# Patient Record
Sex: Female | Born: 1955 | Race: White | Hispanic: No | Marital: Married | Smoking: Never smoker
Health system: Southern US, Academic
[De-identification: ages and names within clinical notes are randomized; demographics above are authoritative.]

## PROBLEM LIST (undated history)

## (undated) DIAGNOSIS — E039 Hypothyroidism, unspecified: Secondary | ICD-10-CM

## (undated) DIAGNOSIS — E785 Hyperlipidemia, unspecified: Secondary | ICD-10-CM

## (undated) DIAGNOSIS — I499 Cardiac arrhythmia, unspecified: Secondary | ICD-10-CM

## (undated) DIAGNOSIS — C50911 Malignant neoplasm of unspecified site of right female breast: Secondary | ICD-10-CM

## (undated) DIAGNOSIS — C50919 Malignant neoplasm of unspecified site of unspecified female breast: Secondary | ICD-10-CM

## (undated) DIAGNOSIS — M8580 Other specified disorders of bone density and structure, unspecified site: Secondary | ICD-10-CM

## (undated) HISTORY — DX: Hyperlipidemia, unspecified: E78.5

## (undated) HISTORY — PX: HX BREAST BIOPSY: SHX20

## (undated) HISTORY — DX: Cardiac arrhythmia, unspecified: I49.9

## (undated) HISTORY — DX: Other specified disorders of bone density and structure, unspecified site: M85.80

## (undated) HISTORY — PX: HX BREAST LUMPECTOMY: SHX2

## (undated) HISTORY — DX: Hypothyroidism, unspecified: E03.9

## (undated) HISTORY — DX: Malignant neoplasm of unspecified site of right female breast: C50.911

## (undated) HISTORY — PX: HX BREAST REDUCTION: SHX8

---

## 1995-10-31 ENCOUNTER — Other Ambulatory Visit (HOSPITAL_COMMUNITY): Payer: Self-pay | Admitting: OBSTETRICS/GYNECOLOGY

## 2014-09-08 IMAGING — MG MAMMO DIGITAL SCREENING
1 series · 8 of 8 positions shown · non-contrast
Comparison: 

------------- REPORT [id number] -------------
RYABOVA, ELICAVET

MAMMO DIGITAL SCREENING WITH CAD
TIGER, BASHAIER
Exam:  
Screening digital mammogram with CAD
INDICATION: Annual screening.

[R CC · oblique · right · 8 of 11 slices shown]
[im 1/11]
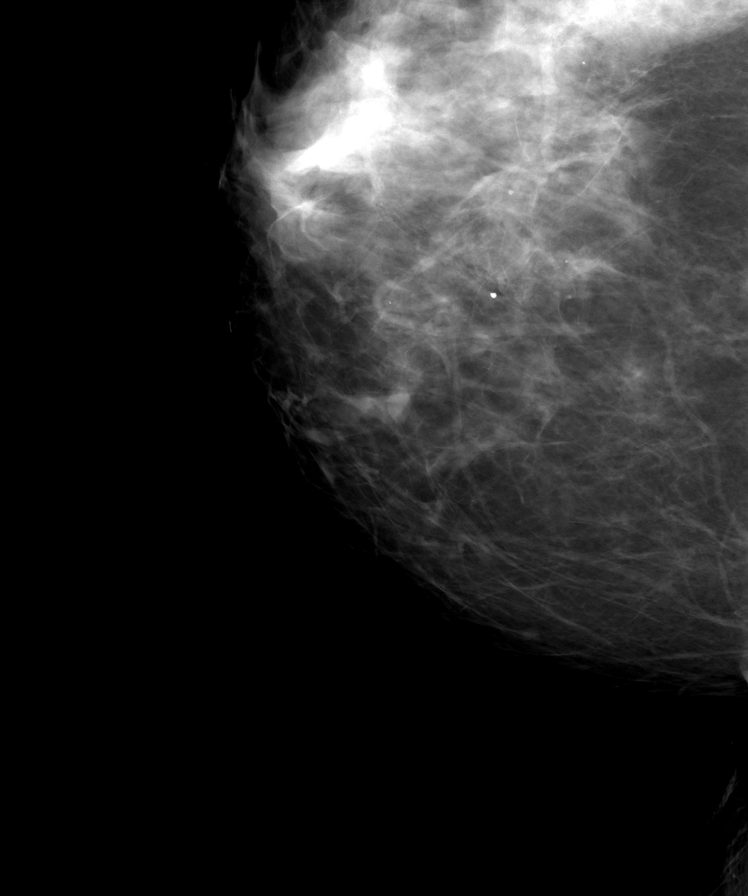
[im 2/11]
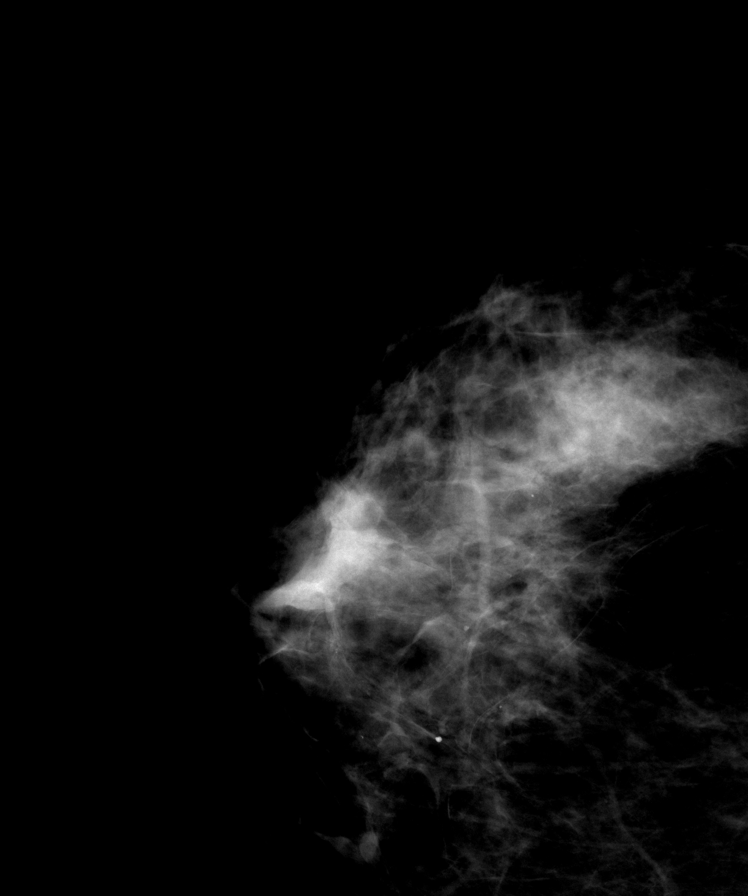
[im 3/11]
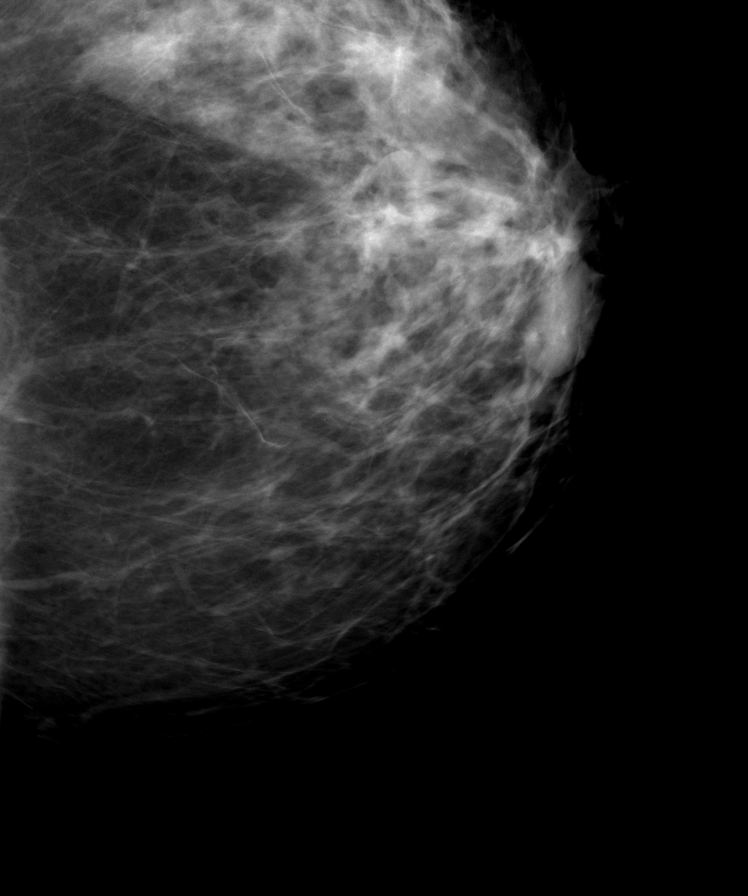
[im 5/11]
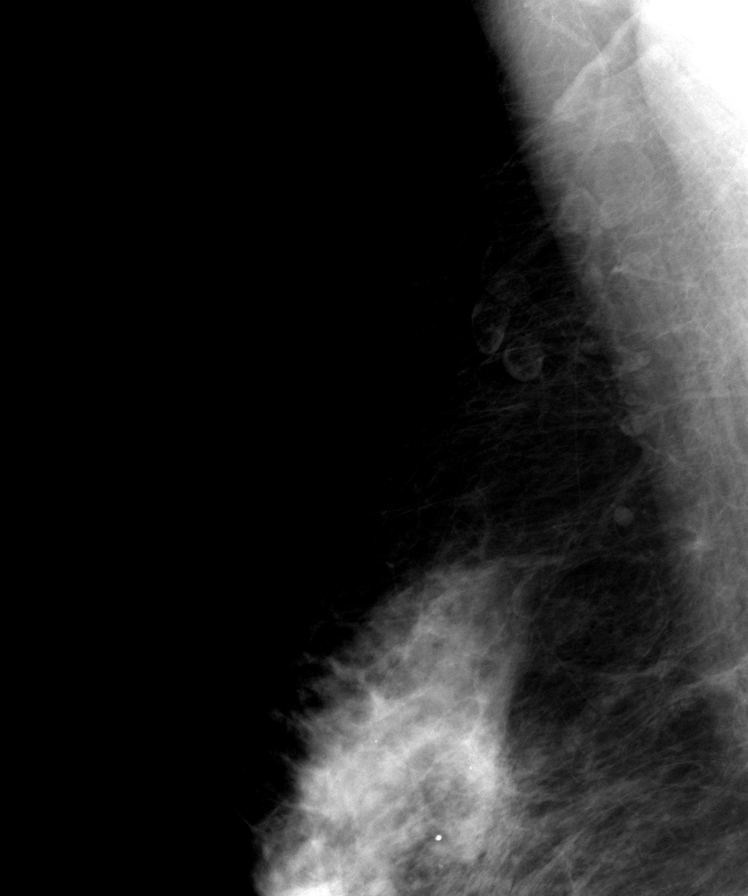
[im 6/11]
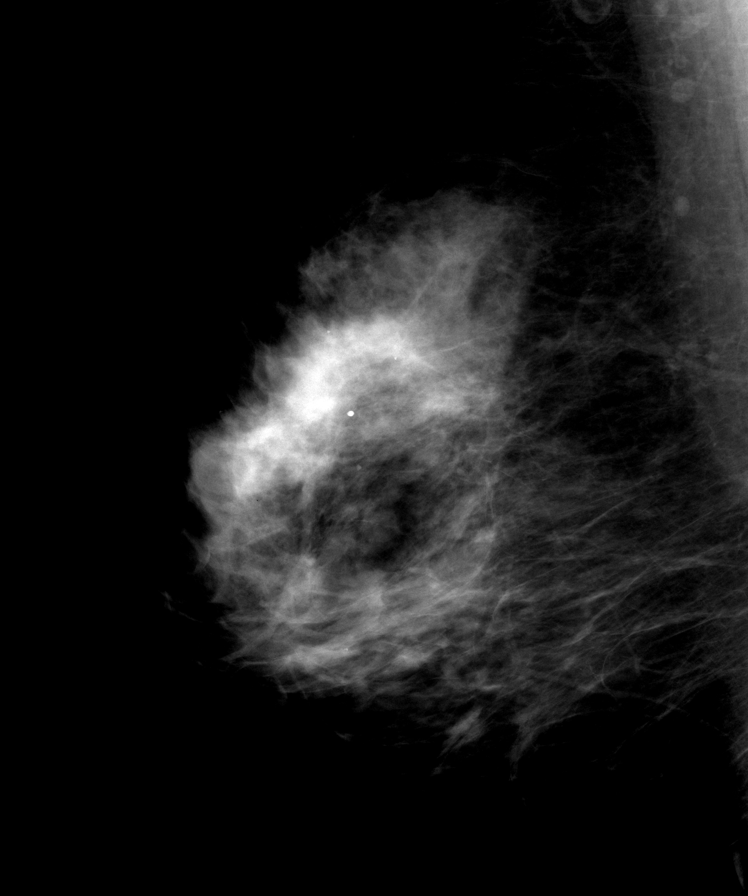
[im 8/11]
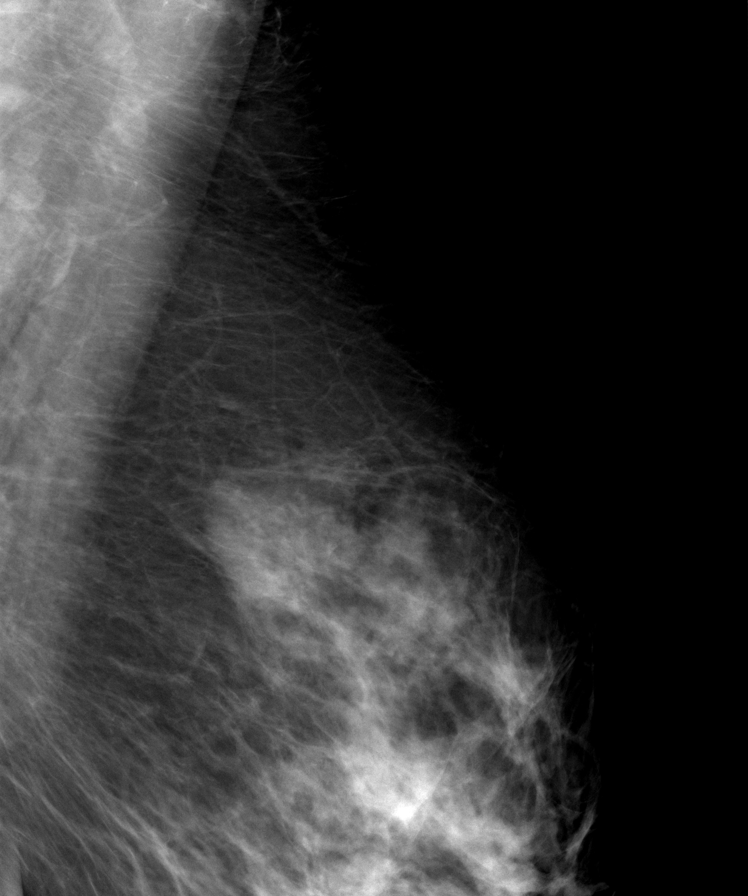
[im 9/11]
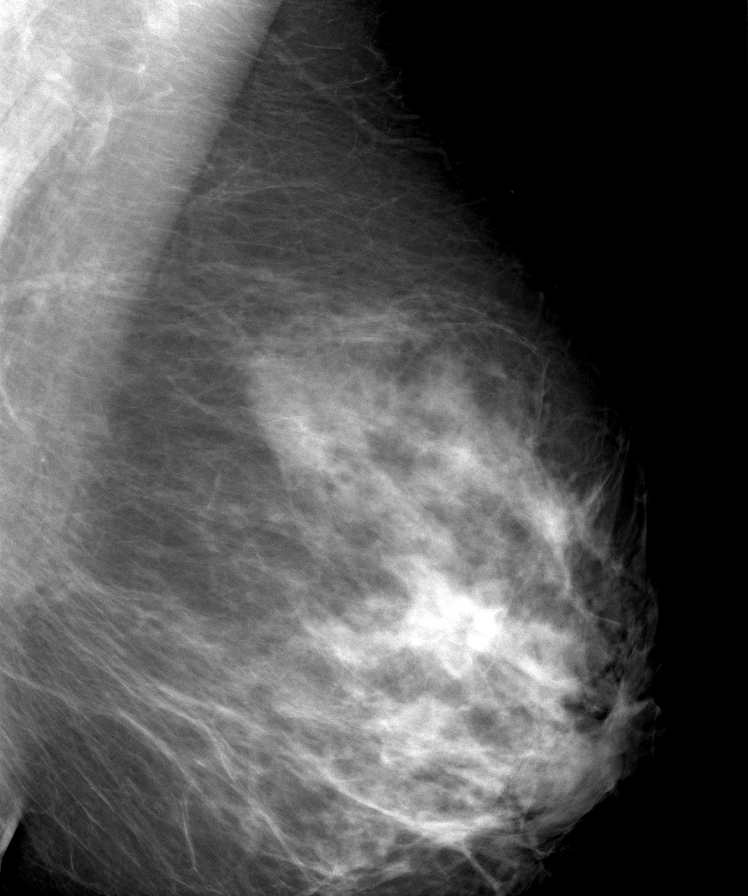
[im 11/11]
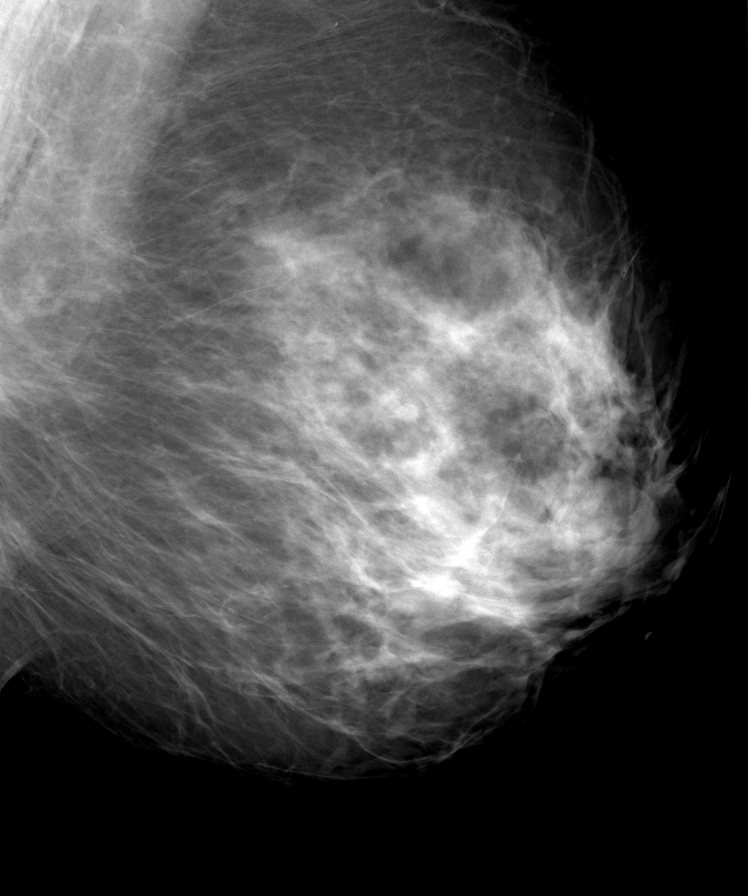

[8 of 8 positions shown; findings below may reference images not displayed]

FINDINGS: Breast parenchyma is heterogeneously dense. There is no mass or suspicious cluster of microcalcifications. There is no architectural distortion, skin thickening or nipple retraction.
IMPRESSION: 1.
BIRADS 2-Benign findings. Patient has been added in a reminder system with a
target date for the next screening mammography.
2.
DENSITY CODE   C (Heterogeneously dense)
Final Assessment Code:
Bi-Rads 2 

BI-RADS 0
Need additional imaging evaluation
BI-RADS 1
Negative mammogram
BI-RADS 2
Benign finding
BI-RADS 3
Probably benign finding: short-interval follow-up suggested
BI-RADS 4
Suspicious abnormality:  biopsy should be considered
BI-RADS 5
Highly suggestive of malignancy; appropriate action should be taken
BI-RADS 6
Known Biopsy-proven Malignancy  Appropriate action should be taken
NOTE:
In compliance with Federal regulations, the results of this mammogram are being sent to the patient.

------------- REPORT [id number] -------------
[facility]
[address]
Mor Ms.KRISHNA, MALAMA:
We wish to report the following on your recent mammography examination. We are sending a report to your referring physician or other health care provider. 
(       Normal/Negative:
No evidence of cancer.
This statement is mandated by the [place], Department of Health.
Your examination was performed by one of our technologists, who are registered radiological technologists and also specially certified in mammography:
___
Donjuan, Wavy (M)
___
Tena, Danielf (M)
___
Gia, Nelith (M)

Your mammogram was interpreted by our radiologist.

( 
Mmamontsho Seakolo, M.D.

(Annual Breast Examination by a physician or other health care provider
(Annual Mammography Screening beginning at age 40
(Monthly Breast Self Examination

## 2015-10-08 IMAGING — MG MAMMO DIGITAL SCREENING
1 series · 4 of 4 positions shown · non-contrast
Comparison: 05/22/2015.

------------- REPORT [id number] -------------
SCRIBNER, SAMEENA

GUIDI, JEDEDIAH
INDICATION: Annual.

[Series 323: R CC · oblique · right · 4 of 4 slices shown]
[im 1/4]
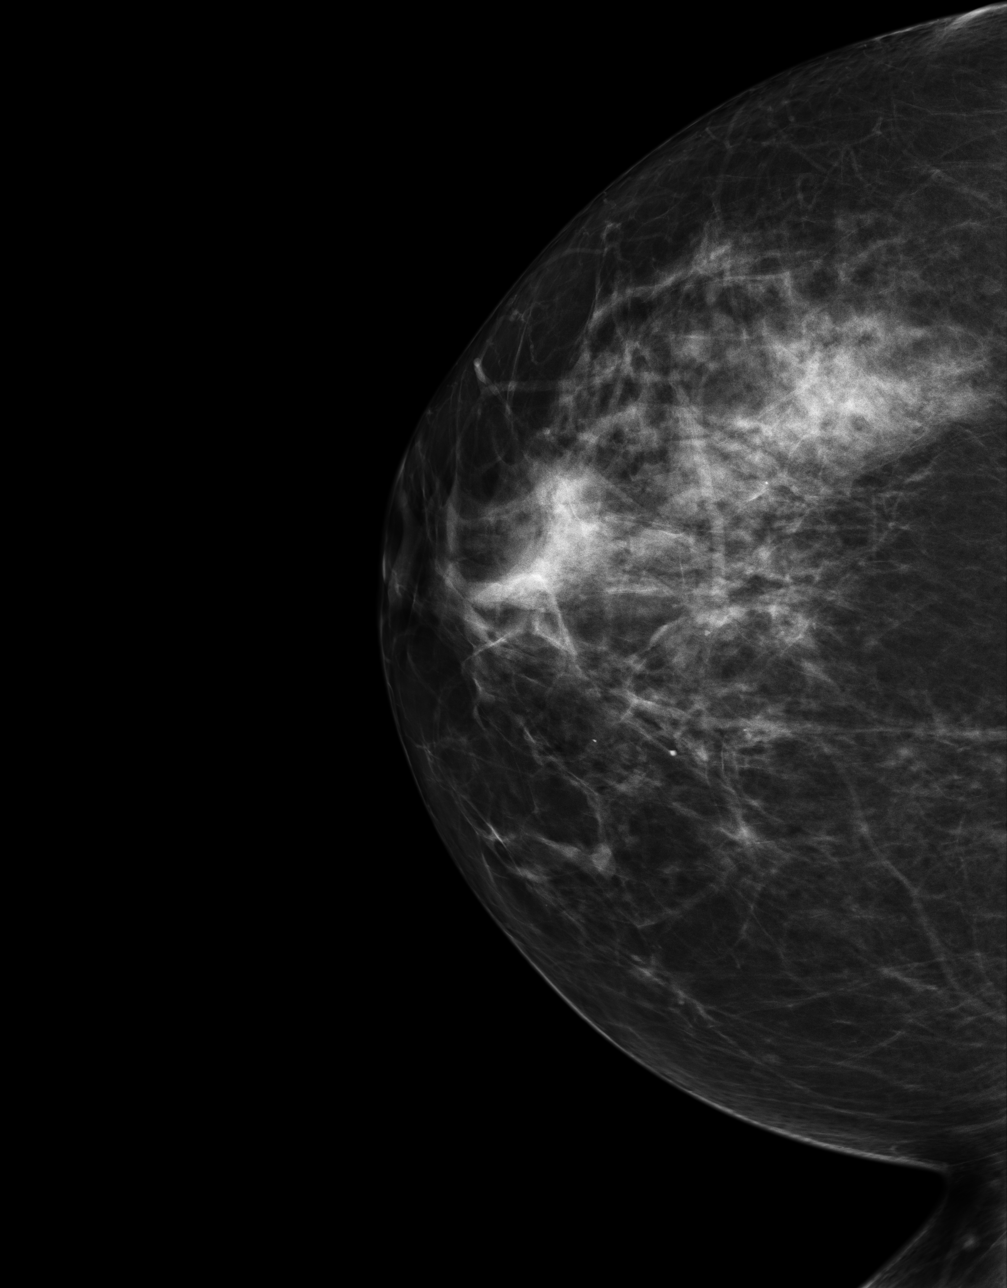
[im 2/4]
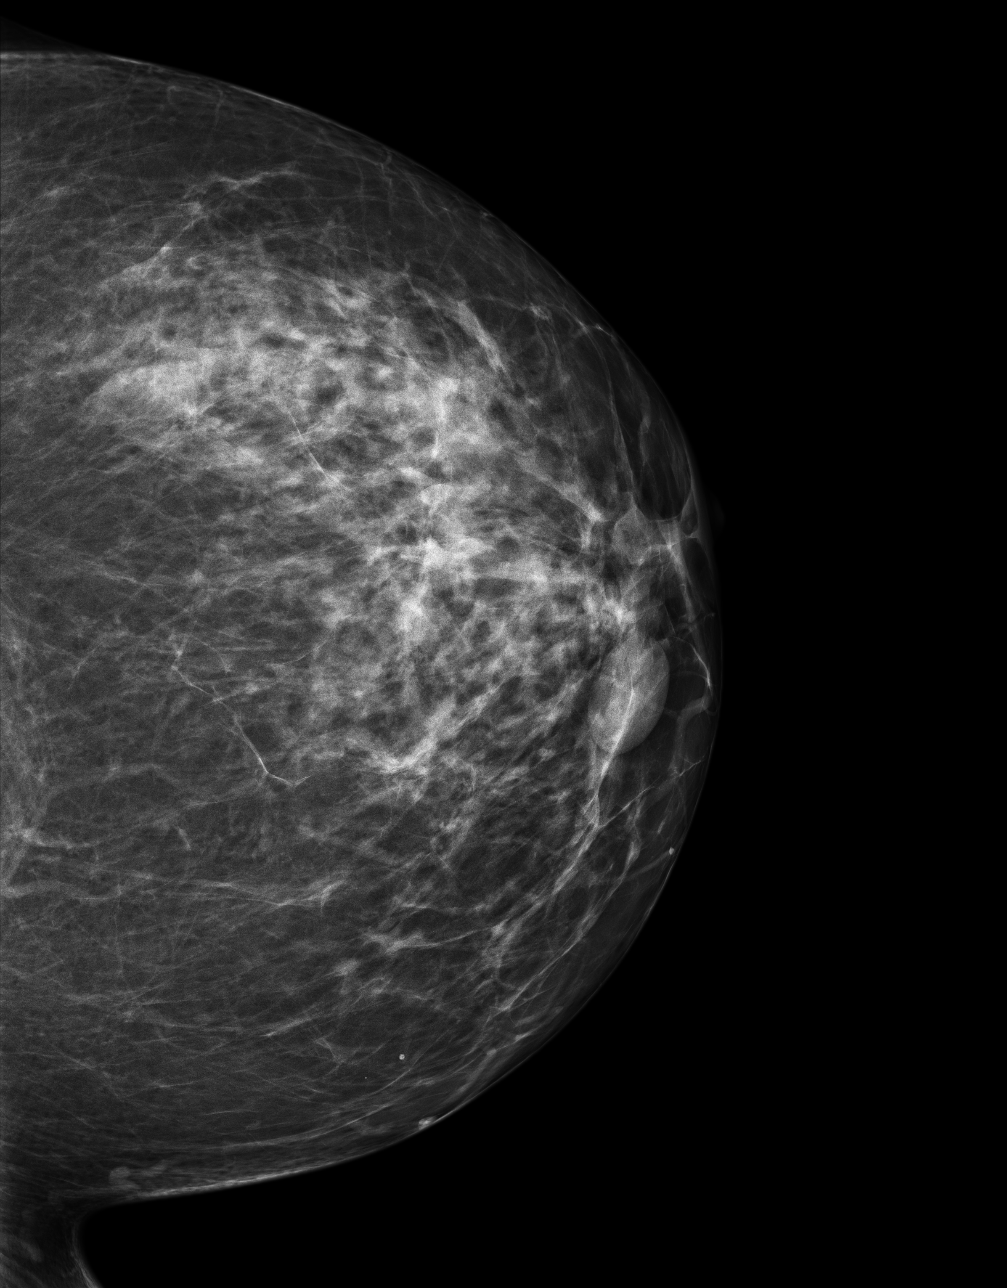
[im 3/4]
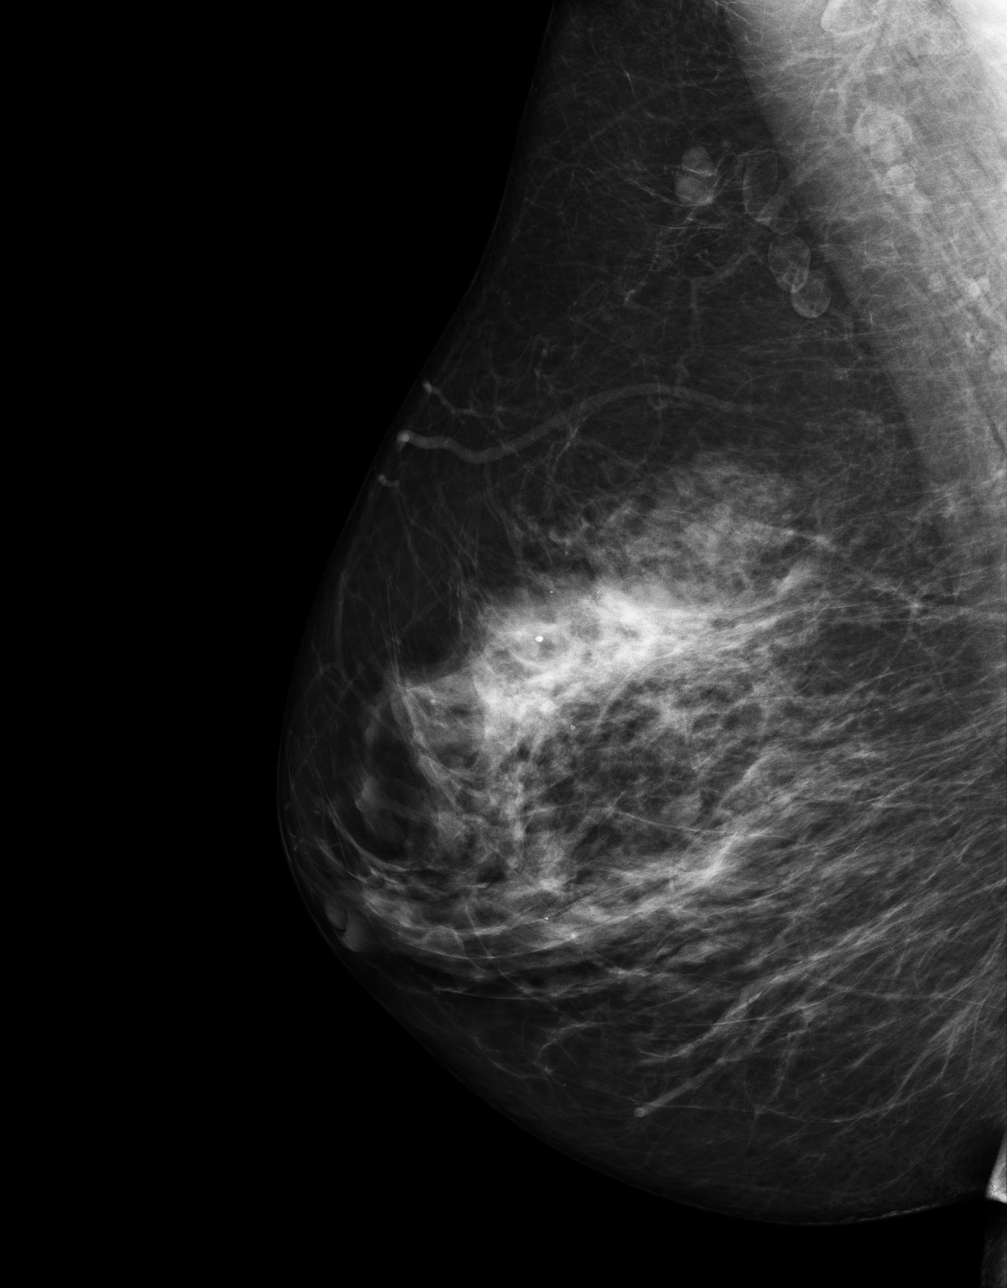
[im 4/4]
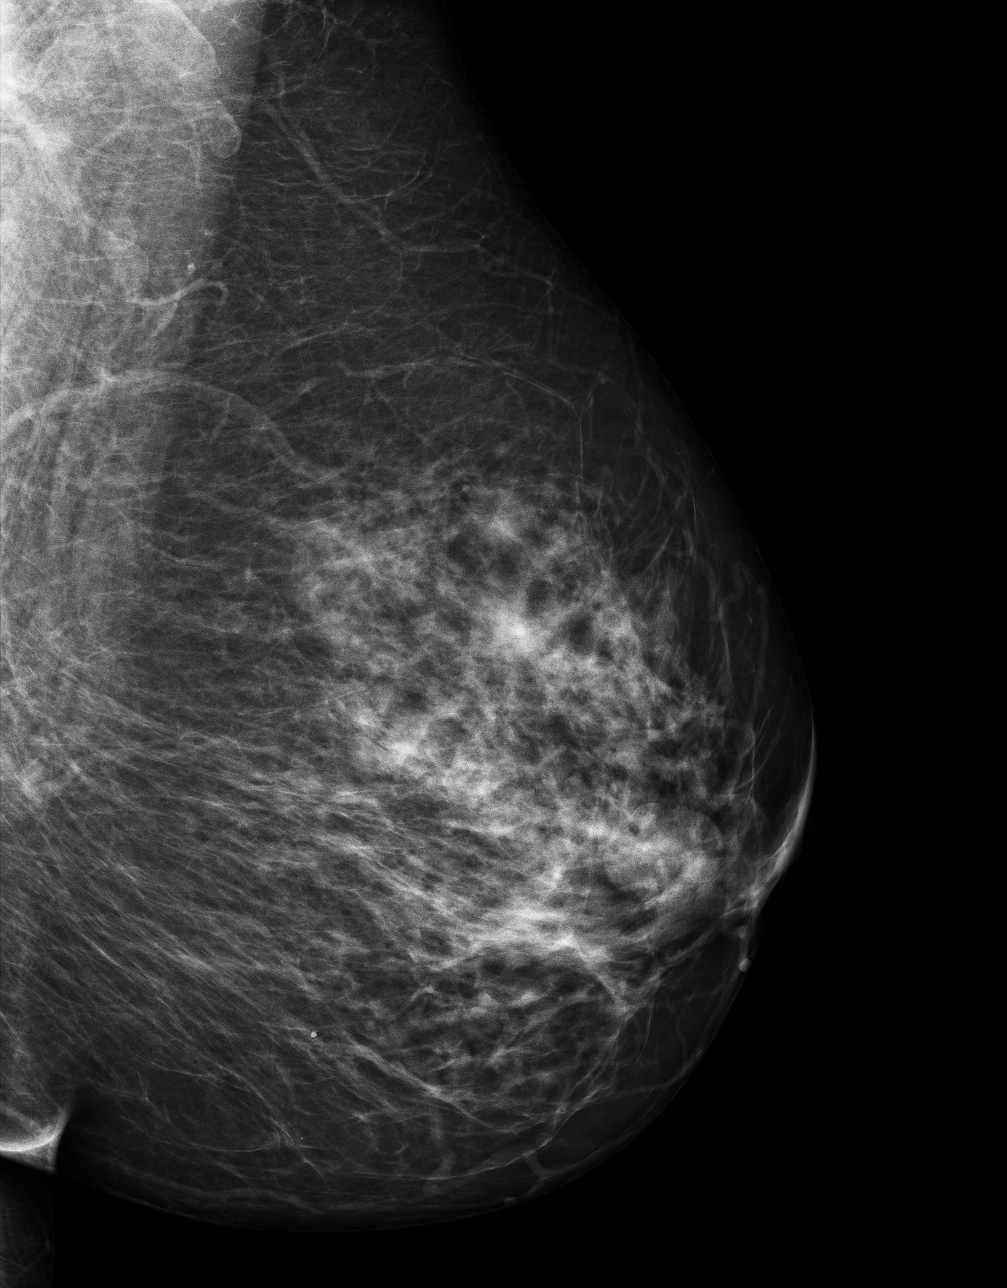

[4 of 4 positions shown; findings below may reference images not displayed]

FINDINGS: Breast parenchyma is heterogeneously dense.  There is no mass or suspicious cluster of microcalcifications.  There is no architectural distortion, skin thickening or nipple retraction.
IMPRESSION: 1.
BIRADS 2-Benign findings. Patient has been added in a reminder system with a
target date for the next screening mammography.
2.
DENSITY CODE   C (Heterogeneously dense).

Final Assessment Code:
Bi-Rads 2

BI-RADS 0
Need additional imaging evaluation
BI-RADS 1
Negative mammogram
BI-RADS 2
Benign finding
BI-RADS 3
Probably benign finding: short-interval follow-up suggested
BI-RADS 4
Suspicious abnormality:  biopsy should be considered
BI-RADS 5
Highly suggestive of malignancy; appropriate action should be taken
BI-RADS 6
Known Biopsy-proven Malignancy  Appropriate action should be taken
NOTE:
In compliance with Federal regulations, the results of this mammogram are being sent to the patient.

------------- REPORT [id number] -------------
[facility]
[address]
Daina Ms.ANOLD, PHILANI THOBO:
We wish to report the following on your recent mammography examination. We are sending a report to your referring physician or other health care provider. 
(       Normal/Negative:
No evidence of cancer.
This statement is mandated by the [place], Department of Health.
Your examination was performed by one of our technologists, who are registered radiological technologists and also specially certified in mammography:
___
Parlak, Edaly (M)
___
Dang, Mcalex (M)

Your mammogram was interpreted by our radiologist.

( 
Sofeine Made, M.D.

(Annual Breast Examination by a physician or other health care provider
(Annual Mammography Screening beginning at age 40
(Monthly Breast Self Examination

## 2016-10-10 IMAGING — MG 3D SCREENING MAMMO BIL W/CAD
5 series · 7 of 24 positions shown · non-contrast
Comparison: 06/30/2017 and 05/31/2016.

------------- REPORT [id number] -------------
JUSINO, HARUKA

Exam:  
Annual screening digital mammogram with 3D Tomosynthesis with CAD
INDICATION: Screening.

[R CC · right · 0.10mm/px · 2 of 3 slices shown]
[im 1/3]
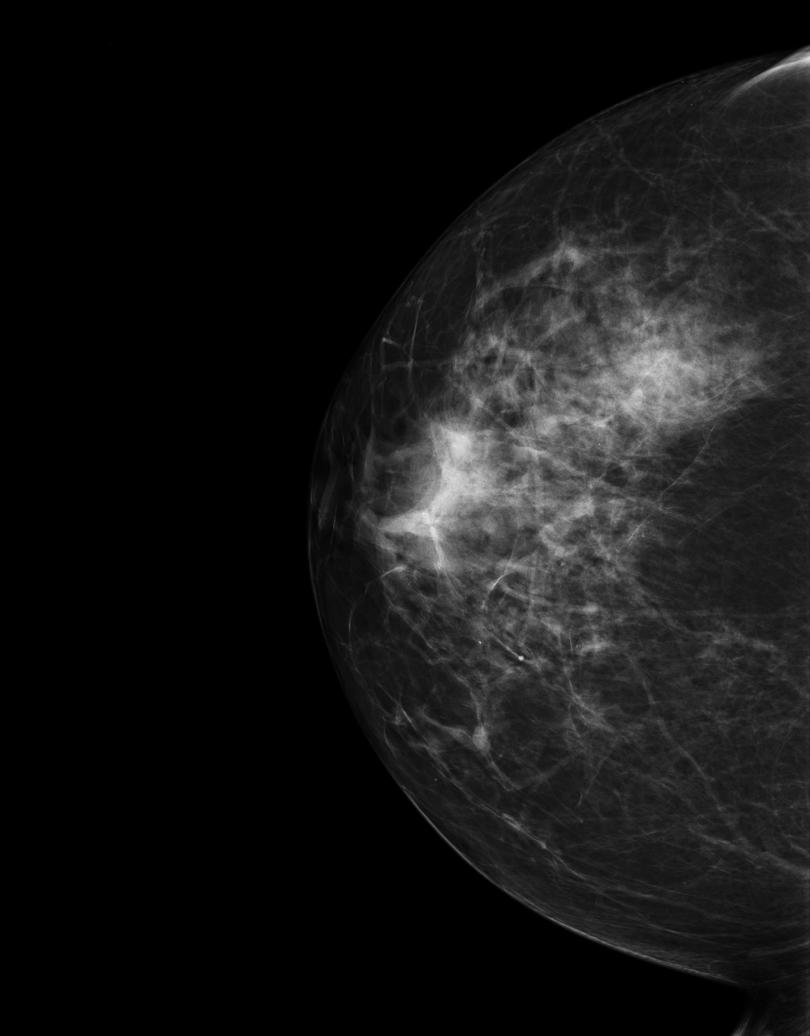
[im 3/3]
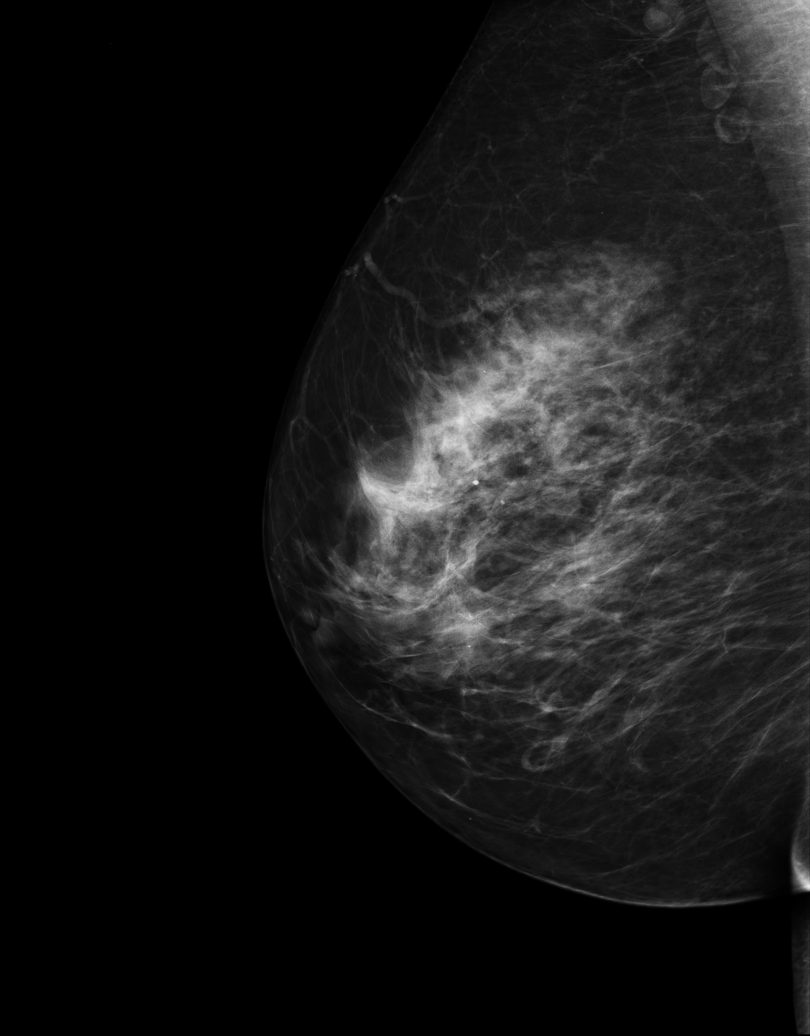

[3D SCREENING MAMMO BIL W/CAD · 2 acquisitions, 2 frames shown (1 of 2)]
[im 1/2]
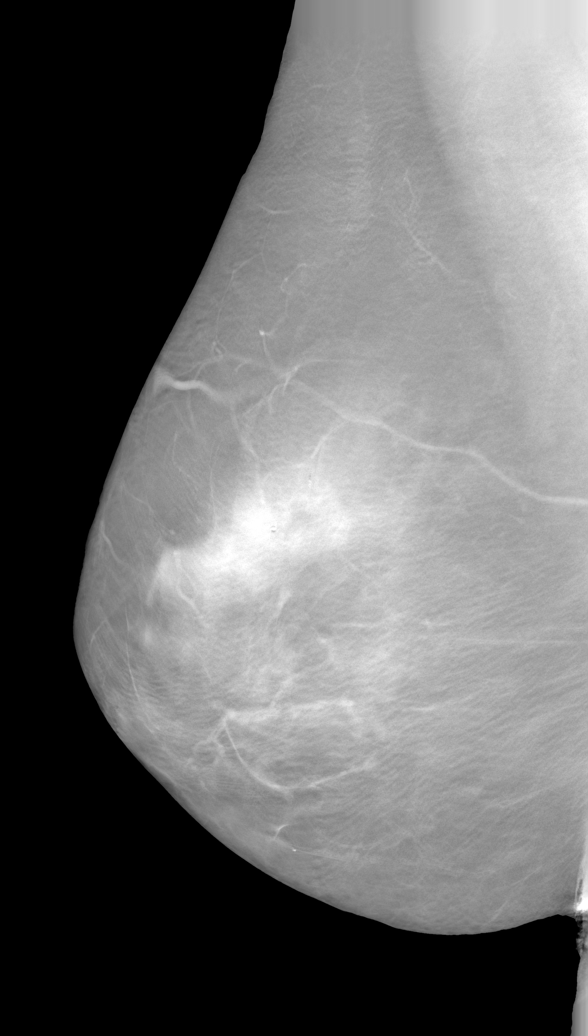
[im 2/2]
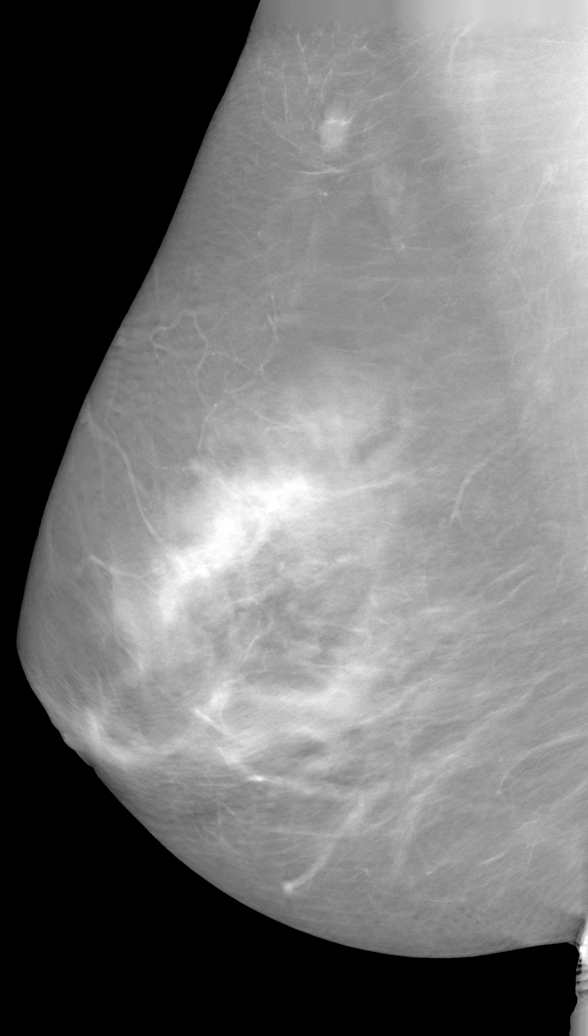

[R]
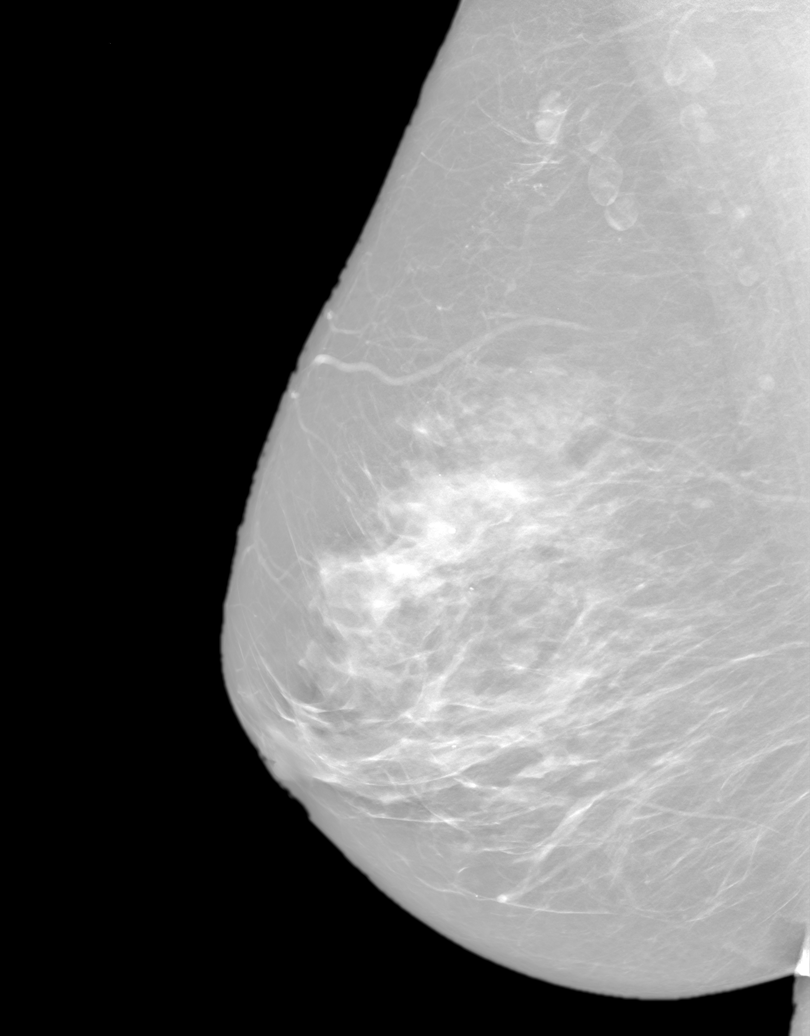

[3D SCREENING MAMMO BIL W/CAD (2 of 2) · tomo slice 16/102.0]
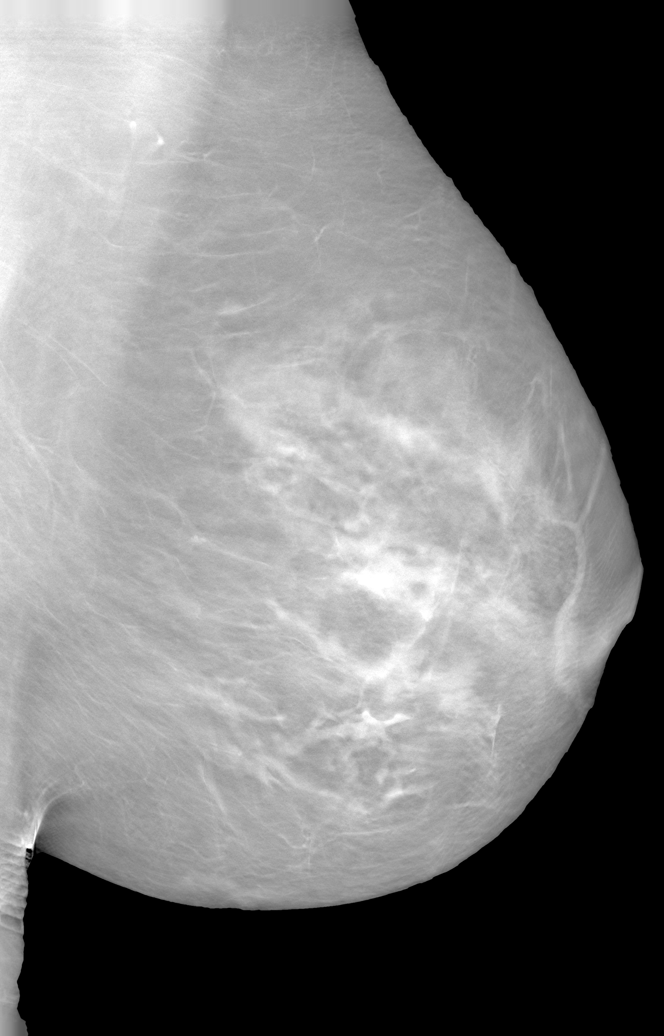

[L]
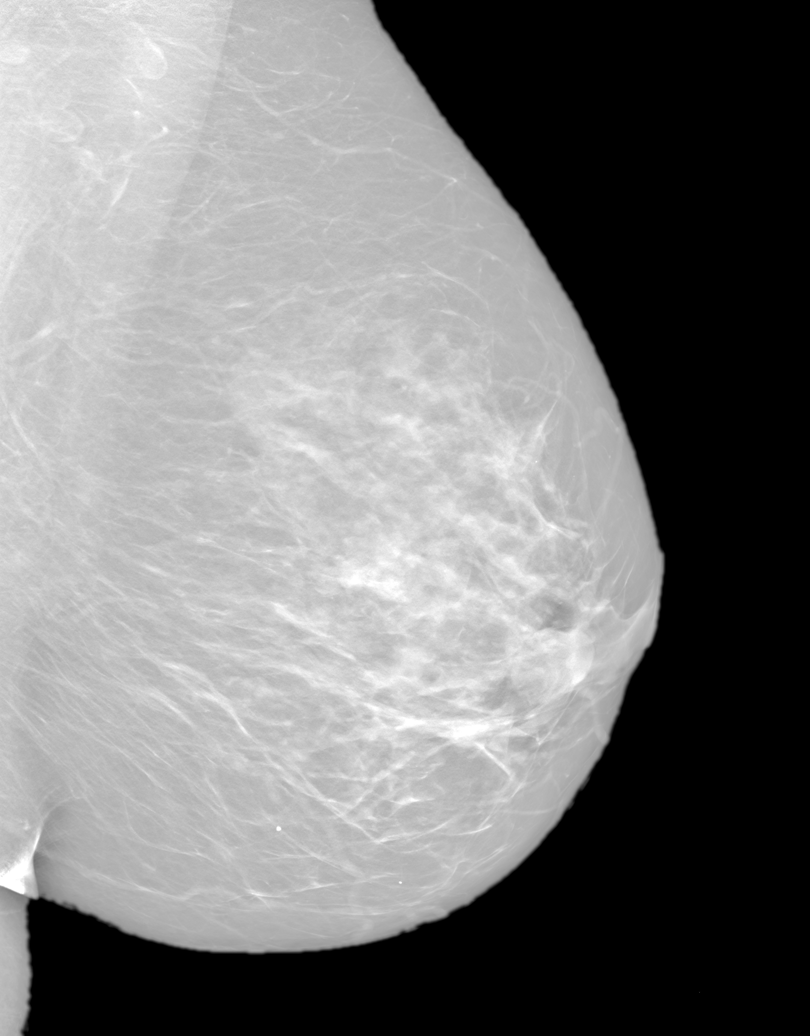

[7 of 24 positions shown; findings below may reference images not displayed]

FINDINGS: Breast parenchyma is heterogeneously dense. There is no mass or suspicious cluster of microcalcifications. There is no architectural distortion, skin thickening or nipple retraction.
IMPRESSION: BIRADS 2-Benign findings. Patient has been added in a reminder system with a

target date for the next screening mammography.

DENSITY CODE –  C (Heterogeneously dense). 

Final Assessment Code:

Bi-Rads 2 

BI-RADS 0
Need additional imaging evaluation

BI-RADS 1
Negative mammogram

BI-RADS 2
Benign finding

BI-RADS 3
Probably benign finding: short-interval follow-up suggested

BI-RADS 4
Suspicious abnormality:  biopsy should be considered

BI-RADS 5
Highly suggestive of malignancy; appropriate action should be taken

BI-RADS 6
Known Biopsy-proven Malignancy – Appropriate action should be taken

NOTE:
In compliance with Federal regulations, the results of this mammogram are being sent to the patient.

------------- REPORT [id number] -------------
[facility]
[address]
Daina Ms.ANOLD, PHILANI THOBO:
We wish to report the following on your recent mammography examination. We are sending a report to your referring physician or other health care provider. 
(       Normal/Negative:
No evidence of cancer.
This statement is mandated by the [place], Department of Health.
Your examination was performed by one of our technologists, who are registered radiological technologists and also specially certified in mammography:
___
Parlak, Edaly (M)
___
Dang, Mcalex (M)

Your mammogram was interpreted by our radiologist.

( 
Sofeine Made, M.D.

(Annual Breast Examination by a physician or other health care provider
(Annual Mammography Screening beginning at age 40
(Monthly Breast Self Examination

## 2017-10-12 IMAGING — MG 3D SCREENING MAMMO BIL W/CAD
6 series · 6 of 24 positions shown · non-contrast
Comparison: 07/03/2017 and 06/30/2016.

------------- REPORT [id number] -------------
PATIENT REGISTRATION FORM

Scheduled Modality:
MG
Date Scheduled:
Referring Physician: 
PATIENT INFORMATION
 Mr.
 Mrs.
 Miss  Ms.
Email address:
[place], [place]
Social Security:  
Age:
Sex:
F
Mailing Address:
Home Phone
Cell Phone 
Study Type:
Diagnosis / Symptoms:
Insurance Authorization:
prev [facility] [place] routine
Notes/Special Instructions: 
INSURANCE INFORMATION
(Please give your insurance card to the receptionist.)
Name of  primary insurance
[employer] [place]
Subscriber’s S.S. #
Group #
Insurance ID # 
Co-payment:
$
Patient’s relationship to subscriber:
 Self
 Spouse
 Child
 Other
Name of secondary insurance (if applicable):
Insurance ID #
IN CASE OF EMERGENCY
Name of friend or relative to call in case of emergency:
Relationship to patient:
Home phone #
Work phone #
The above information is true to the best of my knowledge. I authorize my insurance benefits be paid directly to the physician. I understand that I am financially responsible for any balance. I also authorize [employer] or insurance company to release any information required to process my claims.
Patient/Guardian signature
Date
------------- REPORT [id number] -------------
[facility]
[address]
Tiger Ms.TIDONA, BOCCIA:
We wish to report the following on your recent mammography examination. We are sending a report to your referring physician or other health care provider. 
(       Abnormal: There is a finding on your mammogram that requires further tests for a more thorough evaluation. You should contact your referring physician as soon as possible (if you have not already done so).
This statement is mandated by the [place], Department of Health.
Your examination was performed by one of our technologists, who are registered radiological technologists and also specially certified in mammography:
___
Ekram, Deare (M)
Ballou, Nilam (M)
Your mammogram was interpreted by our radiologist.
( 
Maria Elena Tomas, M.D.
(Annual Breast Examination by a physician or other health care provider
(Annual Mammography Screening beginning at age 40
(Monthly Breast Self Examination
------------- REPORT [id number] -------------
DINGANGA, MANUEL JAMBA
EXAM:  3D BILATERAL ANNUAL SCREENING DIGITAL MAMMOGRAM WITH TOMOSYNTHESIS AND CAD
INDICATION: Screening.

[R CC]
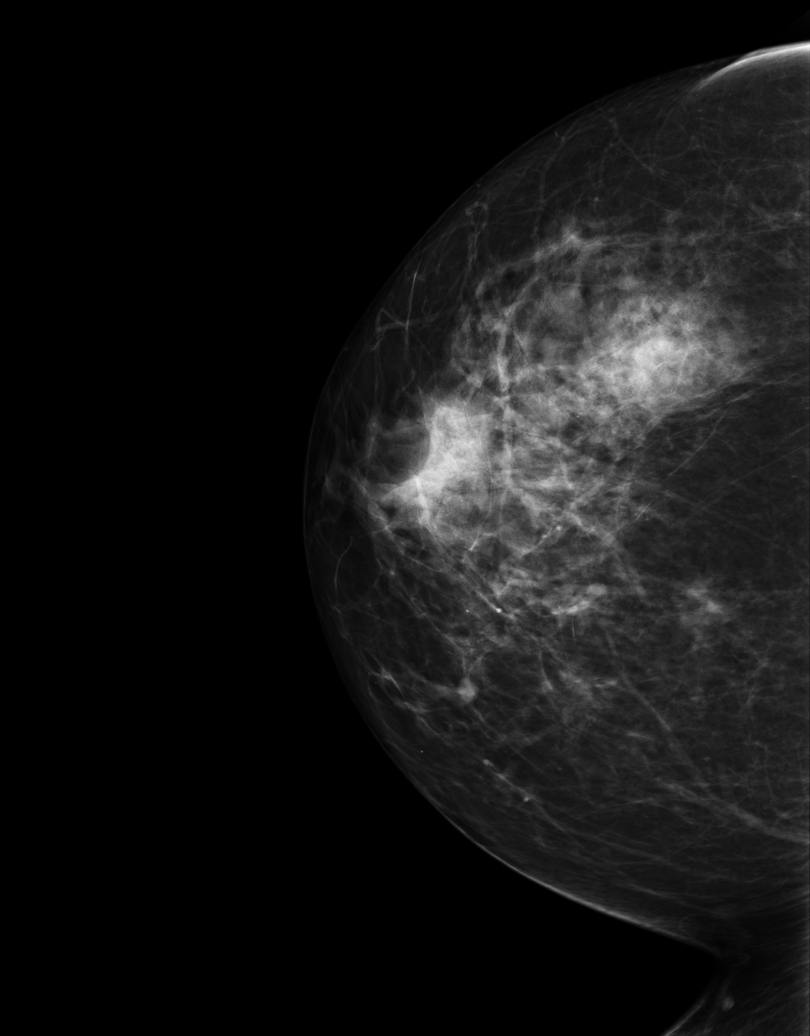

[3D SCREENING MAMMO BIL W/CAD (1 of 2) · tomo slice 15/94.0]
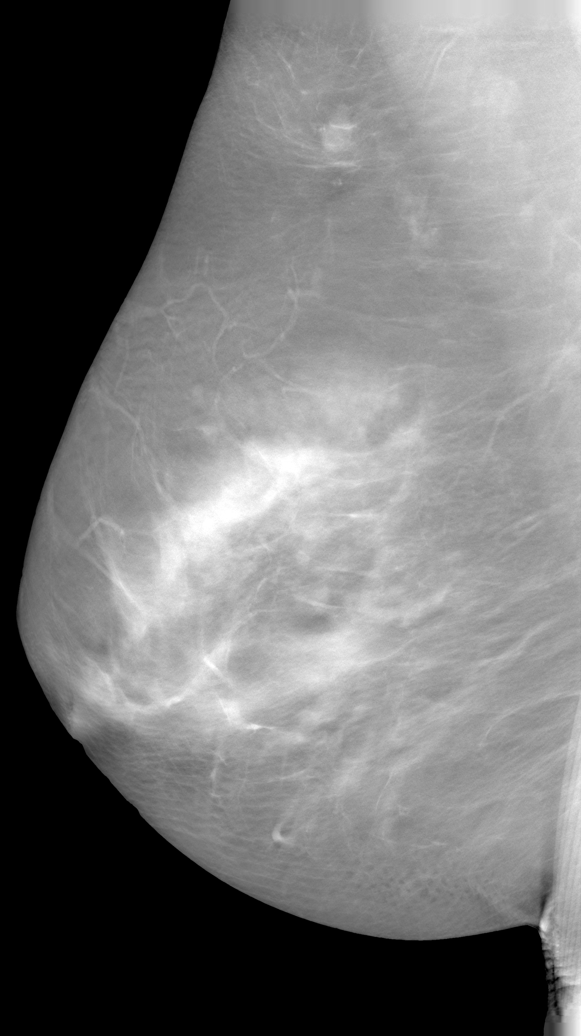

[R]
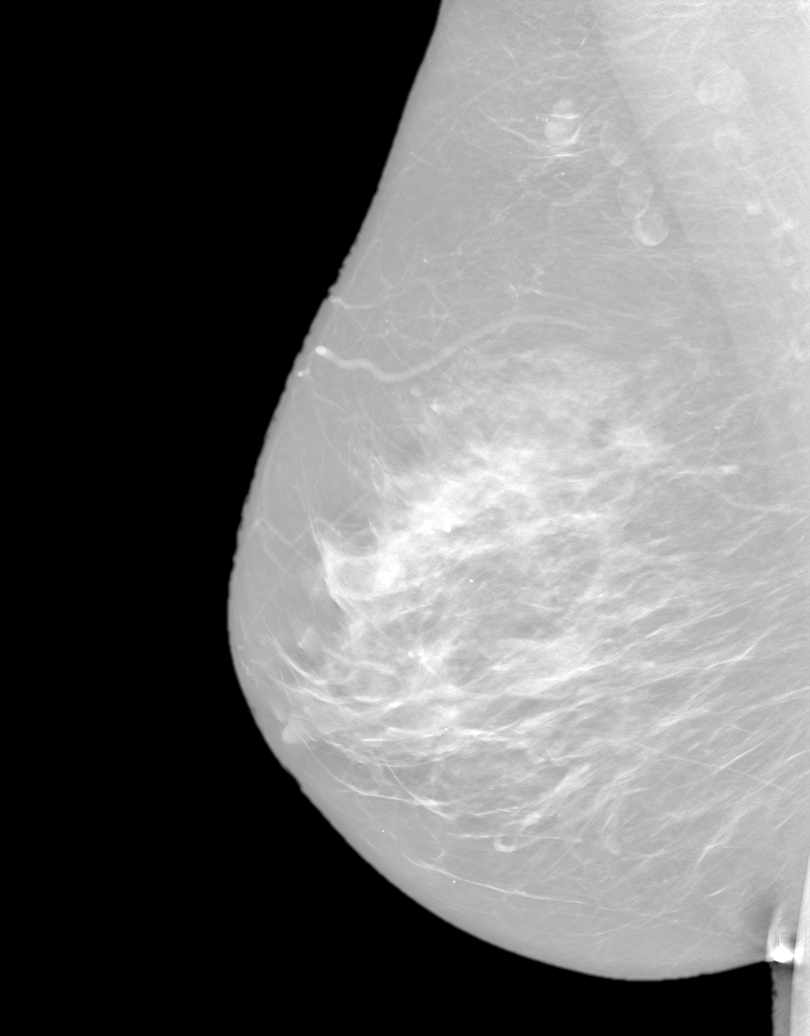

[3D SCREENING MAMMO BIL W/CAD (2 of 2) · tomo slice 16/103.0]
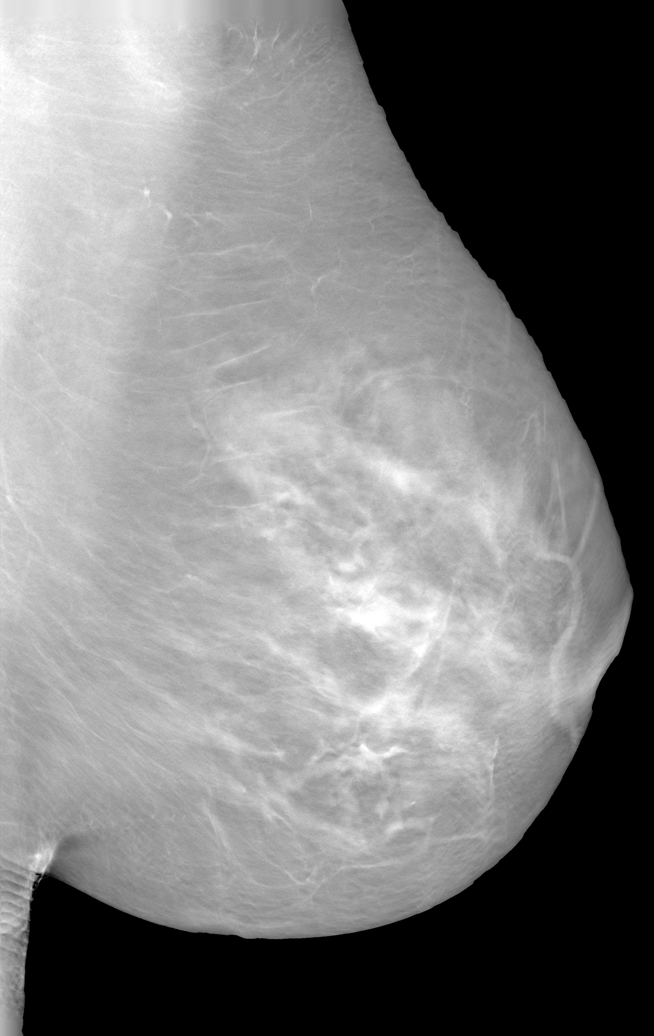

[L]
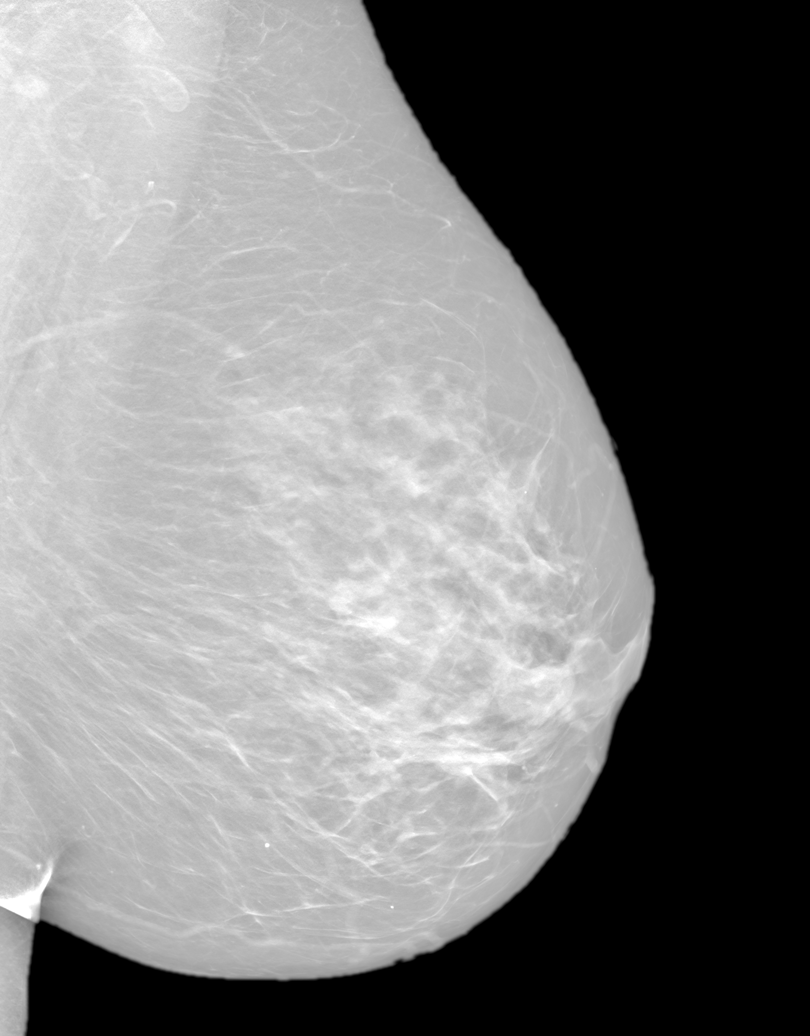

[R MLO]
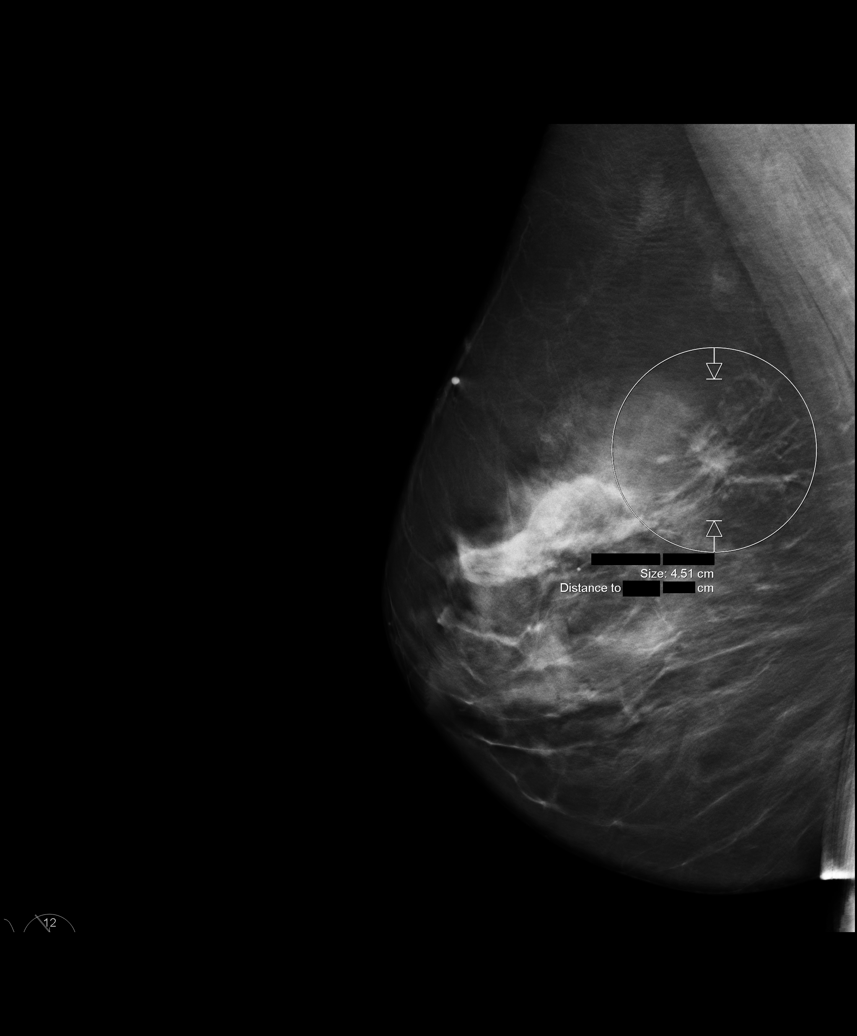

[6 of 24 positions shown; findings below may reference images not displayed]

FINDINGS: Breast parenchyma is heterogeneously dense.  There is suggestion of a new area of architectural distortion within the posterior superior right breast, visualized only on the MLO projection (image 58).  There is no suspicious cluster of microcalcifications, skin thickening or nipple retraction.
IMPRESSION: 1.  BIRADS 0-Need additional imaging evaluation.  Suggestion of a new area of architectural distortion within the posterior superior right breast.  Patient will be called back for further evaluation with spot compression views and ultrasound. 

2.  DENSITY CODE – C (Heterogeneously dense). 

Final Assessment Code:

Bi-Rads 0 

BI-RADS 0
Need additional imaging evaluation

BI-RADS 1
Negative mammogram

BI-RADS 2
Benign finding

BI-RADS 3
Probably benign finding: short-interval follow-up suggested

BI-RADS 4
Suspicious abnormality:  biopsy should be considered

BI-RADS 5
Highly suggestive of malignancy; appropriate action should be taken

BI-RADS 6
Known Biopsy-proven Malignancy – Appropriate action should be taken

NOTE:
In compliance with Federal regulations, the results of this mammogram are being sent to the patient.

## 2017-10-19 IMAGING — US ULTRASOUND BREAST
1 series · 13 of 23 positions shown · non-contrast
Comparison: Mammogram dated 10/12/2017.

MED, SAYDA

EXAM:  2D RIGHT DIAGNOSTIC DIGITAL MAMMOGRAM WITH CAD 
EXAM:  COMPLETE RIGHT BREAST ULTRASOUND
INDICATION: Call back for a new area of architectural distortion within the posterior superior right breast.

[Series 1: ultrasound breast · 0.11mm/px · 13 of 23 slices shown]
[im 1/23]
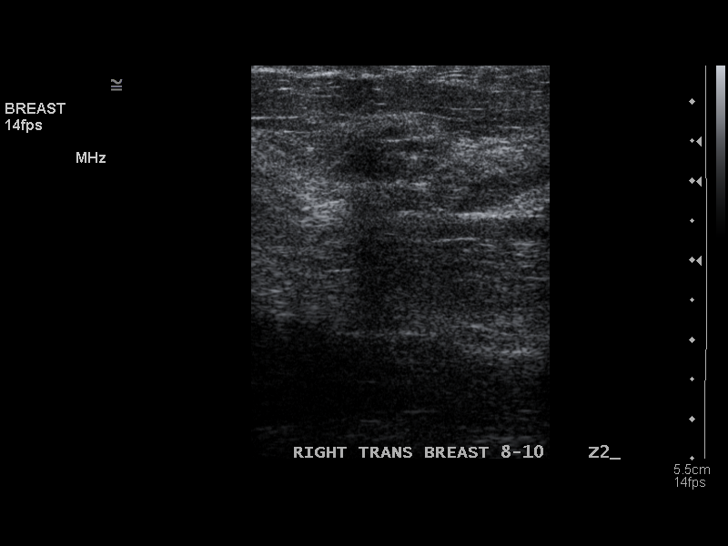
[im 3/23]
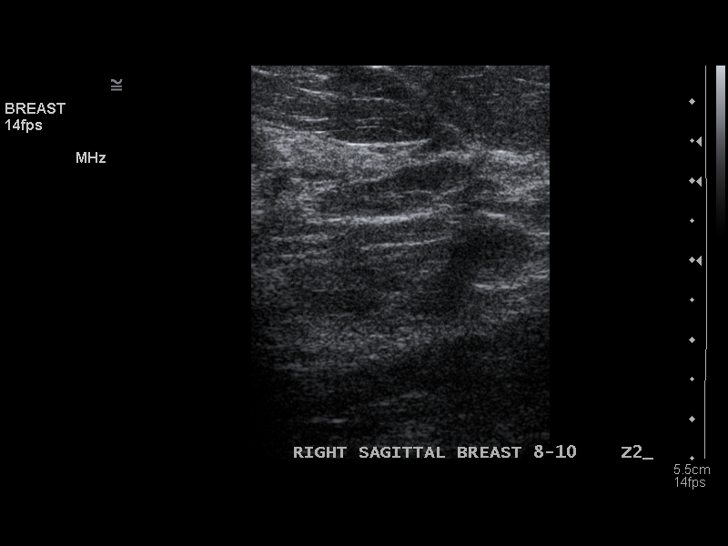
[im 5/23]
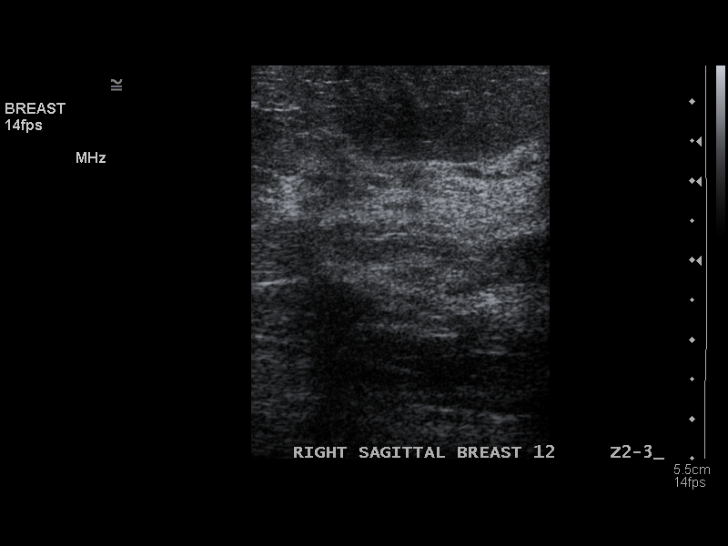
[im 7/23]
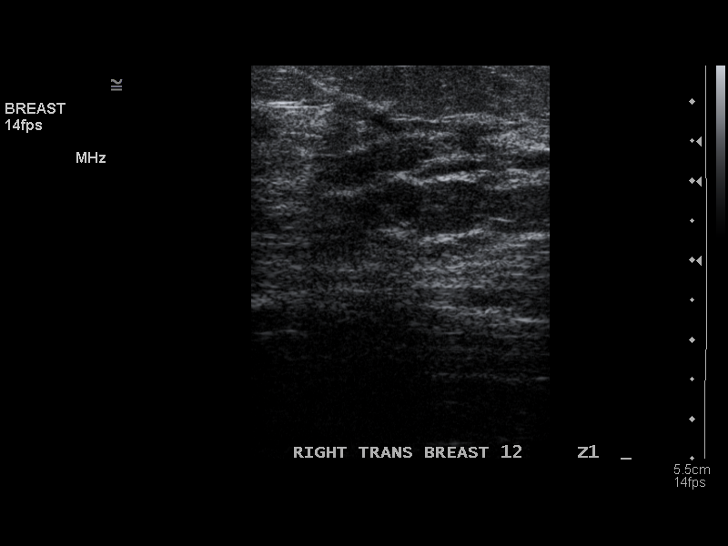
[im 8/23]
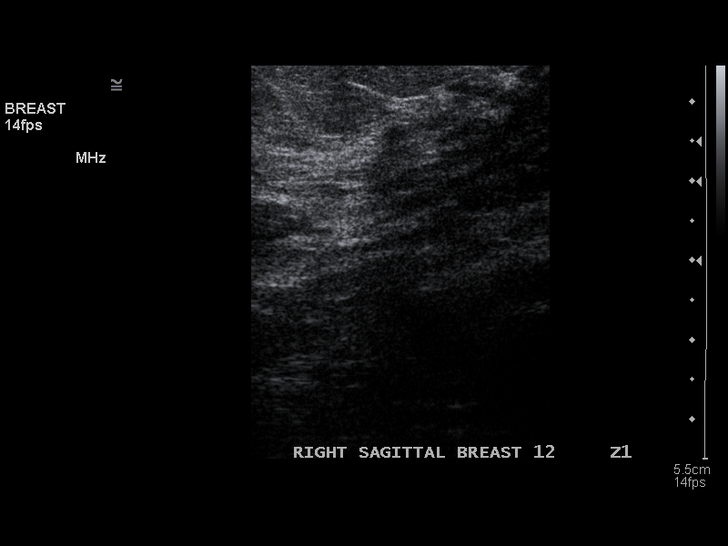
[im 10/23]
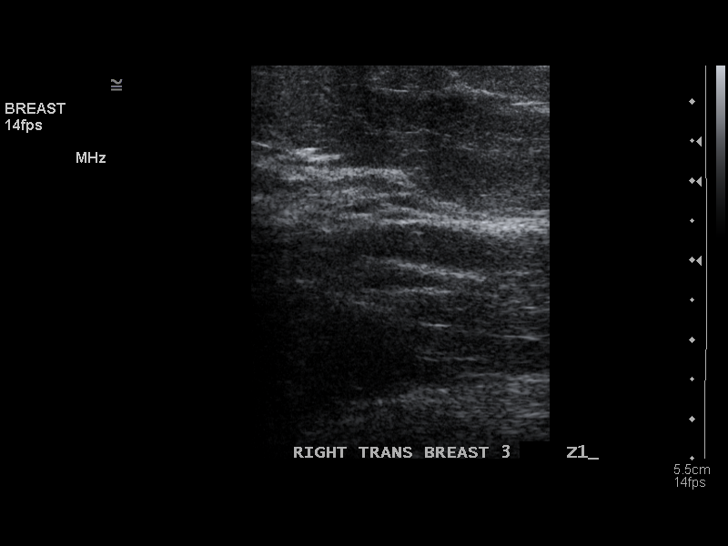
[im 12/23]
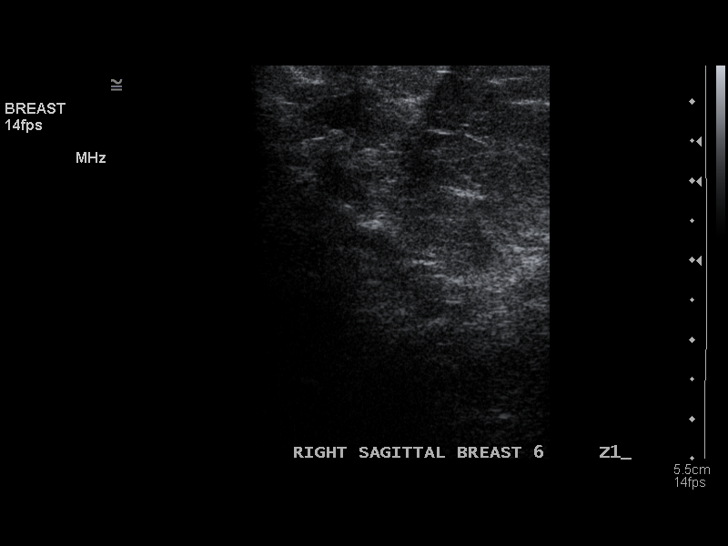
[im 14/23]
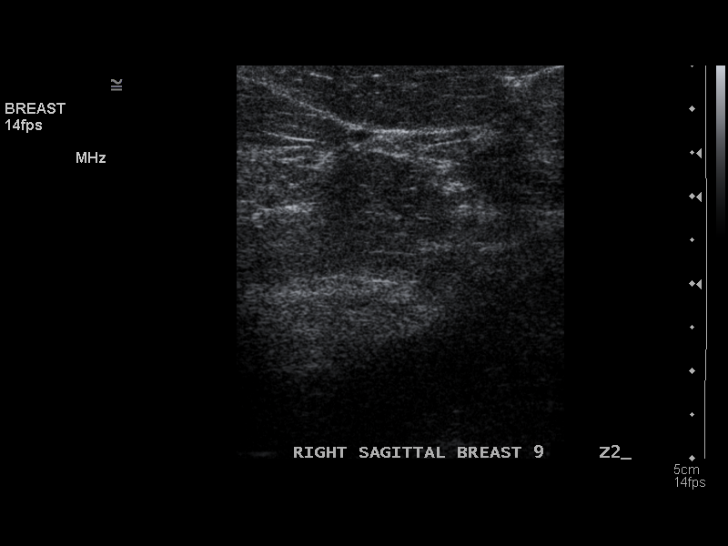
[im 16/23]
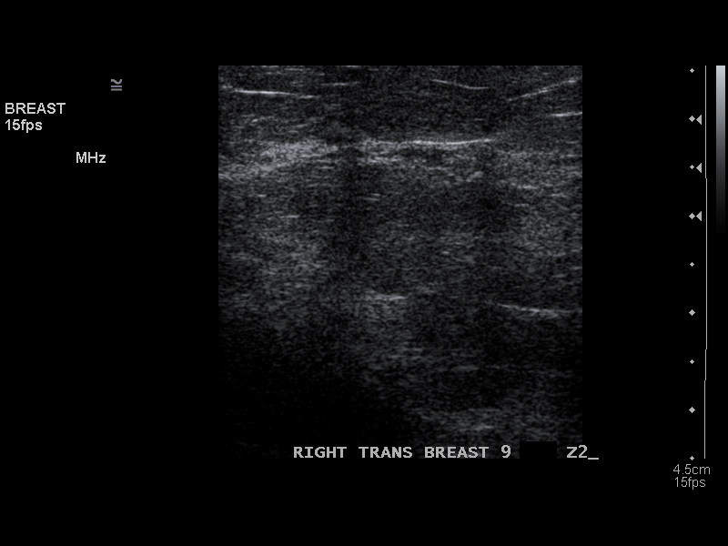
[im 17/23]
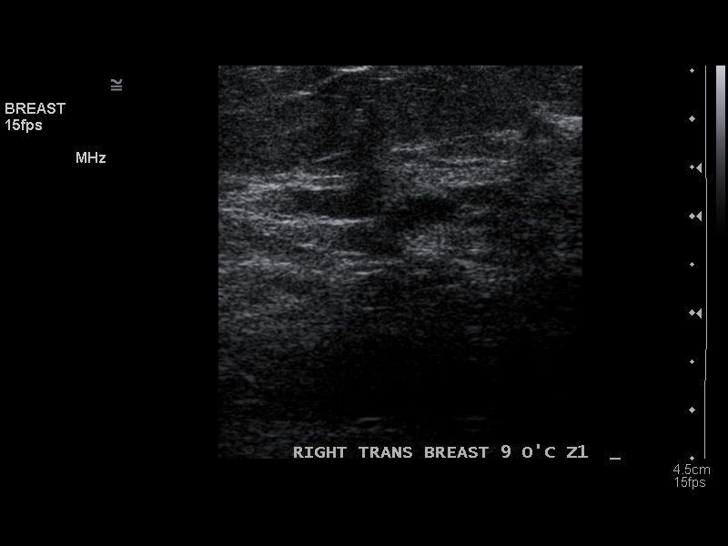
[im 19/23]
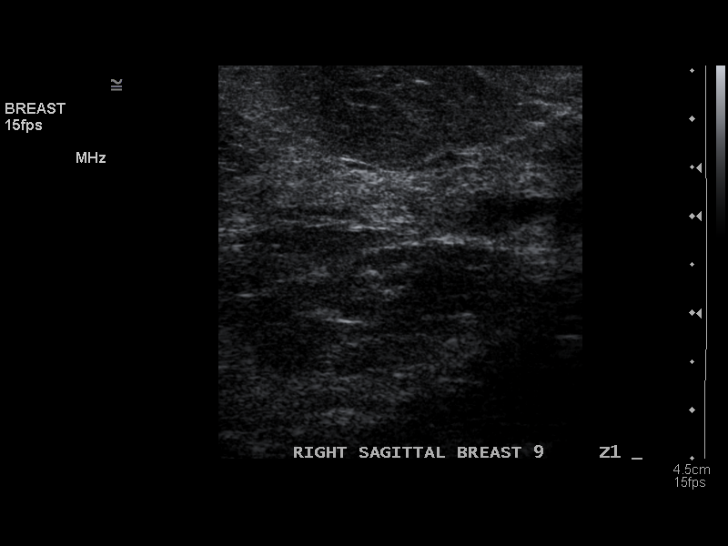
[im 21/23]
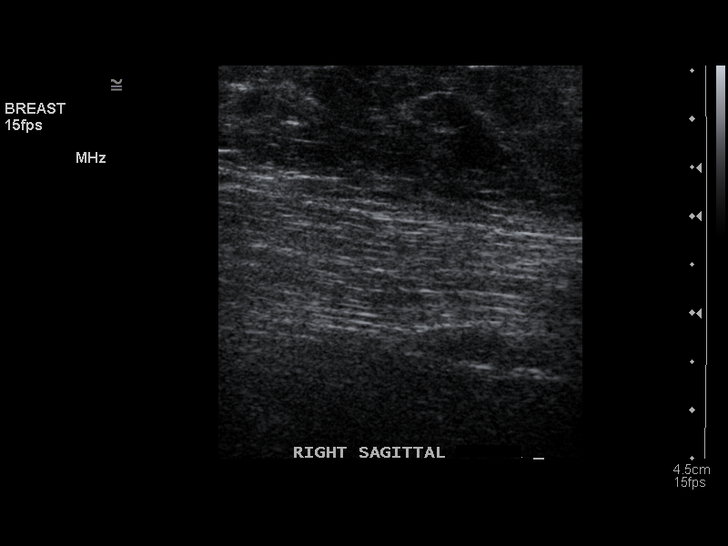
[im 23/23]
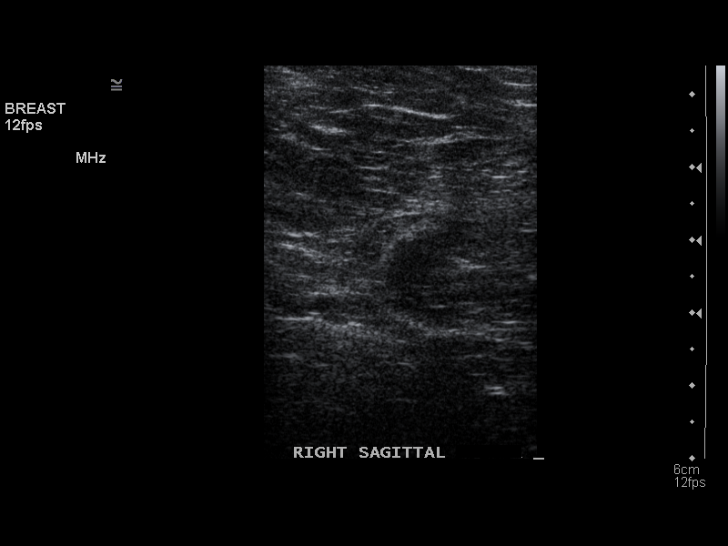

[13 of 23 positions shown; findings below may reference images not displayed]

FINDINGS: Spot compression view of the superior right breast was obtained in MLO projection.  Previously noted area of architectural distortion is less conspicuous.  Breast parenchyma is heterogeneously dense.  

A comprehensive sonographic evaluation of the right breast was also performed and representative images of all four quadrants, retroareolar and axillary regions were obtained.  There is no suspicious solid or cystic mass.  There is no axillary adenopathy.
IMPRESSION: 1.  BIRADS 3-Probably benign findings.  Six-month follow-up right diagnostic mammogram is recommended for reassessment. 

2.  DENSITY CODE –  C (Heterogeneously dense).

Final Assessment Code:

Bi-Rads 3 

BI-RADS 0
Need additional imaging evaluation

BI-RADS 1
Negative mammogram

BI-RADS 2
Benign finding

BI-RADS 3
Probably benign finding: short-interval follow-up suggested

BI-RADS 4
Suspicious abnormality:  biopsy should be considered

BI-RADS 5
Highly suggestive of malignancy; appropriate action should be taken

BI-RADS 6
Known Biopsy-proven Malignancy – Appropriate action should be taken

NOTE:
In compliance with Federal regulations, the results of this mammogram are being sent to the patient.

## 2017-10-19 IMAGING — MG 2D DX MAMMO UNI W/CAD
1 series · 1 of 1 positions shown · non-contrast
Comparison: Mammogram dated 04/08/2019.

------------- REPORT [id number] -------------
PATIENT REGISTRATION FORM

Scheduled Modality:
MG
Date Scheduled:
Referring Physician: 
PATIENT INFORMATION
 Mr.
 Mrs.
 Miss  Ms.
Email address:
[place], [place]
Social Security:  
Age:
Sex:
F
Mailing Address:
Home Phone
Cell Phone 
Study Type:
Diagnosis / Symptoms:
Insurance Authorization:
[employer]DITIONAL VIEWS AND RIGHT BREAST
Notes/Special Instructions: 
INSURANCE INFORMATION
(Please give your insurance card to the receptionist.)
Name of  primary insurance
[employer] [place]
Subscriber’s S.S. #
Group #
Insurance ID # 
Co-payment:
$
Patient’s relationship to subscriber:
 Self
 Spouse
 Child
 Other
Name of secondary insurance (if applicable):
Insurance ID #
IN CASE OF EMERGENCY
Name of friend or relative to call in case of emergency:
Relationship to patient:
Home phone #
Work phone #
The above information is true to the best of my knowledge. I authorize my insurance benefits be paid directly to the physician. I understand that I am financially responsible for any balance. I also authorize [employer] or insurance company to release any information required to process my claims.
Patient/Guardian signature
Date
------------- REPORT [id number] -------------
[facility]
[address]
Isacc Ms. MUNIR, MD. TANVIR:
We wish to report the following on your recent mammography examination. We are sending a report to your referring physician or other health care provider. 
(
Probably benign (not cancer):
Recommend repeat mammogram in 6 months. Please follow-up with your referring physician.
This statement is mandated by the [place], Department of Health.
Your examination was performed by one of our technologists, who are registered radiological technologists and also specially certified in mammography:
___
Mounla, Panteley (M)
Gordillo, Volkswagen (M)
Boron, Farukas (M)
Your mammogram was interpreted by our radiologist.
( 
Majidreza Gutke, M.D.
(Annual Breast Examination by a physician or other health care provider
(Annual Mammography Screening beginning at age 40
(Monthly Breast Self Examination
------------- REPORT [id number] -------------
CHIJIOKE, NASANGMA
EXAM:  2D RIGHT DIAGNOSTIC DIGITAL MAMMOGRAM WITH CAD 
EXAM:  COMPLETE RIGHT BREAST ULTRASOUND
INDICATION: Call back for a new area of architectural distortion within the posterior superior right breast.

[R MLO]
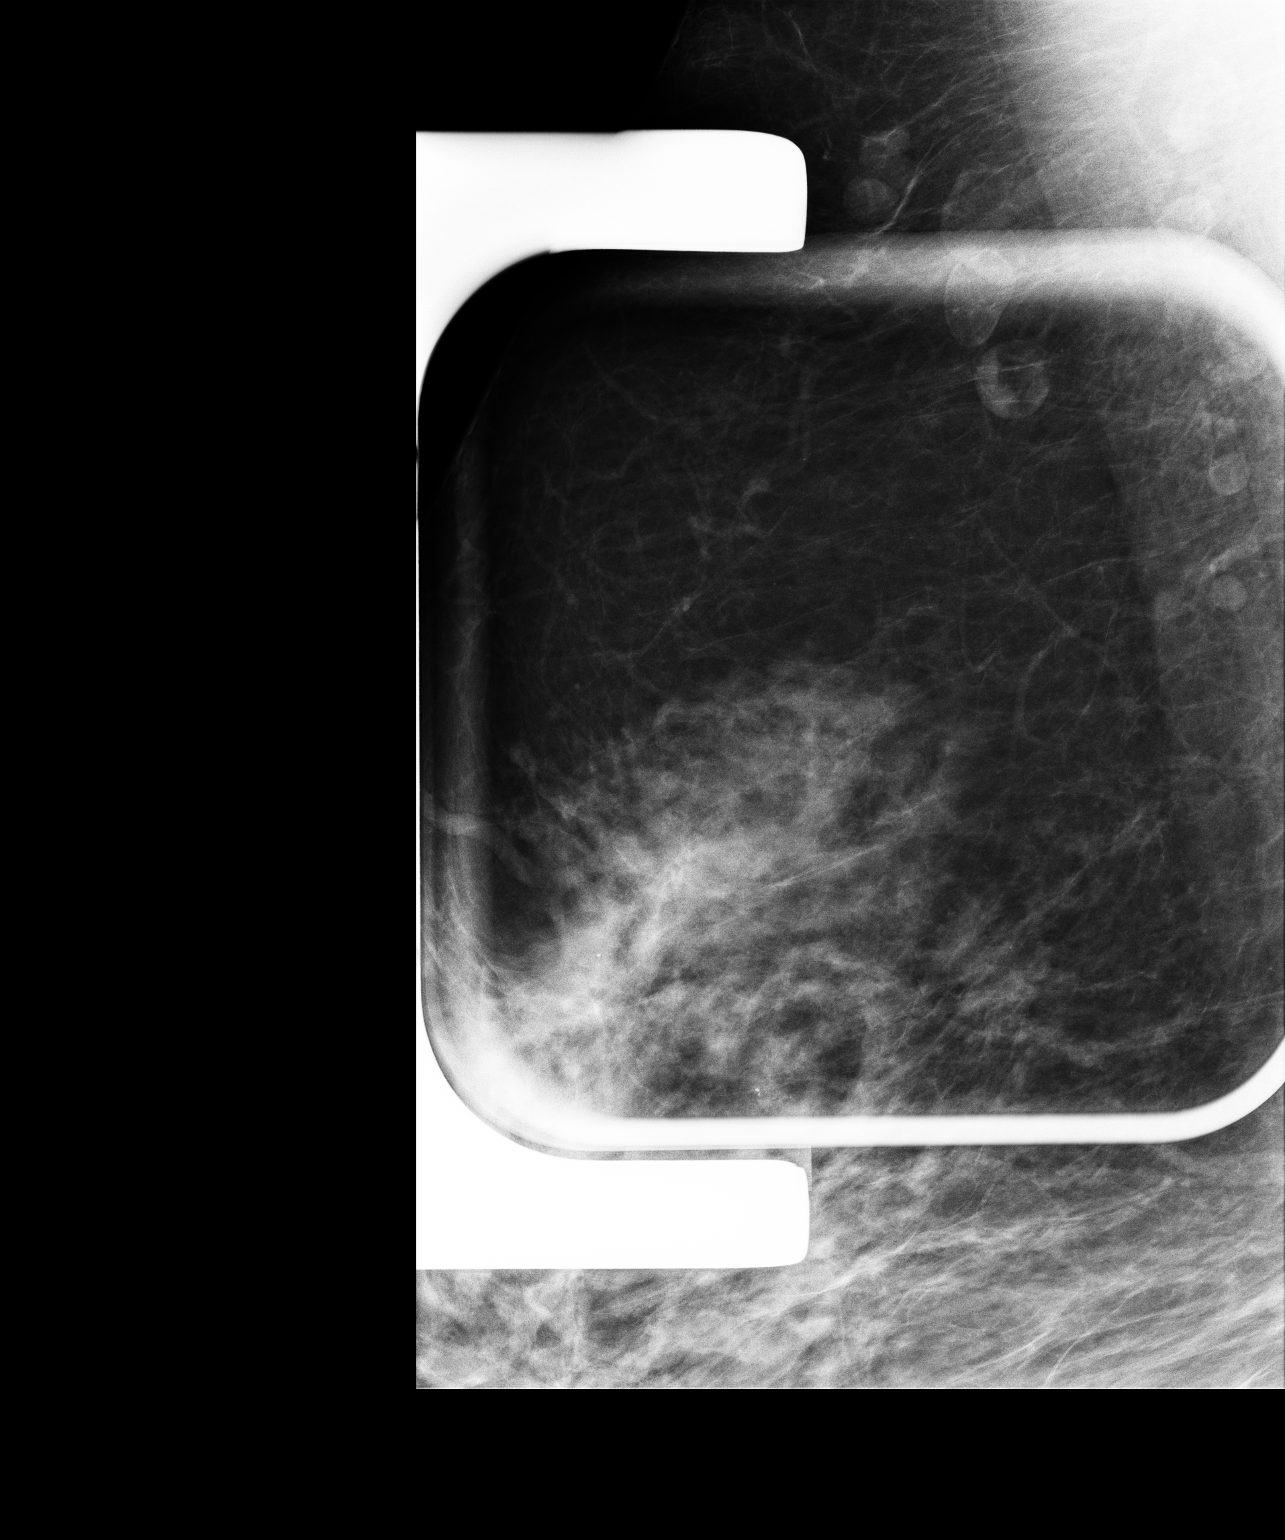

[1 of 1 positions shown; findings below may reference images not displayed]

FINDINGS: Spot compression view of the superior right breast was obtained in MLO projection.  Previously noted area of architectural distortion is less conspicuous.  Breast parenchyma is heterogeneously dense.  

A comprehensive sonographic evaluation of the right breast was also performed and representative images of all four quadrants, retroareolar and axillary regions were obtained.  There is no suspicious solid or cystic mass.  There is no axillary adenopathy.
IMPRESSION: 1.  BIRADS 3-Probably benign findings.  Six-month follow-up right diagnostic mammogram is recommended for reassessment. 

2.  DENSITY CODE –  C (Heterogeneously dense).

Final Assessment Code:

Bi-Rads 3 

BI-RADS 0
Need additional imaging evaluation

BI-RADS 1
Negative mammogram

BI-RADS 2
Benign finding

BI-RADS 3
Probably benign finding: short-interval follow-up suggested

BI-RADS 4
Suspicious abnormality:  biopsy should be considered

BI-RADS 5
Highly suggestive of malignancy; appropriate action should be taken

BI-RADS 6
Known Biopsy-proven Malignancy – Appropriate action should be taken

NOTE:
In compliance with Federal regulations, the results of this mammogram are being sent to the patient.

## 2018-04-23 IMAGING — MG 3D DX MAMMO UNI W/CAD
3 series · 4 of 13 positions shown · non-contrast
Comparison: 09/29/2018
and 10/06/2018.

------------- REPORT [id number] -------------
TART, WILNER

EXAM:  UNILATERAL RIGHT 3D DIAGNOSTIC DIGITAL MAMMOGRAM WITH TOMOSYNTHESIS AND CAD
INDICATION: Followup.

[Series 9984: R CC · right · 0.10mm/px · 2 of 2 slices shown]
[im 1/2]
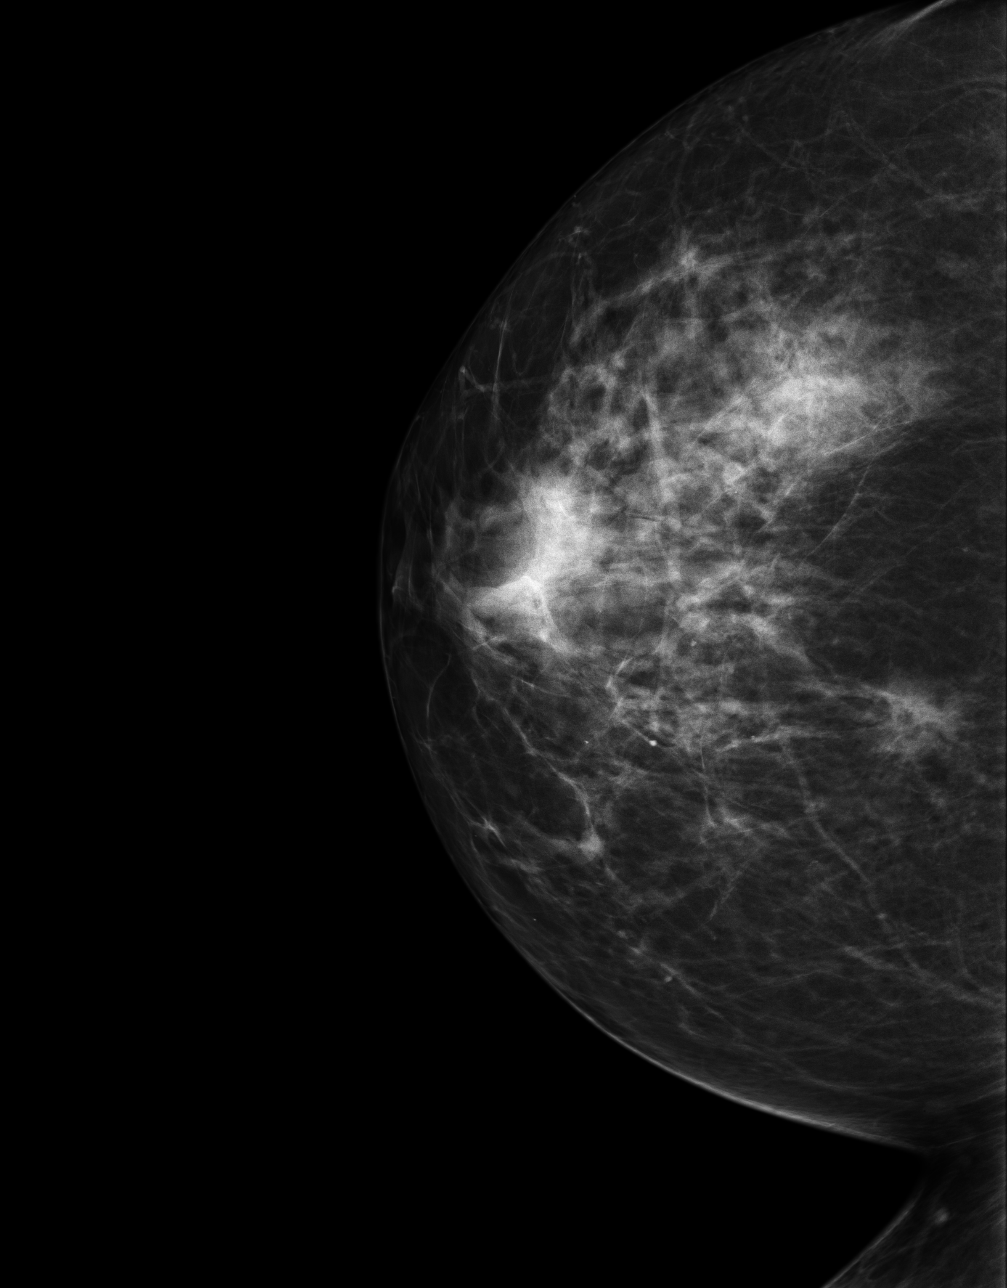
[im 2/2]
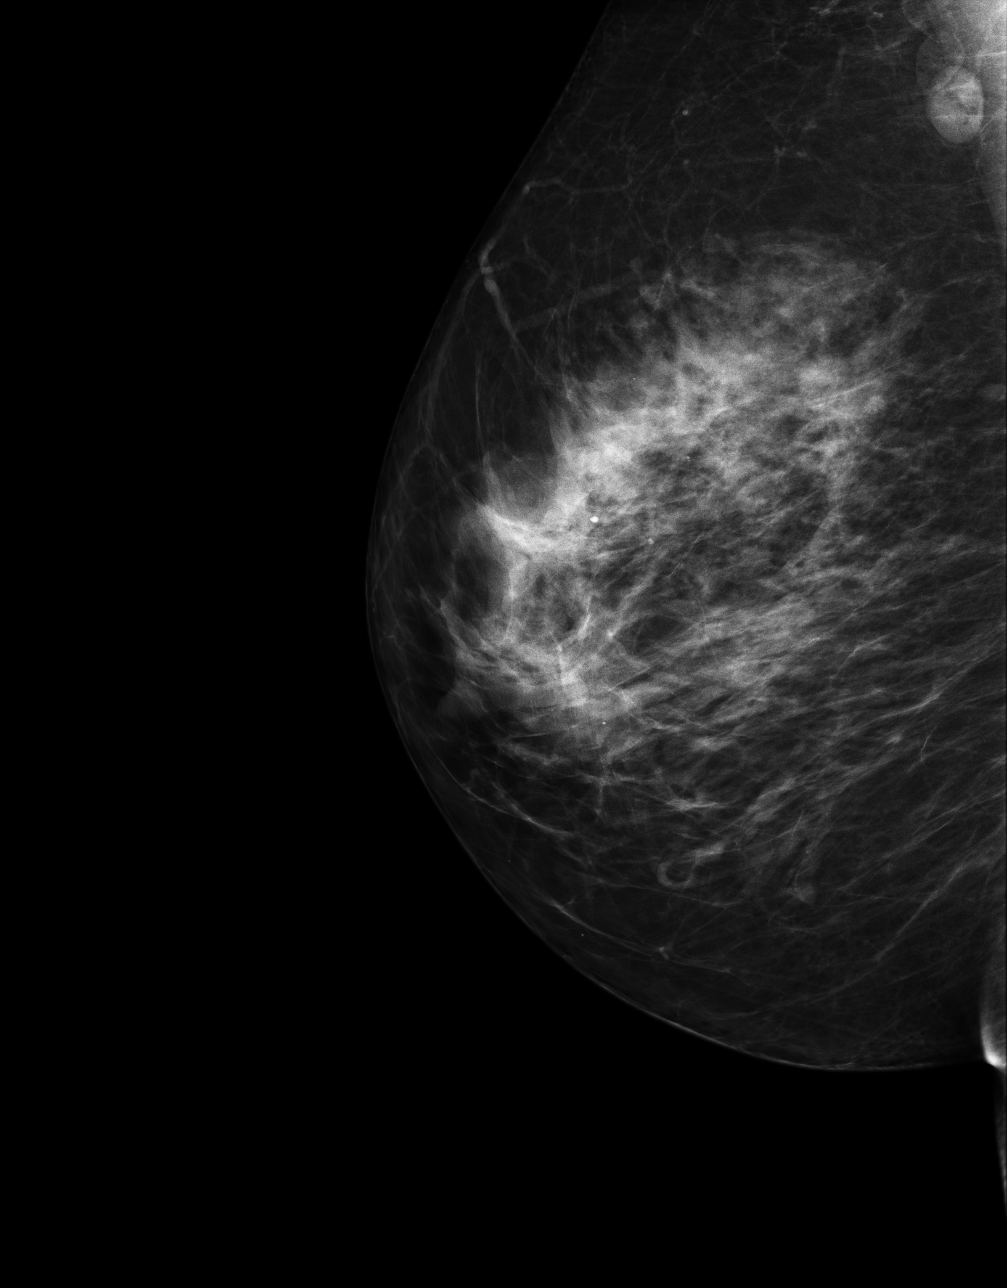

[R]
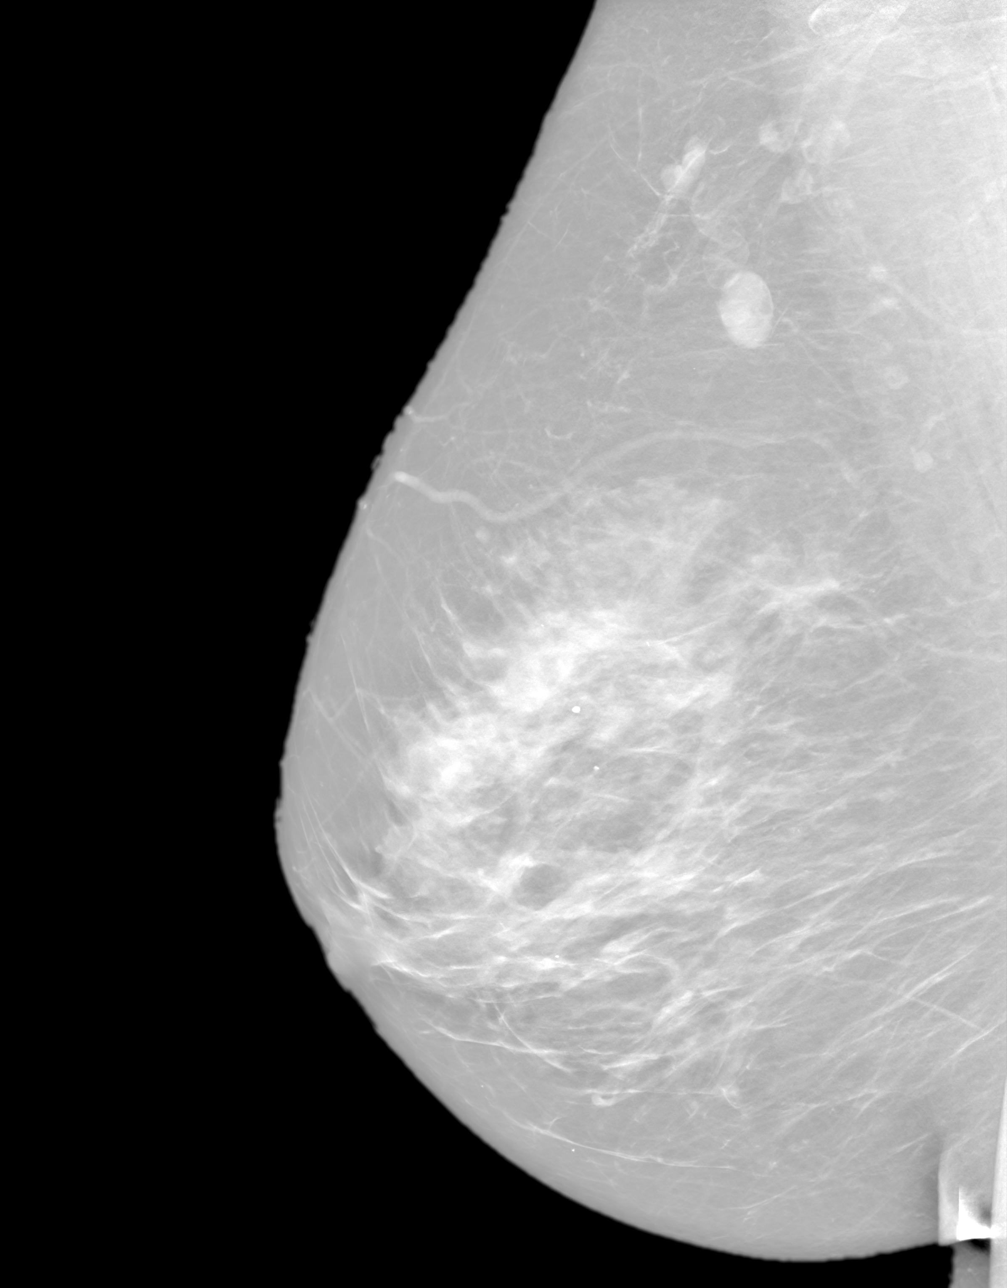

[3D DX MAMMO UNI W/CAD · tomo slice 16/98.0]
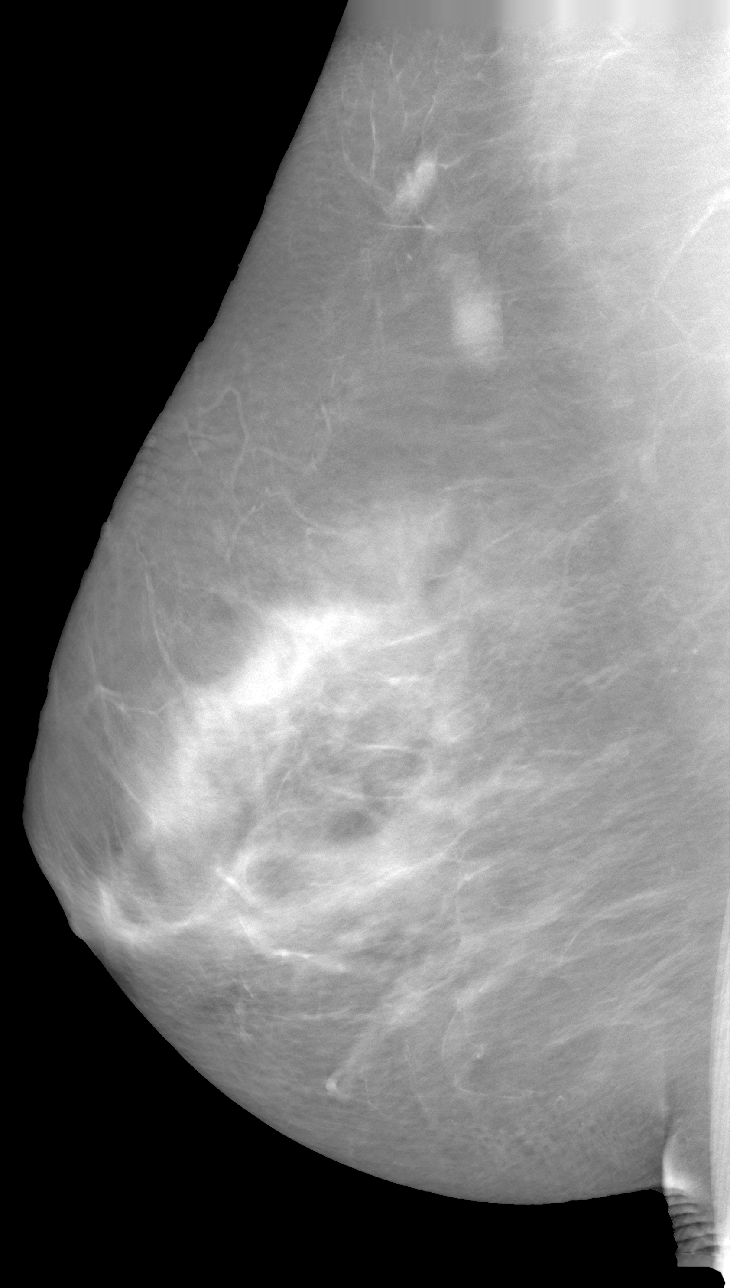

[4 of 13 positions shown; findings below may reference images not displayed]

FINDINGS: A unilateral right mammogram is performed.  The nodular density seen on the compression spot radiograph and the previous study dated 09/29/2018 is present and appears stable.  No development of malignant calcifications or spiculation is seen.  There is dense glandular tissue within the right breast.  3D tomosynthesis shows no change in the nodule.
IMPRESSION: 1.  Stable-appearing nodule in the upper right breast when compared with the earlier studies of 09/29/2018 and 10/06/2018. 

2.  ACR BREAST DENSITY:  C (Heterogeneously dense).

3.  BIRADS 2-Benign findings. Patient has been added in a reminder system with a target date for the next screening mammography.

4.  Management recommendation:  Routine mammographic screening.  

Final Assessment Code:

Bi-Rads 2 

BI-RADS 0
Need additional imaging evaluation

BI-RADS 1
Negative mammogram

BI-RADS 2
Benign finding

BI-RADS 3
Probably benign finding: short-interval follow-up suggested

BI-RADS 4
Suspicious abnormality:  biopsy should be considered

BI-RADS 5
Highly suggestive of malignancy; appropriate action should be taken

BI-RADS 6
Known Biopsy-proven Malignancy – Appropriate action should be taken

NOTE:
In compliance with Federal regulations, the results of this mammogram are being sent to the patient.

------------- REPORT [id number] -------------
[facility]
[address]
Daina Ms.ANOLD, PHILANI THOBO:
We wish to report the following on your recent mammography examination. We are sending a report to your referring physician or other health care provider. 
(       Normal/Negative:
No evidence of cancer.
This statement is mandated by the [place], Department of Health.
Your examination was performed by one of our technologists, who are registered radiological technologists and also specially certified in mammography:
___
Parlak, Edaly (M)
___
Dang, Mcalex (M)

Your mammogram was interpreted by our radiologist.

( 
Sofeine Made, M.D.

(Annual Breast Examination by a physician or other health care provider
(Annual Mammography Screening beginning at age 40
(Monthly Breast Self Examination

## 2018-10-15 IMAGING — MG 3D SCREENING MAMMO BIL W/CAD
6 series · 7 of 24 positions shown · non-contrast
Comparison: 10/15/2019 and 04/12/2019.

------------- REPORT [id number] -------------
[facility]
[address]
Tiger Ms.TIDONA, BOCCIA:
We wish to report the following on your recent mammography examination. We are sending a report to your referring physician or other health care provider. 
(       Abnormal: There is a finding on your mammogram that requires further tests for a more thorough evaluation. You should contact your referring physician as soon as possible (if you have not already done so).
This statement is mandated by the [place], Department of Health.
Your examination was performed by one of our technologists, who are registered radiological technologists and also specially certified in mammography:
___
Ekram, Deare (M)
Ballou, Nilam (M)

Your mammogram was interpreted by our radiologist.
( 
Maria Elena Tomas, M.D.
(Annual Breast Examination by a physician or other health care provider
(Annual Mammography Screening beginning at age 40
(Monthly Breast Self Examination
------------- REPORT [id number] -------------
BUESACO, HAROLD EDUARDO
EXAM:  3D BILATERAL ANNUAL SCREENING DIGITAL MAMMOGRAM WITH TOMOSYNTHESIS AND CAD
INDICATION: Screening.

[R CC]
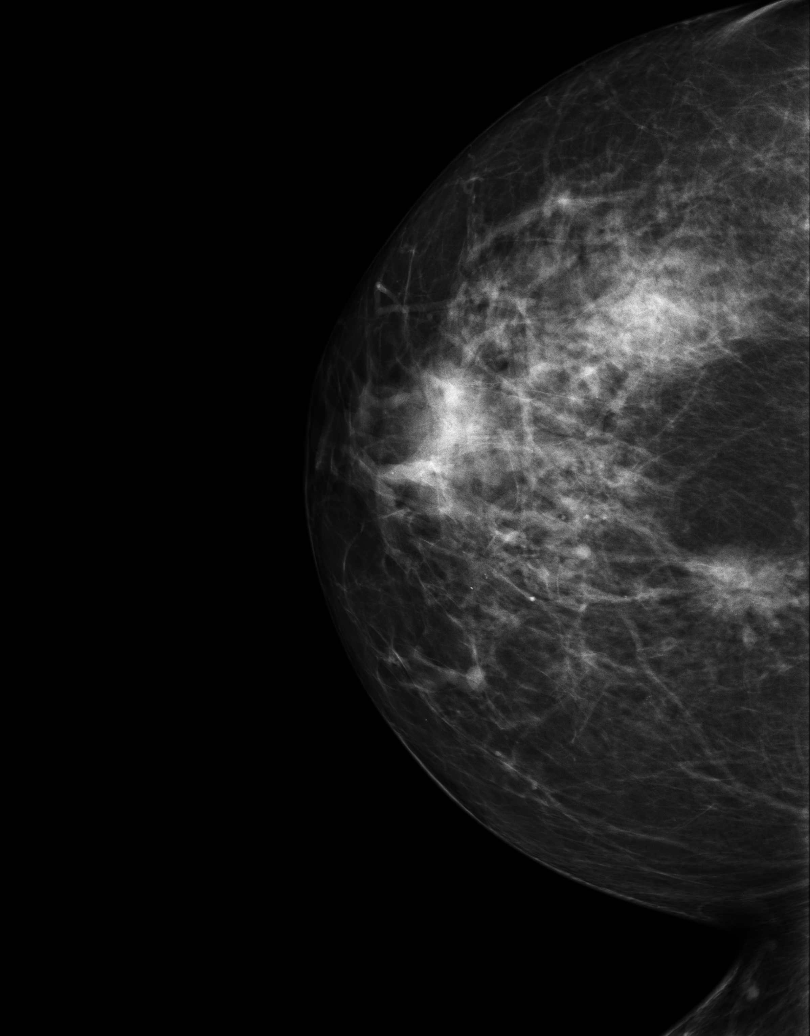

[3D SCREENING MAMMO BIL W/CAD · 2 acquisitions, 2 frames shown (1 of 2)]
[im 1/2]
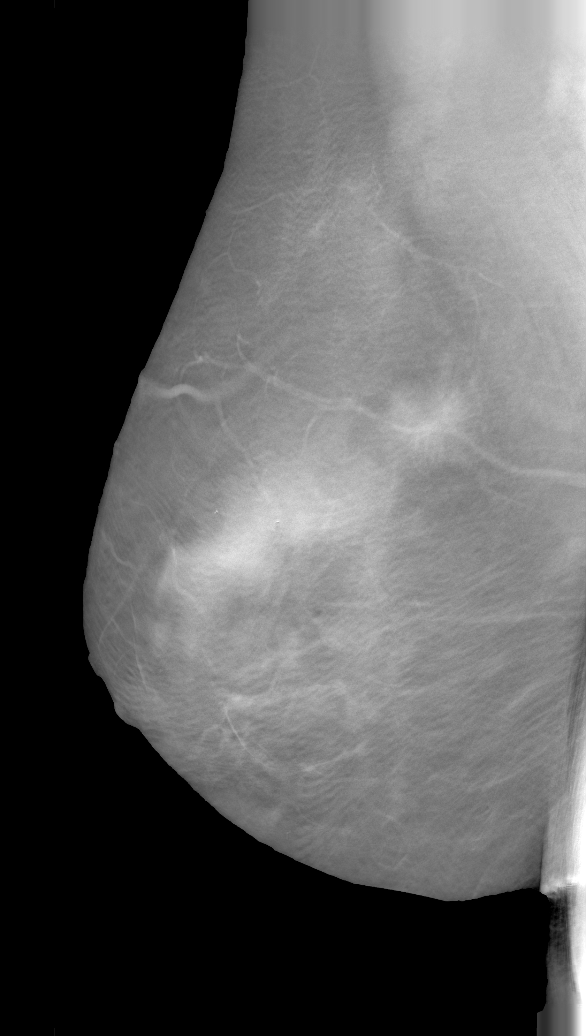
[im 2/2]
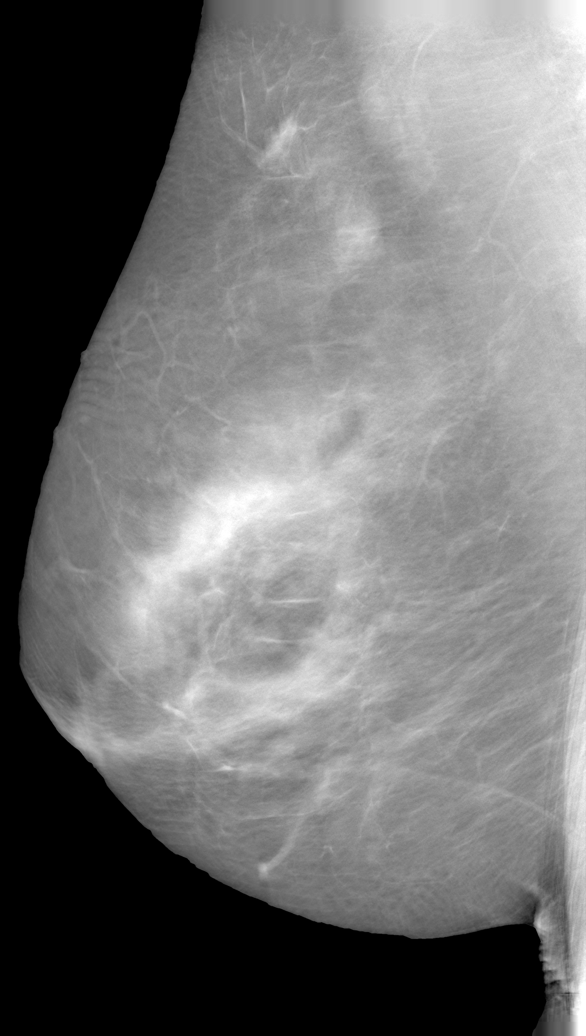

[3D SCREENING MAMMO BIL W/CAD (2 of 2) · tomo slice 16/103.0]
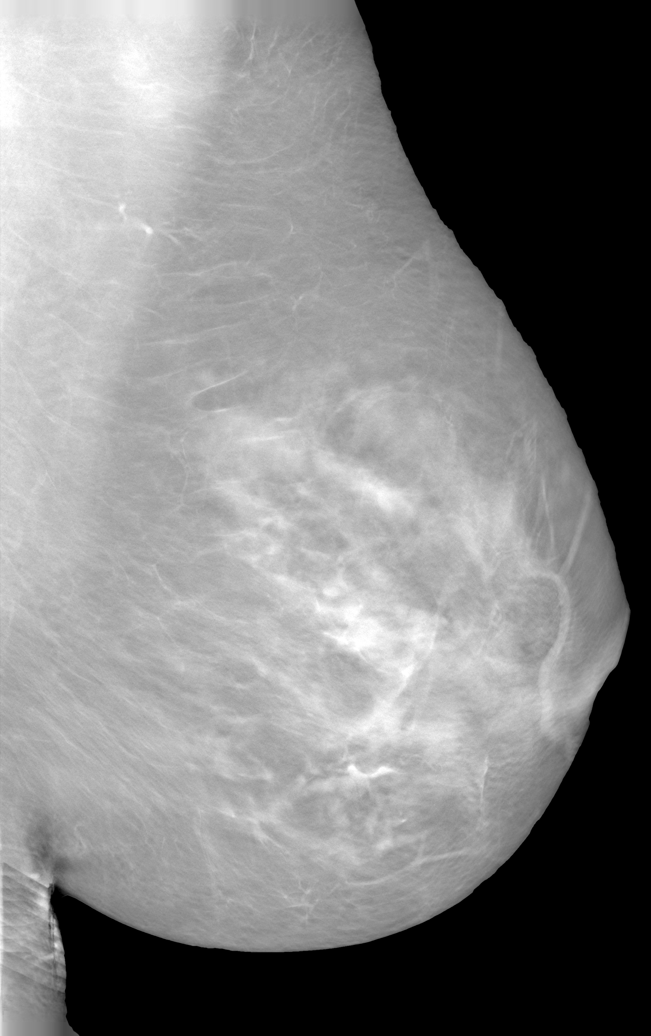

[R]
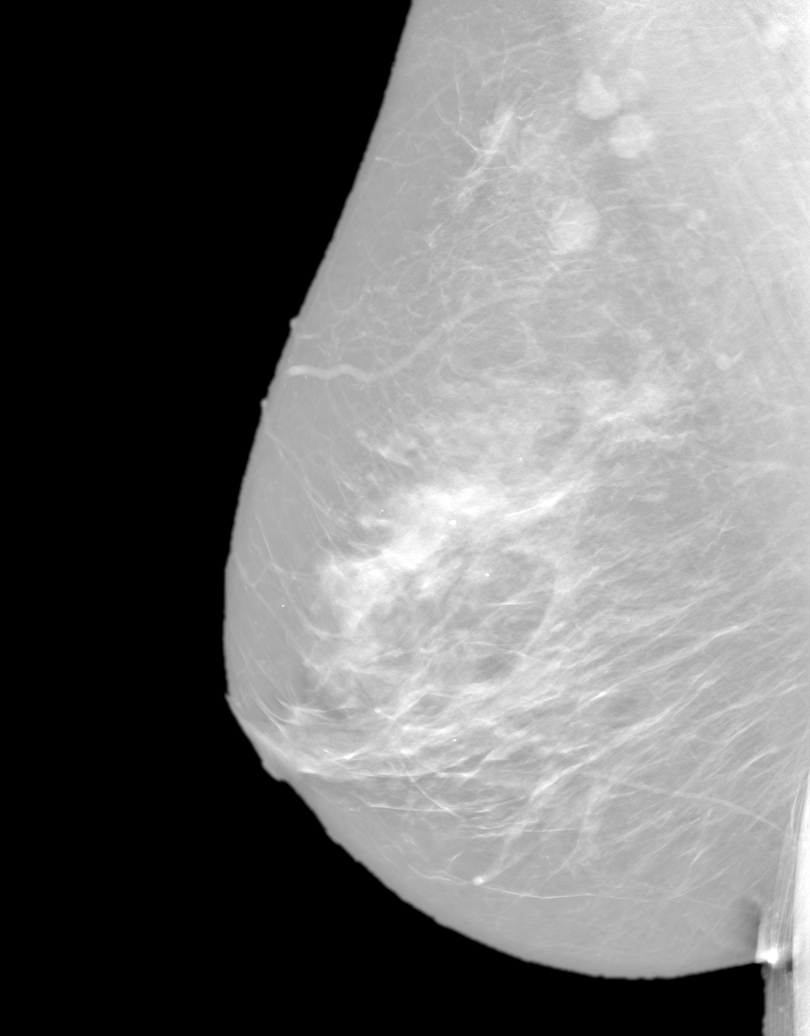

[L]
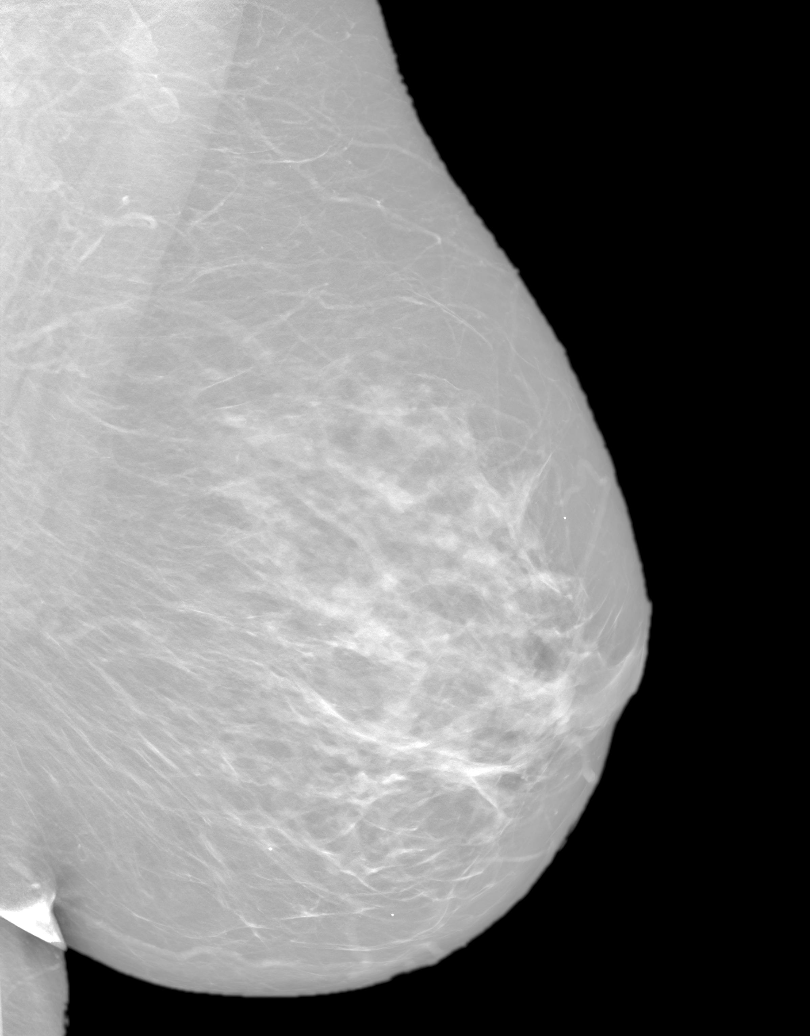

[R MLO]
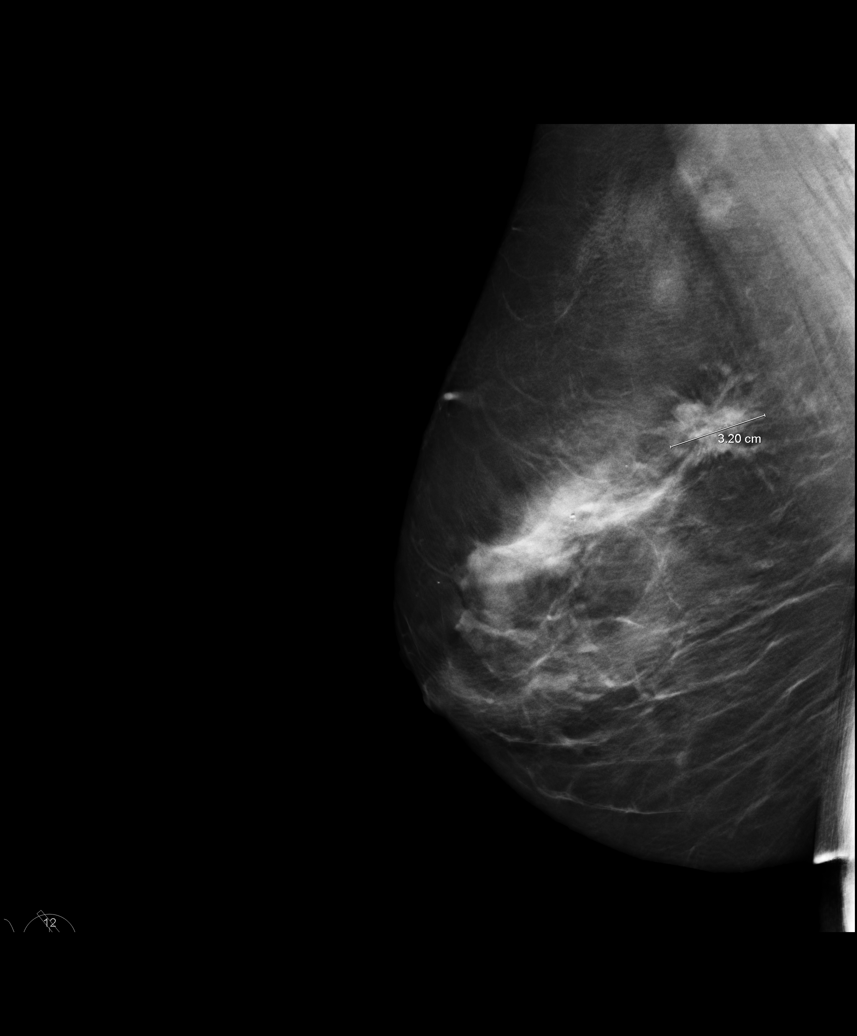

[7 of 24 positions shown; findings below may reference images not displayed]

FINDINGS: Breast parenchyma is heterogeneously dense.  There is a 3 cm irregular mass within the posterior superior right breast with probable microcalcifications.  There is no skin thickening or nipple retraction.
IMPRESSION: 1.  BIRADS 0-Need additional imaging evaluation.  A 3 cm irregular mass within the superior right breast with probable microcalcifications.  Patient will be called back for further evaluation with spot compression views and ultrasound.  

2.  DENSITY CODE –  C (Heterogeneously dense).

Final Assessment Code:

Bi-Rads 0

BI-RADS 0
Need additional imaging evaluation

BI-RADS 1
Negative mammogram

BI-RADS 2
Benign finding

BI-RADS 3
Probably benign finding; short-interval follow-up suggested

BI-RADS 4
Suspicious abnormality; biopsy should be considered

BI-RADS 5
Highly suggestive of malignancy; appropriate action should be taken

BI-RADS 6
Known biopsy-proven malignancy; appropriate action should be taken

NOTE:
In compliance with Federal regulations, the results of this mammogram are being sent to the patient.

## 2018-10-23 IMAGING — US ULTRASOUND BREAST
2 series · 13 of 25 positions shown · non-contrast
Comparison: Mammogram dated 10/15/2018, 04/23/2018 and 10/12/2017.

FUENTES GARAY, EDWIN RONDON

EXAM:  RIGHT 2D DIAGNOSTIC DIGITAL MAMMOGRAM WITH CAD 
EXAM:  COMPLETE RIGHT BREAST ULTRASOUND
INDICATION: Call back for a 3 cm irregular mass within the posterior superior right breast.

[Series 1: ultrasound breast · 0.08mm/px · 12 of 60 slices shown (1 of 2)]
[im 1/60]
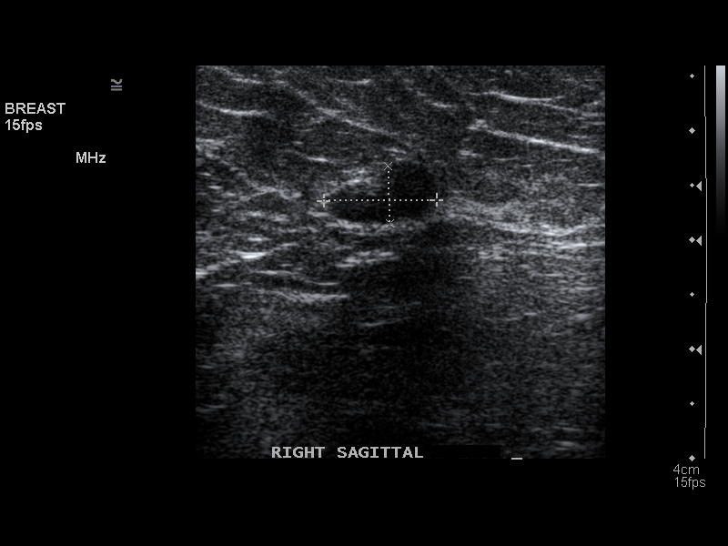
[im 6/60]
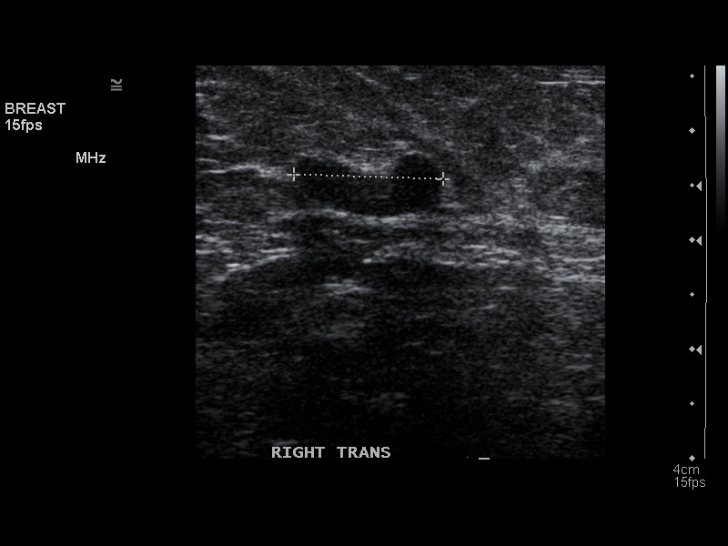
[im 11/60]
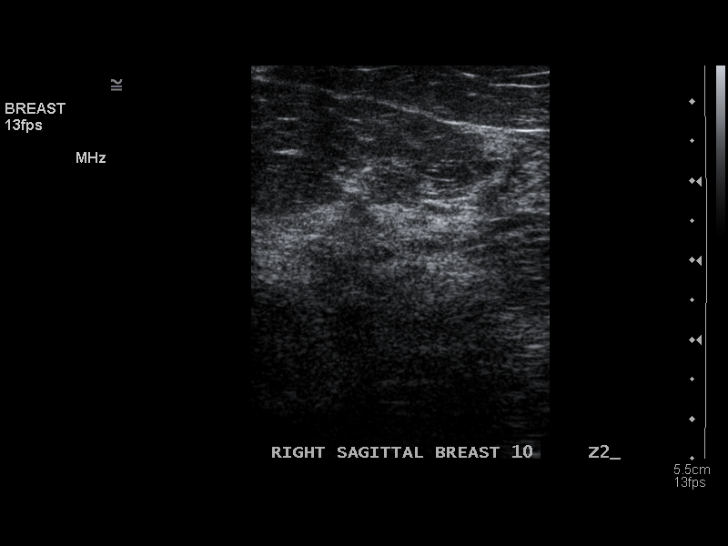
[im 17/60]
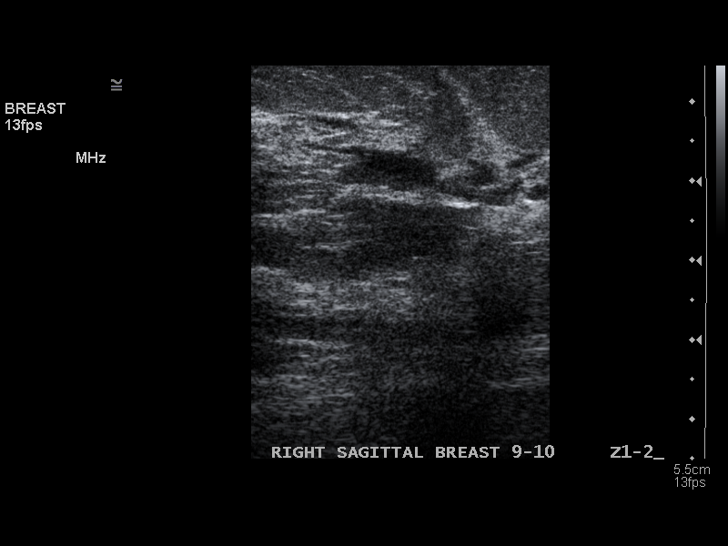
[im 22/60]
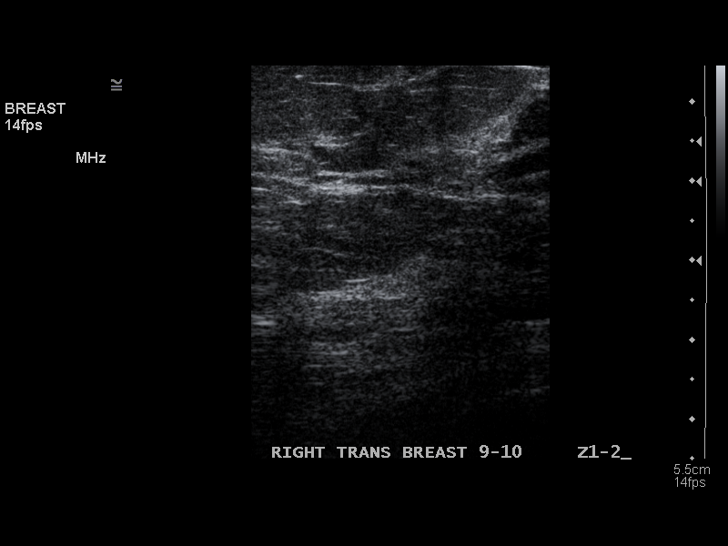
[im 27/60]
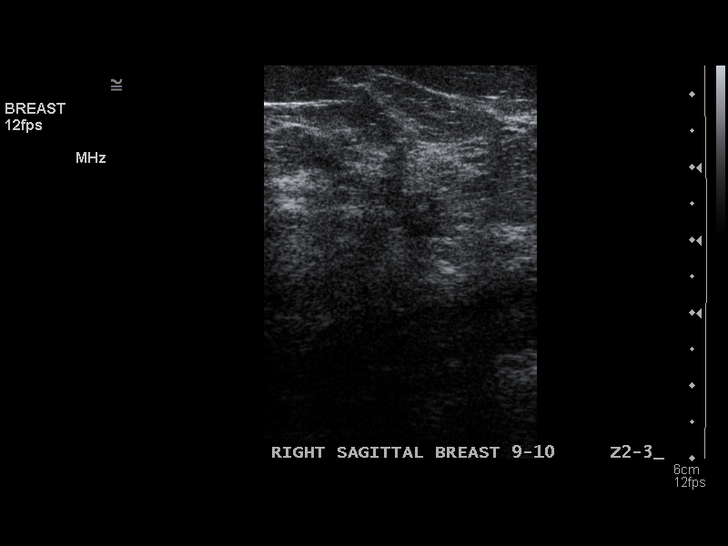
[im 33/60]
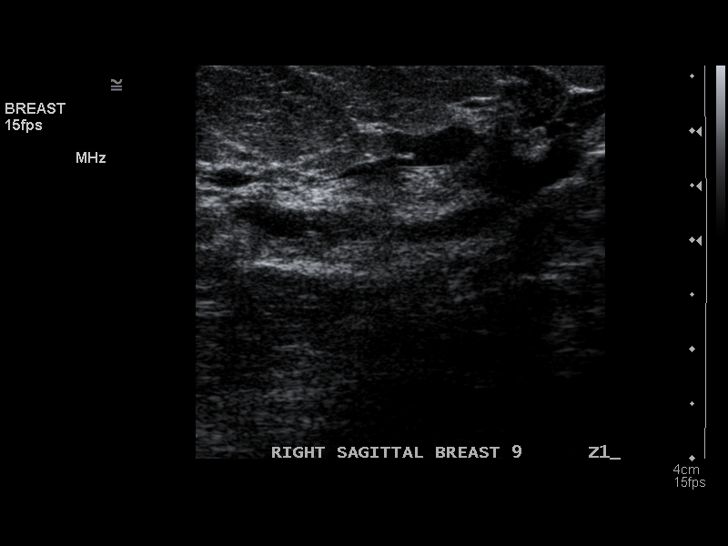
[im 38/60]
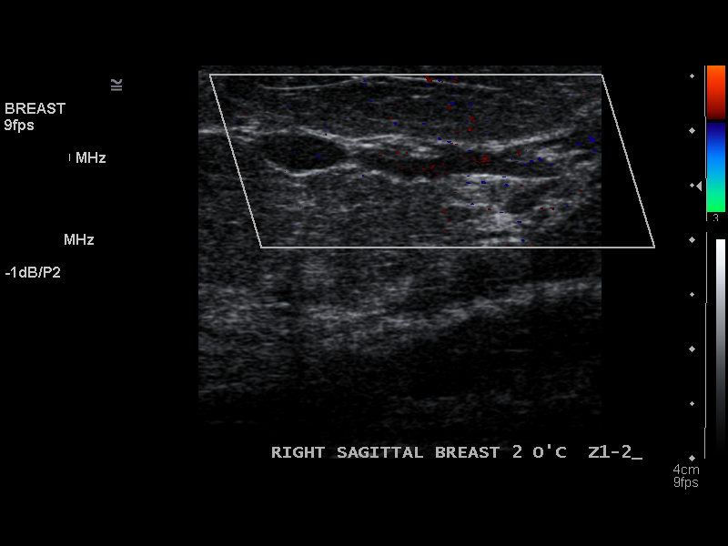
[im 43/60]
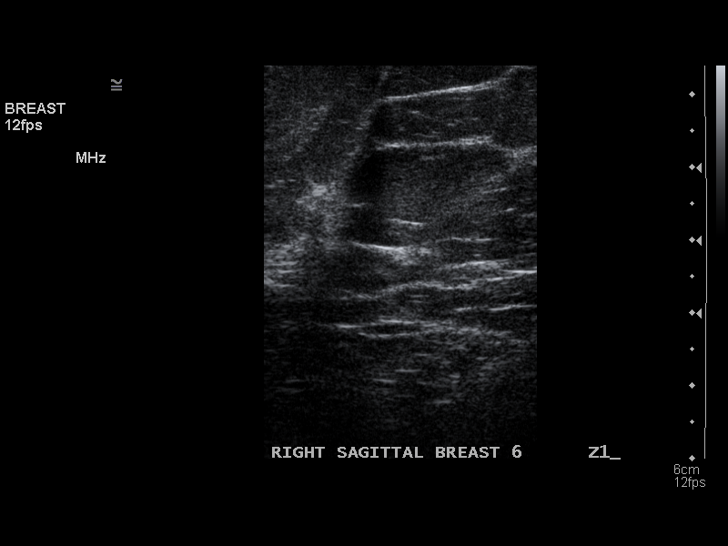
[im 49/60]
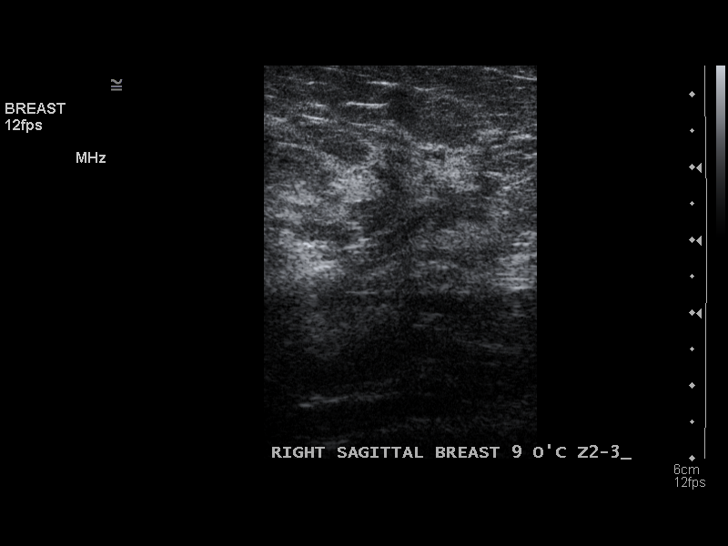
[im 54/60]
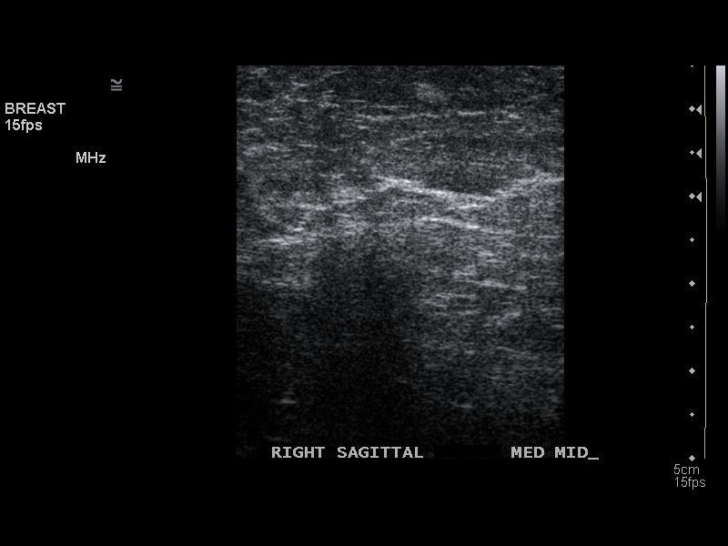
[im 60/60]
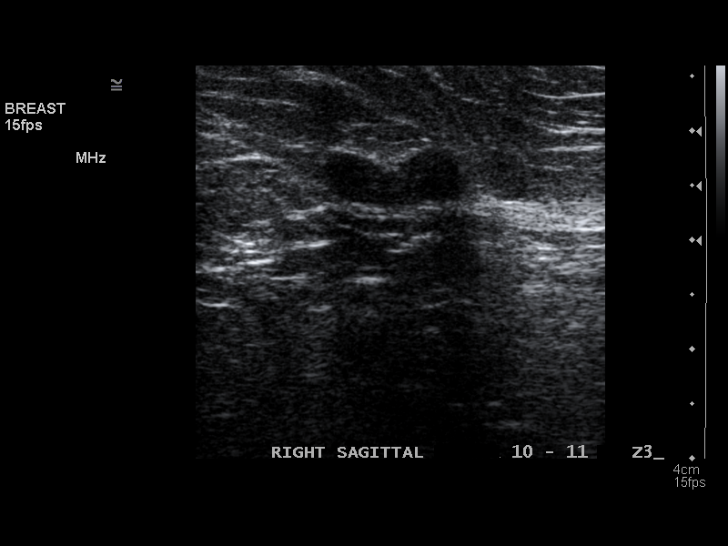

[Series 3: ultrasound breast · 0.11mm/px · 1 of 6 slices shown (2 of 2)]
[im 6/6]
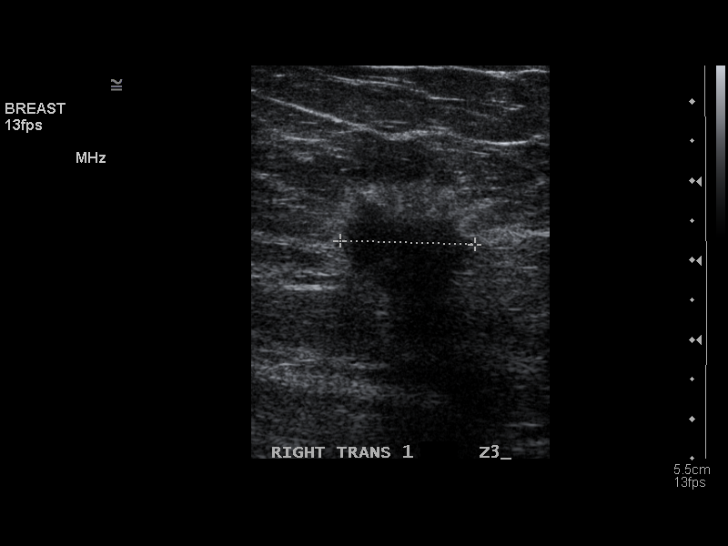

[13 of 25 positions shown; findings below may reference images not displayed]

FINDINGS: Spot compression views of the right breast mass were obtained in CC and MLO projections.  The irregular mass with microcalcifications persists on both views.  Breast parenchyma is heterogeneously dense.  

A comprehensive sonographic evaluation of the right breast was also performed and representative images of all four quadrants, retroareolar and axillary regions were obtained.  There is a 3 cm ill-defined shadowing mass at 1 o'clock position.  This likely corresponds to the mammographic finding.  There is no axillary adenopathy.
IMPRESSION: 1.  BIRADS [place]-Xntermediate probability of malignancy.  A 3 cm right breast mass with microcalcifications as detailed above.  This is highly concerning for a malignancy and followup ultrasound-guided core biopsy is recommended for definitive diagnosis. 

2.  DENSITY CODE –  C (Heterogeneously dense).

Final Assessment Code:

Bi-Rads 4B

BI-RADS 0
Need additional imaging evaluation

BI-RADS 1
Negative mammogram

BI-RADS 2
Benign finding

BI-RADS 3
Probably benign finding; short-interval follow-up suggested

BI-RADS 4
Suspicious abnormality; biopsy should be considered

BI-RADS 5
Highly suggestive of malignancy; appropriate action should be taken

BI-RADS 6
Known biopsy-proven malignancy; appropriate action should be taken

NOTE:
In compliance with Federal regulations, the results of this mammogram are being sent to the patient.

## 2018-10-23 IMAGING — MG 2D DX MAMMO UNI W/CAD
1 series · 2 of 2 positions shown · non-contrast
Comparison: Mammogram dated 03/12/2020, 09/19/2019 and 03/10/2019.

------------- REPORT [id number] -------------
[facility]
[address]
[place], [place]:
We wish to report the following on your recent mammography examination. We are sending a report to your referring physician or other health care provider. 
(       Abnormal: There is a finding on your mammogram that requires further tests for a more thorough evaluation. You should contact your referring physician as soon as possible (if you have not already done so).
This statement is mandated by the [place], Department of Health.
Your examination was performed by one of our technologists, who are registered radiological technologists and also specially certified in mammography:
___
Ekram, Deare (M)
Ballou, Nilam (M)

Your mammogram was interpreted by our radiologist.
( 
Maria Elena Tomas, M.D.
(Annual Breast Examination by a physician or other health care provider
(Annual Mammography Screening beginning at age 40
(Monthly Breast Self Examination
------------- REPORT [id number] -------------
DOSTER, MOYSES
EXAM:  RIGHT 2D DIAGNOSTIC DIGITAL MAMMOGRAM WITH CAD 
EXAM:  COMPLETE RIGHT BREAST ULTRASOUND
INDICATION: Call back for a 3 cm irregular mass within the posterior superior right breast.

[R CC · right · 0.06mm/px · 2 of 2 slices shown]
[im 1/2]
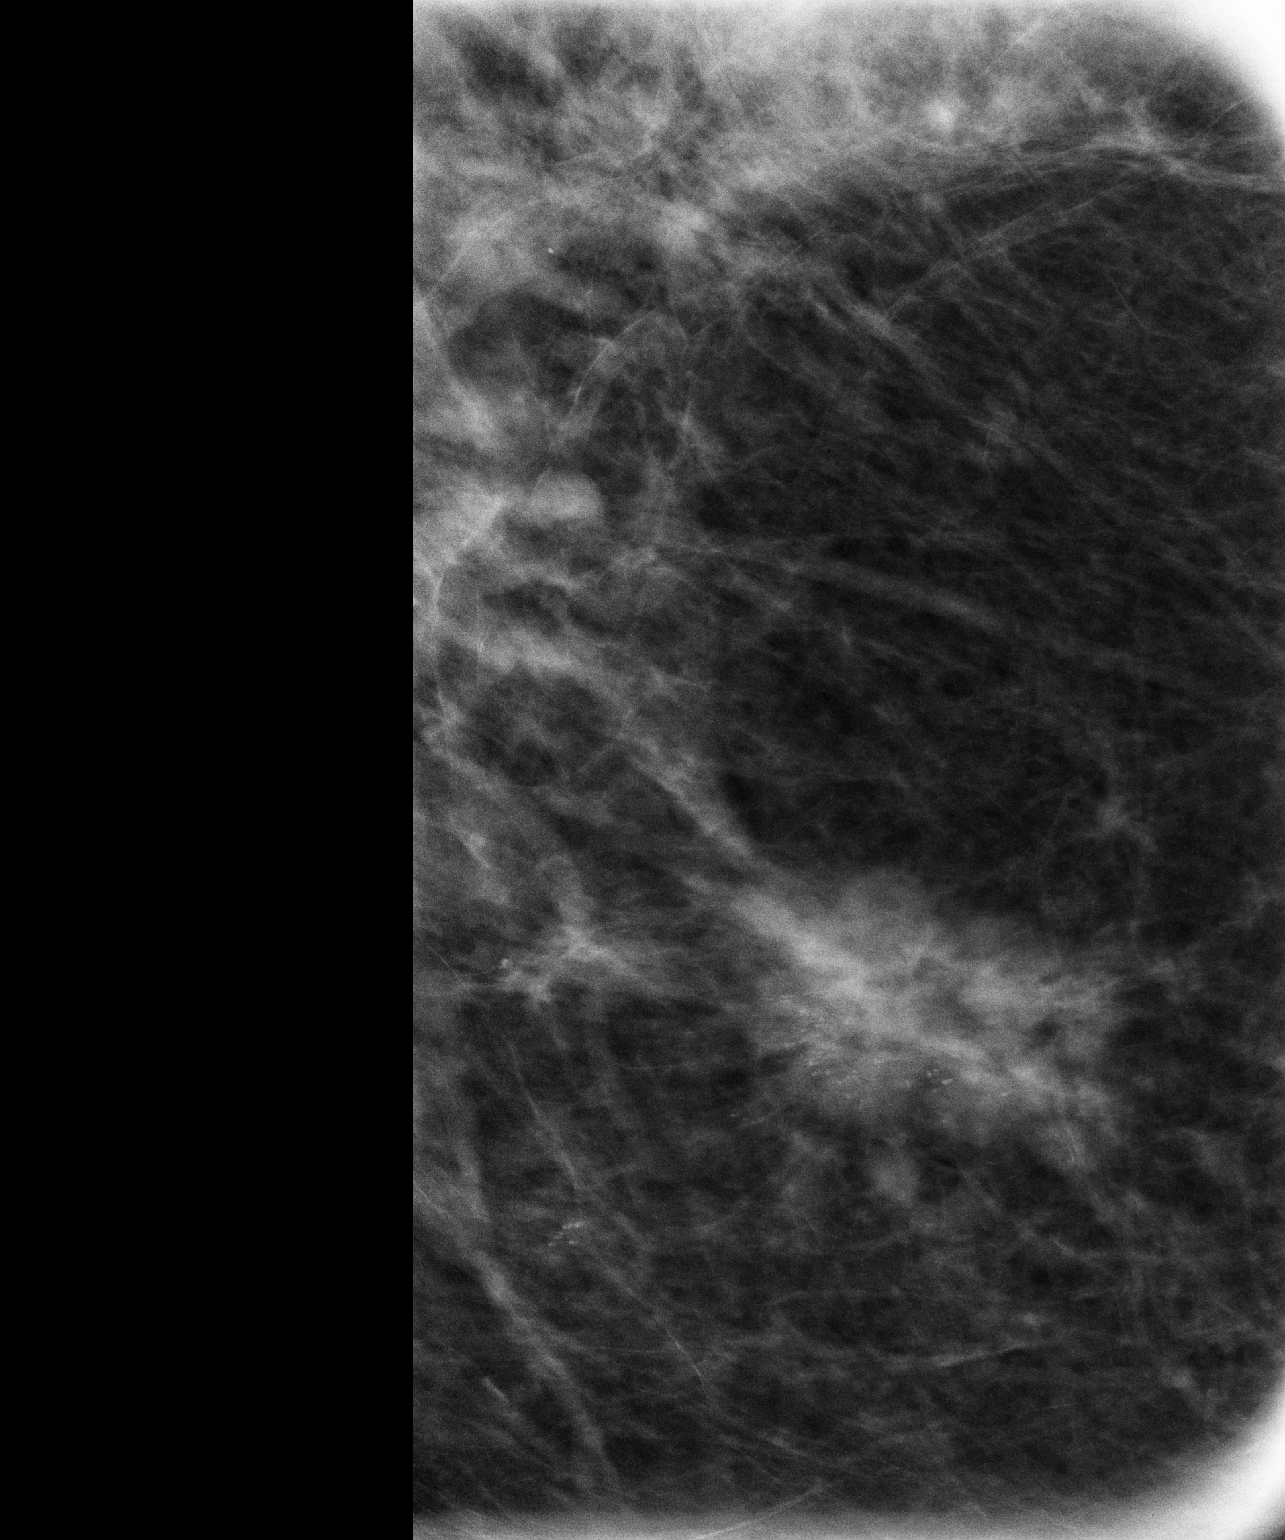
[im 2/2]
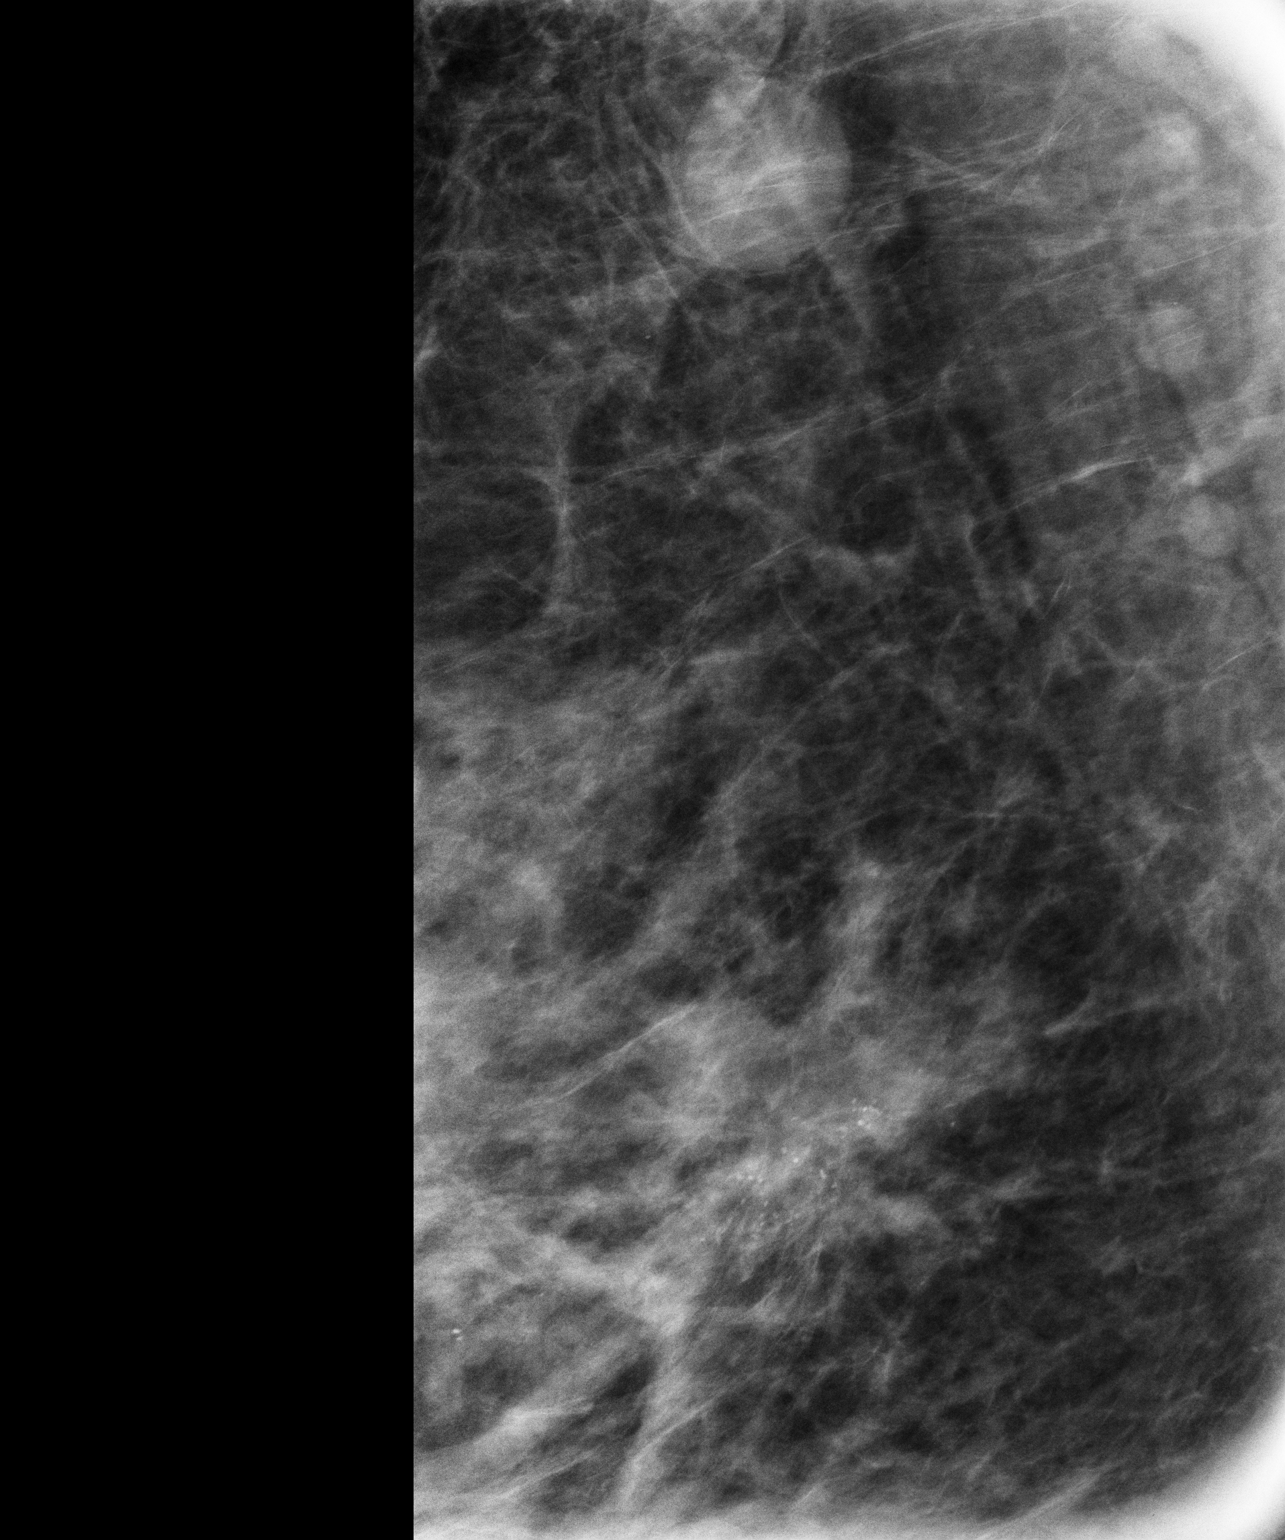

[2 of 2 positions shown; findings below may reference images not displayed]

FINDINGS: Spot compression views of the right breast mass were obtained in CC and MLO projections.  The irregular mass with microcalcifications persists on both views.  Breast parenchyma is heterogeneously dense.  

A comprehensive sonographic evaluation of the right breast was also performed and representative images of all four quadrants, retroareolar and axillary regions were obtained.  There is a 3 cm ill-defined shadowing mass at 1 o'clock position.  This likely corresponds to the mammographic finding.  There is no axillary adenopathy.
IMPRESSION: 1.  BIRADS BQ-8ntermediate probability of malignancy.  A 3 cm right breast mass with microcalcifications as detailed above.  This is highly concerning for a malignancy and followup ultrasound-guided core biopsy is recommended for definitive diagnosis. 

2.  DENSITY CODE –  C (Heterogeneously dense).

Final Assessment Code:

Bi-Rads 4B

BI-RADS 0
Need additional imaging evaluation

BI-RADS 1
Negative mammogram

BI-RADS 2
Benign finding

BI-RADS 3
Probably benign finding; short-interval follow-up suggested

BI-RADS 4
Suspicious abnormality; biopsy should be considered

BI-RADS 5
Highly suggestive of malignancy; appropriate action should be taken

BI-RADS 6
Known biopsy-proven malignancy; appropriate action should be taken

NOTE:
In compliance with Federal regulations, the results of this mammogram are being sent to the patient.

## 2020-03-23 IMAGING — MR MRI LUMBAR SPINE WITHOUT CONTRAST
4 of 6 series · 30 of 48 positions shown · IV contrast (gadolinium)
Comparison: None available.

﻿EXAM:  MRI LUMBAR SPINE WITHOUT CONTRAST
INDICATION: Lower back pain with left lower extremity radiculopathy.
TECHNIQUE: Multiplanar multisequential MRI of the lumbar spine was performed without gadolinium contrast.

[Series 6: T1 · sagittal · 4.0mm · 0.94mm/px · 4 of 13 slices shown]
[im 1/13]
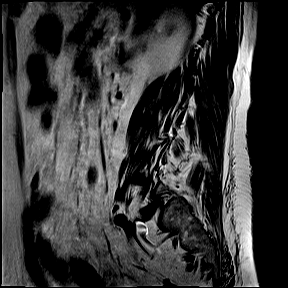
[im 4/13]
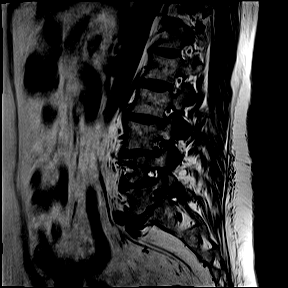
[im 7/13]
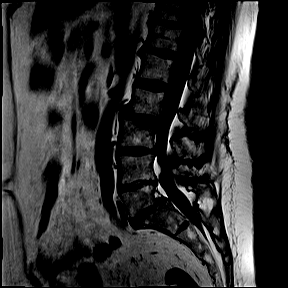
[im 13/13]
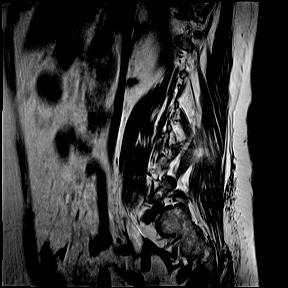

[Series 7: T2 · sagittal · 4.0mm · 0.94mm/px · 6 of 13 slices shown (1 of 3)]
[im 1/13]
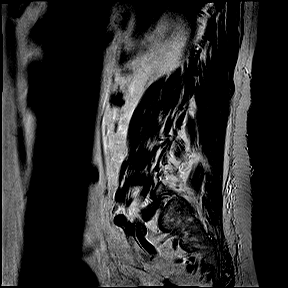
[im 3/13]
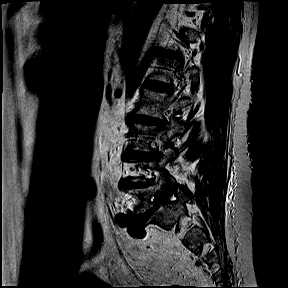
[im 5/13]
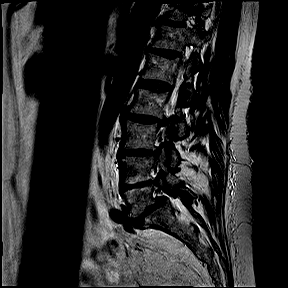
[im 8/13]
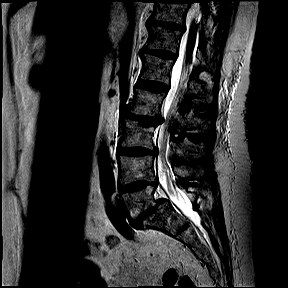
[im 10/13]
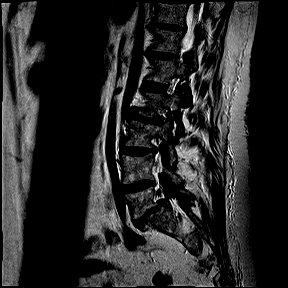
[im 13/13]
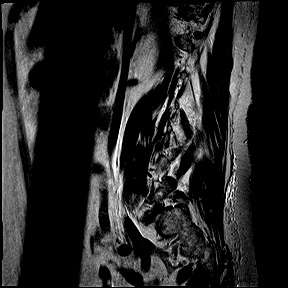

[Series 9: T2 · axial · 4.0mm · 0.47mm/px · z∈[-97,+115]mm · 11 of 26 slices shown (2 of 3)]
[im 1/26]
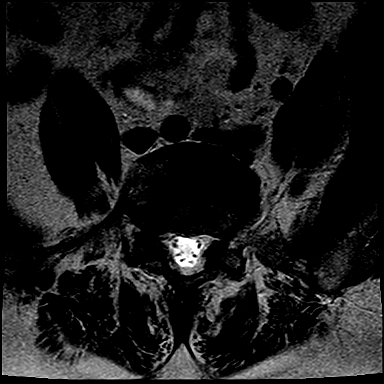
[im 3/26]
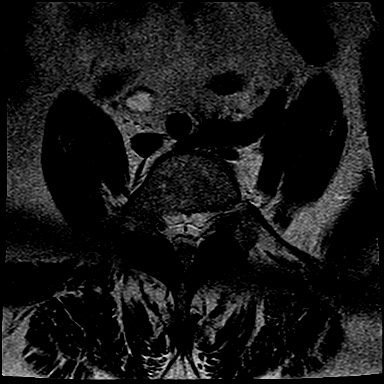
[im 6/26]
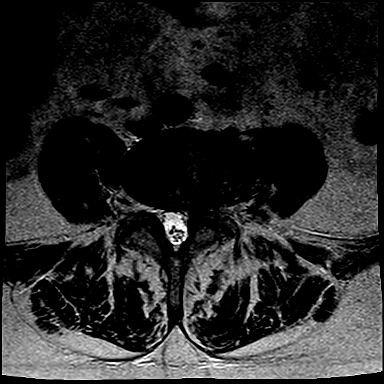
[im 8/26]
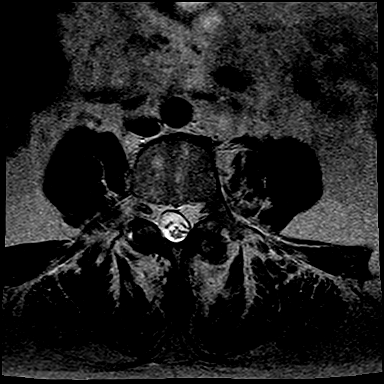
[im 11/26]
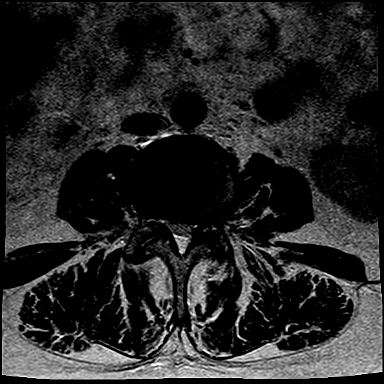
[im 13/26]
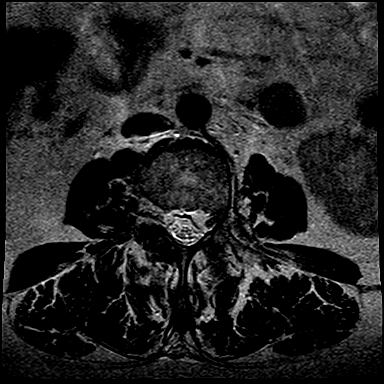
[im 16/26]
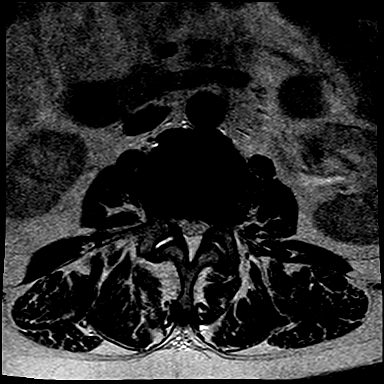
[im 18/26]
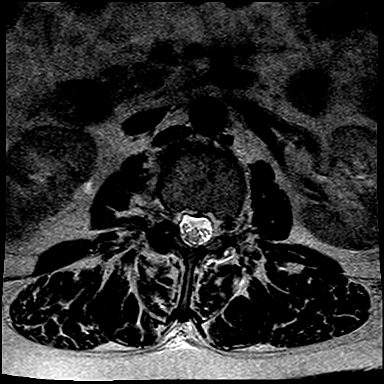
[im 21/26]
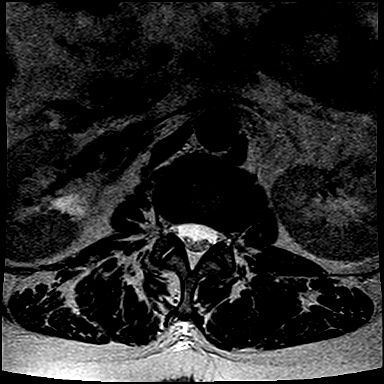
[im 23/26]
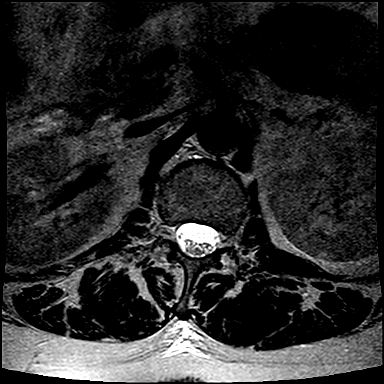
[im 26/26]
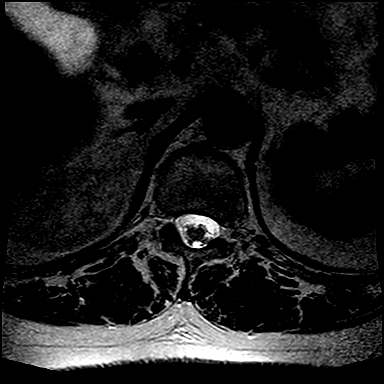

[Series 11: T2 · coronal · 5.0mm · 0.82mm/px · 9 of 22 slices shown (3 of 3)]
[im 1/22]
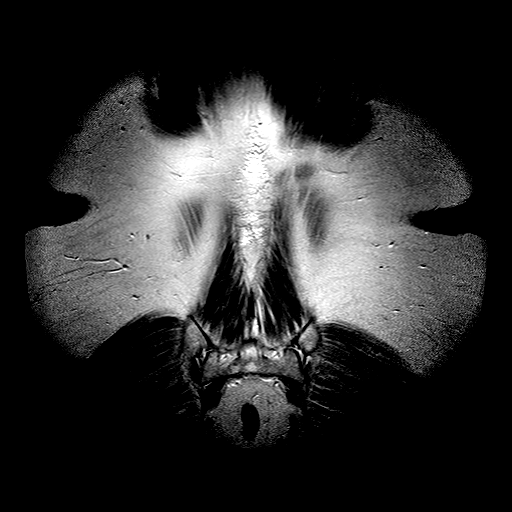
[im 3/22]
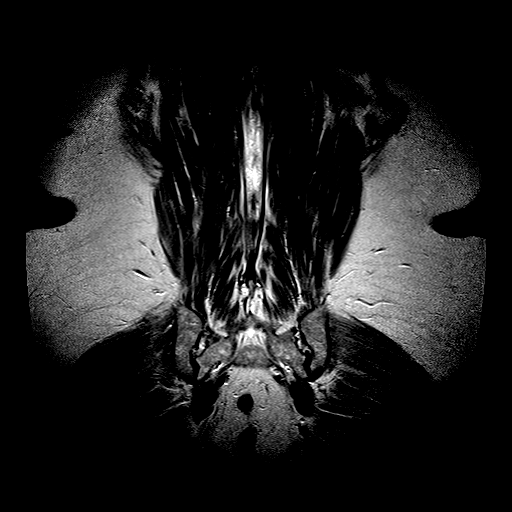
[im 6/22]
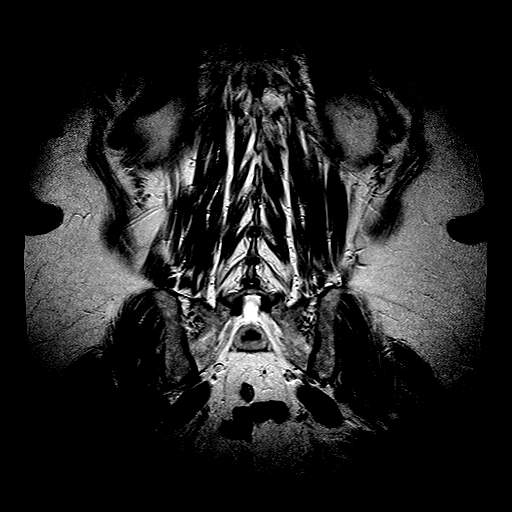
[im 8/22]
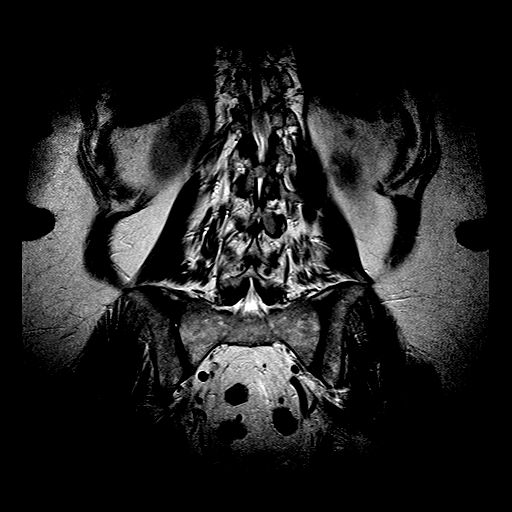
[im 11/22]
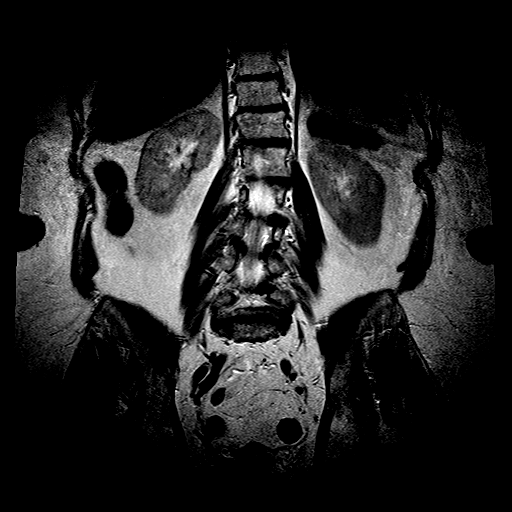
[im 14/22]
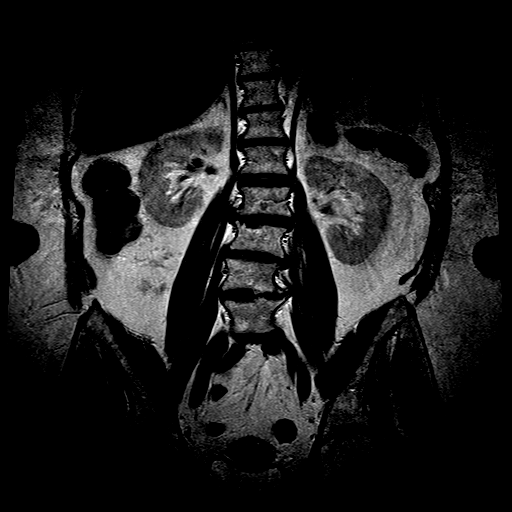
[im 16/22]
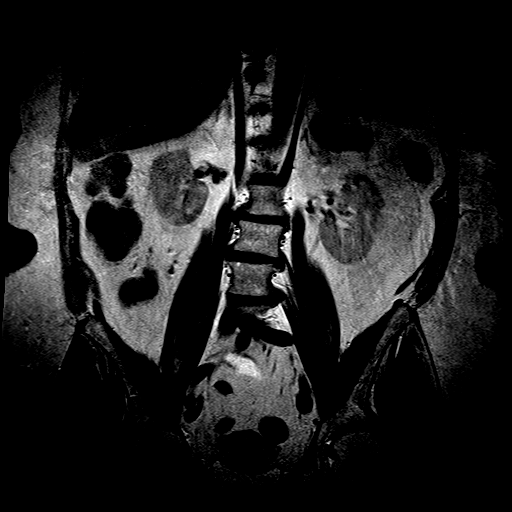
[im 19/22]
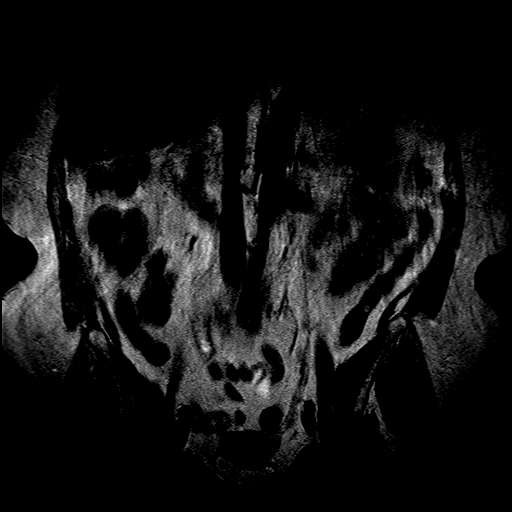
[im 22/22]
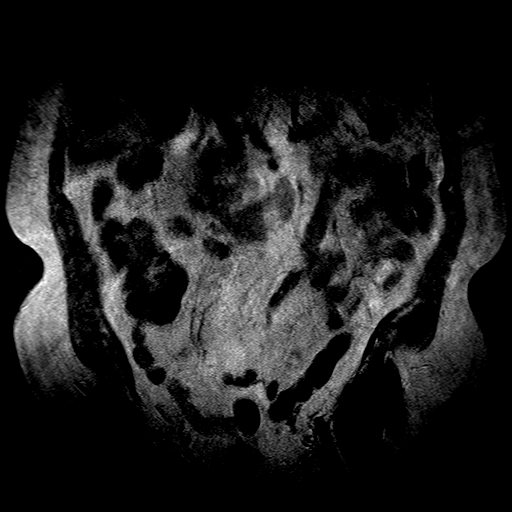

[30 of 48 positions shown; findings below may reference images not displayed]

FINDINGS: There is a mild levoscoliosis centered at L1-2 disc space level. Bone marrow signal intensity is normal. There is no acute fracture or subluxation. Distal spinal cord is normal in signal intensity and terminates normally at T12-L1 disc space level. Spinal canal is congenitally narrow.

L1-2 level is unremarkable.

At L2-3 level, there is moderate disc desiccation. There is also minimal retrolisthesis of L2 on L3 vertebral body. There is a small broad-based central disc bulge resulting in moderate to severe spinal stenosis. There is moderate bilateral neural foraminal stenosis from facet arthropathy and bulging annulus.

At L3-4 level, there is a small broad-based central disc bulge resulting in severe spinal stenosis. There is moderate to severe left and moderate right neural foraminal stenosis from facet arthropathy and bulging annulus.

At L4-5 level, there is moderate left and mild right neural foraminal stenosis from facet arthropathy and bulging annulus.

At L5-S1 level, there is marked disc desiccation. There is grade 1 anterolisthesis of L5 on S1 vertebral body. There is no significant disc herniation. There is severe bilateral neural foraminal stenosis from facet arthropathy and anterolisthesis.

Paraspinal soft tissues are unremarkable.
IMPRESSION: 1. Mild levoscoliosis centered at L1-2 disc space level. There is minimal retrolisthesis of L2 on L3 vertebral body and grade 1 anterolisthesis of L5 on S1 vertebral body. 

2. Moderate to severe spinal stenosis at L2-3 level and severe spinal stenosis at L3-4 level from small central disc bulges. 

3. Multilevel neural foraminal stenosis as detailed above.

## 2020-09-14 ENCOUNTER — Ambulatory Visit (INDEPENDENT_AMBULATORY_CARE_PROVIDER_SITE_OTHER): Payer: 59 | Admitting: Hematology & Oncology

## 2020-09-14 ENCOUNTER — Encounter (INDEPENDENT_AMBULATORY_CARE_PROVIDER_SITE_OTHER): Payer: Self-pay | Admitting: Hematology & Oncology

## 2020-09-14 ENCOUNTER — Other Ambulatory Visit: Payer: Self-pay

## 2020-09-14 VITALS — BP 162/90 | HR 104 | Temp 97.4°F | Ht 63.0 in | Wt 221.0 lb

## 2020-09-14 DIAGNOSIS — C50911 Malignant neoplasm of unspecified site of right female breast: Secondary | ICD-10-CM

## 2020-09-14 MED ORDER — LETROZOLE 2.5 MG TABLET
2.5000 mg | ORAL_TABLET | Freq: Every day | ORAL | 3 refills | Status: DC
Start: 2020-09-14 — End: 2022-06-03

## 2020-09-14 NOTE — Nursing Note (Signed)
RECHECK, NO C/O VOICED, NEEDS SCHEULED FOR MAMMO IN MARCH 2022

## 2020-09-14 NOTE — Cancer Center Note (Addendum)
HEMATOLOGY/ONCOLOGY, CANCER CARE [place]   [address]  [place]  [address]  [facility]     Progress Note    Name: Melanie Snyder MRN:  [id number]   Date: 09/14/2020 DOB: 09/26/1955       Referring Physician: Shirlee More, MD  Primary Care Provider: Peri Maris, DO    Chief Complaint   Patient presents with    Breast Cancer       Subjective:  Patient is now 1 year 10 months from diagnosis of breast CA.  She is on Femara on doing well.    ROS:     Review of Systems   All other systems reviewed and are negative.       Objective:   BP (!) 162/90 (Site: Left, Patient Position: Sitting, Cuff Size: Adult)    Pulse (!) 104    Temp 36.3 C (97.4 F) (Skin)    Ht 1.6 m ('5\' 3"' )    Wt 100 kg (221 lb)    BMI 39.15 kg/m       Physical Exam  Constitutional:       Appearance: Normal appearance.   HENT:      Head: Normocephalic.      Nose: Nose normal.      Mouth/Throat:      Mouth: Mucous membranes are moist.   Eyes:      Pupils: Pupils are equal, round, and reactive to light.   Cardiovascular:      Rate and Rhythm: Normal rate and regular rhythm.      Pulses: Normal pulses.      Heart sounds: Normal heart sounds.   Pulmonary:      Breath sounds: Normal breath sounds.   Chest:      Comments: Left breast is smaller than right breast.  Area of lumpectomy on the left breast lower inner quadrant and axillary dissection on the upper outer quadrant is well healed there is moderate fibrosis and entire breast and skin thickening    The right breast is pendulous but no masses or discharges bilaterally no adenopathy bilaterally  Abdominal:      General: Abdomen is flat. Bowel sounds are normal.      Palpations: Abdomen is soft.   Musculoskeletal:         General: Normal range of motion.   Skin:     General: Skin is warm.   Neurological:      General: No focal deficit present.      Mental Status: She is alert.   Psychiatric:         Mood and Affect: Mood normal.          ECOG Status: 0 - Fully active,  able to carry on all pre-disease performance without restriction.       Current Outpatient Medications   Medication Sig    calcium carbonate/vitamin D3 (CALTRATE-600 PLUS VITAMIN D3 ORAL) Caltrate 600 plus D    denosumab (PROLIA) 60 mg/mL Subcutaneous Syringe subQ injection 1 mL    letrozole (FEMARA) 2.5 mg Oral Tablet Take 1 Tablet (2.5 mg total) by mouth Once a day    levothyroxine (SYNTHROID) 112 mcg Oral Tablet levothyroxine 112 mcg tablet    lidocaine-prilocaine (EMLA) 2.5-2.5 % Cream APPLY CREAM TOPICALLY TO AFFECTED AREA TWICE DAILY AS NEEDED    meloxicam (MOBIC) 15 mg Oral Tablet Take 15 mg by mouth Once a day    pravastatin (PRAVACHOL) 40 mg Oral Tablet Take 40 mg by mouth  Allergies as of 09/14/2020 - Reviewed 09/14/2020   Allergen Reaction Noted    Amoxicillin  09/14/2020       Labs:  05/05/2020  white count of 8300 hemoglobin 15.4 hematocrit 44.9 a platelet count of 232,000   liver profile within normal limits    Radiology:   Patient is had a quad scan right upper extremity 06/30/2020 that showed no venous thrombosis    Assessment:     1. 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 Hyperlipidemia    4. Cardiac arrhythmia    5. Hypothyroidism    6. Osteopenia       Plan:     Patient is doing well she is do a CBC, liver profile, CA 27.29.  She also be due a chest x-ray and mammogram 10/2020.  We will get that arranged.  She is to continue on Femara.    The patient has been  scheduled to see Dr. Pierre Bali for breast reduction under left    breast 01/2021.    I will see her back in 3 months.  A total of 20 minutes was spent on this consultation with >50% spent in education, counseling of patient on diagnosis and treatment and in coordination of care.  All questions presented by patient on this visit were satisfactorily answered.      Return in about 6 months (around 03/14/2021).    Unice Cobble, MD      CC:  PCP General:  Peri Maris, DO  [place] [address]      Portions of this note may be dictated using voice recognition software or a dictation service. Variances in spelling and vocabulary are possible and unintentional. Not all errors are caught/corrected. Please notify the [facility] if any discrepancies are noted or if the meaning of any statement is not clear.

## 2020-11-09 ENCOUNTER — Telehealth (INDEPENDENT_AMBULATORY_CARE_PROVIDER_SITE_OTHER): Payer: Self-pay | Admitting: Hematology & Oncology

## 2020-11-09 NOTE — Telephone Encounter (Signed)
Pt aware that mammogram is negative

## 2021-02-19 ENCOUNTER — Ambulatory Visit (HOSPITAL_COMMUNITY): Payer: Self-pay | Admitting: PLASTIC AND RECONSTRUCTIVE SURGERY

## 2021-03-08 ENCOUNTER — Other Ambulatory Visit: Payer: Self-pay

## 2021-03-08 ENCOUNTER — Encounter (INDEPENDENT_AMBULATORY_CARE_PROVIDER_SITE_OTHER): Payer: Self-pay | Admitting: Family

## 2021-03-08 ENCOUNTER — Ambulatory Visit (INDEPENDENT_AMBULATORY_CARE_PROVIDER_SITE_OTHER): Payer: 59 | Admitting: Hematology & Oncology

## 2021-03-08 VITALS — BP 155/92 | HR 84 | Temp 98.2°F | Ht 63.0 in | Wt 220.4 lb

## 2021-03-08 DIAGNOSIS — C50919 Malignant neoplasm of unspecified site of unspecified female breast: Secondary | ICD-10-CM

## 2021-03-08 MED ORDER — LETROZOLE 2.5 MG TABLET
2.5000 mg | ORAL_TABLET | Freq: Every day | ORAL | 3 refills | Status: AC
Start: 2021-03-08 — End: 2021-06-06

## 2021-03-08 NOTE — Nursing Note (Signed)
Pt here for a follow up   Had breast reduction of the left breast Monday 03-01-21 at [place]

## 2021-03-08 NOTE — Cancer Center Note (Addendum)
HEMATOLOGY/ONCOLOGY, [place]   [place] [address]  [facility]     Progress Note    Name: Melanie Snyder MRN:  [id number]   Date: 03/08/2021 DOB: [birth year]       Referring Physician: Shirlee More, MD  Primary Care Provider: Peri Maris, DO    Chief Complaint   Patient presents with    Breast Cancer       Subjective:  [place] follow-up of patient with breast C A. Patient is currently on Femara 2.5 mg p.o. daily she is tolerating it very well she remarks that she feels much better on Femara that she has in a long time.    Patient also underwent breast reduction surgery on the left 03/01/2020 which is very pleased with the cosmetic result.    ROS:     Review of Systems   All other systems reviewed and are negative.       Objective:   BP (!) 155/92 (Site: Right, Patient Position: Sitting, Cuff Size: Adult)    Pulse 84    Temp 36.8 C (98.2 F) (Skin)    Ht 1.6 m ('5\' 3"' )    Wt 100 kg (220 lb 6.4 oz)    BMI 39.04 kg/m       Physical Exam  Constitutional:       Appearance: Normal appearance.   HENT:      Head: Normocephalic.      Nose: Nose normal.      Mouth/Throat:      Mouth: Mucous membranes are moist.   Eyes:      Pupils: Pupils are equal, round, and reactive to light.   Cardiovascular:      Rate and Rhythm: Normal rate and regular rhythm.      Pulses: Normal pulses.      Heart sounds: Normal heart sounds.   Pulmonary:      Breath sounds: Normal breath sounds.   Chest:      Comments: Status post breast reduction on the left.  The symmetry is very close to perfect there is still some erythema on the incision sites    Right breast is free of masses discharges no axillary adenopathy bilaterally  Abdominal:      General: Abdomen is flat. Bowel sounds are normal.      Palpations: Abdomen is soft.   Musculoskeletal:         General: Normal range of motion.   Skin:     General: Skin is warm.   Neurological:      General: No  focal deficit present.      Mental Status: She is alert.   Psychiatric:         Mood and Affect: Mood normal.          ECOG Status: 0 - Fully active, able to carry on all pre-disease performance without restriction.       Current Outpatient Medications   Medication Sig    calcium carbonate/vitamin D3 (CALTRATE-600 PLUS VITAMIN D3 ORAL) Caltrate 600 plus D    denosumab (PROLIA) 60 mg/mL Subcutaneous Syringe subQ injection 1 mL    fluticasone propionate (FLONASE) 50 mcg/actuation Nasal Spray, Suspension     letrozole (FEMARA) 2.5 mg Oral Tablet Take 1 Tablet (2.5 mg total) by mouth Once a day    letrozole (FEMARA) 2.5 mg Oral Tablet Take 1 Tablet (2.5 mg total) by mouth Once a day for 90 days Indications: hormone receptor positive  postmenopausal advanced breast cancer    levothyroxine (SYNTHROID) 112 mcg Oral Tablet levothyroxine 112 mcg tablet    lidocaine (XYLOCAINE) 5 % Ointment APPLY OINTMENT EXTERNALLY TO AFFECTED AREA TWICE DAILY AS NEEDED    lidocaine-prilocaine (EMLA) 2.5-2.5 % Cream APPLY CREAM TOPICALLY TO AFFECTED AREA TWICE DAILY AS NEEDED    meloxicam (MOBIC) 15 mg Oral Tablet Take 15 mg by mouth Once a day    pravastatin (PRAVACHOL) 40 mg Oral Tablet Take 40 mg by mouth     Allergies as of 03/08/2021 - Reviewed 03/08/2021   Allergen Reaction Noted    Amoxicillin  09/14/2020       Labs:  No new left    Radiology:   Mammogram 11/09/2020:  Post right lumpectomy changes as before stable benign-appearing left periareolar nodule.  No evidence of a new or recurrent malignancy    Chest x-ray the same date is negative    Assessment:     1. 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  She will need a bone densitometry study since her last 1 was in November 2019. She is on Prolia       A total of 20 minutes was spent on this consultation with >50% spent in education, counseling of patient on diagnosis and treatment and in coordination of care.  All questions presented by patient on this visit were satisfactorily answered.      Return in about 6 months (around 09/08/2021) for In Person Visit.    Unice Cobble, MD      CC:  PCP General:  Peri Maris, DO  [place] [address]      Portions of this note may be dictated using voice recognition software or a dictation service. Variances in spelling and vocabulary are possible and unintentional. Not all errors are caught/corrected. Please notify the [facility] if any discrepancies are noted or if the meaning of any statement is not clear.

## 2021-09-08 ENCOUNTER — Encounter (INDEPENDENT_AMBULATORY_CARE_PROVIDER_SITE_OTHER): Payer: Self-pay | Admitting: Hematology & Oncology

## 2021-09-08 ENCOUNTER — Other Ambulatory Visit: Payer: Self-pay

## 2021-09-08 ENCOUNTER — Ambulatory Visit (INDEPENDENT_AMBULATORY_CARE_PROVIDER_SITE_OTHER): Payer: 59 | Admitting: Hematology & Oncology

## 2021-09-08 VITALS — BP 146/94 | HR 110 | Temp 95.8°F | Ht 63.0 in | Wt 227.0 lb

## 2021-09-08 DIAGNOSIS — C50919 Malignant neoplasm of unspecified site of unspecified female breast: Secondary | ICD-10-CM

## 2021-09-08 MED ORDER — LETROZOLE 2.5 MG TABLET
2.5000 mg | ORAL_TABLET | Freq: Every day | ORAL | 3 refills | Status: AC
Start: 2021-09-08 — End: 2021-12-07

## 2021-09-08 NOTE — Nursing Note (Signed)
6 month recheck

## 2021-09-08 NOTE — Cancer Center Note (Signed)
HEMATOLOGY/ONCOLOGY, [place], [place]   [place] [address]  [facility]     Progress Note    Name: Melanie Snyder MRN:  [id number]   Date: 09/08/2021 DOB: [birth year]       Referring Physician: Shirlee More, MD  Primary Care Provider: Peri Maris, DO    Chief Complaint   Patient presents with    Follow Up 6 Months     H/o breast cancer       Subjective:  Oncologic follow-up of patient with history of infiltrating ductal carcinoma right breast.  She is now close to 3 years from diagnosis.  She is on Femara and doing well.  She has 2 years on Femara.  Start date 08/23/2019.  She is also on Prolia.    ROS:     Review of Systems   Constitutional: Negative.    HENT:  Negative.    Eyes: Negative.    Respiratory: Negative.    Cardiovascular: Negative.    Gastrointestinal: Negative.    Genitourinary: Negative.     Musculoskeletal: Negative.    Skin: Negative.    Neurological: Negative.    Hematological: Negative.         Objective:   BP (!) 146/94 (Site: Left, Patient Position: Sitting, Cuff Size: Adult)    Pulse (!) 110    Temp 35.4 C (95.8 F) (Skin)    Ht 1.6 m ('5\' 3"' )    Wt 103 kg (227 lb)    BMI 40.21 kg/m       Physical Exam  Constitutional:       Appearance: Normal appearance.      Comments: Slightly overweight   HENT:      Head: Normocephalic and atraumatic.      Nose: Nose normal.      Mouth/Throat:      Mouth: Mucous membranes are moist.      Pharynx: Oropharynx is clear.   Eyes:      Extraocular Movements: Extraocular movements intact.      Conjunctiva/sclera: Conjunctivae normal.      Pupils: Pupils are equal, round, and reactive to light.   Cardiovascular:      Rate and Rhythm: Normal rate and regular rhythm.      Pulses: Normal pulses.      Heart sounds: Normal heart sounds.   Pulmonary:      Effort: Pulmonary effort is normal.      Breath sounds: Normal breath sounds.   Chest:      Comments: Right and left breast close  to symmetrical no palpable masses or discharges bilaterally no adenopathy bilaterally  Abdominal:      General: Abdomen is flat. Bowel sounds are normal.      Palpations: Abdomen is soft.   Musculoskeletal:         General: Normal range of motion.      Cervical back: Normal range of motion and neck supple.   Skin:     General: Skin is warm.   Neurological:      General: No focal deficit present.      Mental Status: She is alert and oriented to person, place, and time. Mental status is at baseline.   Psychiatric:         Mood and Affect: Mood normal.         Behavior: Behavior normal.         Thought Content: Thought content normal.  Judgment: Judgment normal.          ECOG Status: 0 - Fully active, able to carry on all pre-disease performance without restriction.       Current Outpatient Medications   Medication Sig    calcium carbonate/vitamin D3 (CALTRATE-600 PLUS VITAMIN D3 ORAL) Caltrate 600 plus D    denosumab (PROLIA) 60 mg/mL Subcutaneous Syringe subQ injection 1 mL (60 mg total)    fluticasone propionate (FLONASE) 50 mcg/actuation Nasal Spray, Suspension     letrozole (FEMARA) 2.5 mg Oral Tablet Take 1 Tablet (2.5 mg total) by mouth Once a day    letrozole (FEMARA) 2.5 mg Oral Tablet Take 1 Tablet (2.5 mg total) by mouth Once a day for 90 days Indications: hormone receptor positive postmenopausal early breast cancer    levothyroxine (SYNTHROID) 112 mcg Oral Tablet levothyroxine 112 mcg tablet    lidocaine (XYLOCAINE) 5 % Ointment APPLY OINTMENT EXTERNALLY TO AFFECTED AREA TWICE DAILY AS NEEDED    lidocaine-prilocaine (EMLA) 2.5-2.5 % Cream APPLY CREAM TOPICALLY TO AFFECTED AREA TWICE DAILY AS NEEDED    meloxicam (MOBIC) 15 mg Oral Tablet Take 1 Tablet (15 mg total) by mouth Once a day    pravastatin (PRAVACHOL) 40 mg Oral Tablet Take 1 Tablet (40 mg total) by mouth     Allergies as of 09/08/2021 - Reviewed 09/08/2021   Allergen Reaction Noted    Amoxicillin  09/14/2020        Labs:  03/10/2021 white count of 7100 hemoglobin of 13.9 hematocrit of 40.7 a platelet count 206000   CA 27.29 of 12   Electrolytes within normal limits BUN of 13 creatinine 0.7 GFR greater than 59 corrected calcium of 8.9   Liver profile within normal limits       Radiology:   Mammogram 11/09/2020 shows post right lumpectomy changes.  Stable benign-appearing left periareolar nodule.  No evidence of new or recurrent malignancy.    Bone densitometry study 04/29/2020 shows osteopenia of the lumbar spine, the proximal left femur, the right femoral neck with T-score lumbar spine of -1, proximal left femur of -1, right proximal femur of -0.5    Assessment:     1. 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  She is do a CBC liver profile CA 27.29.  She will be due a mammogram end of October 14, 2021+ a chest x-ray and a bone densitometry study 04/2022.  She is to continue on Femara.      A total of 20  minutes was spent on this consultation with >50% spent in education, counseling of patient on diagnosis and treatment and in coordination of care.  All questions presented by patient on this visit were satisfactorily answered.      Return in about 6 months (around 03/08/2022) for f/u with Cami.    Celica Kotowski  Wilford Corner, MD      CC:  PCP General:  Peri Maris, DO  [place] [id number]      Portions of this note may be dictated using voice recognition software or a dictation service. Variances in spelling and vocabulary are possible and unintentional. Not all errors are caught/corrected. Please notify the [facility] if any discrepancies are noted or if the meaning of any statement is not clear.

## 2021-09-10 ENCOUNTER — Other Ambulatory Visit (INDEPENDENT_AMBULATORY_CARE_PROVIDER_SITE_OTHER): Payer: Self-pay | Admitting: NURSE PRACTITIONER

## 2021-09-10 DIAGNOSIS — C50911 Malignant neoplasm of unspecified site of right female breast: Secondary | ICD-10-CM

## 2021-09-24 ENCOUNTER — Other Ambulatory Visit (INDEPENDENT_AMBULATORY_CARE_PROVIDER_SITE_OTHER): Payer: Self-pay | Admitting: NURSE PRACTITIONER

## 2021-09-24 ENCOUNTER — Other Ambulatory Visit (HOSPITAL_COMMUNITY): Payer: Self-pay | Admitting: NURSE PRACTITIONER

## 2021-09-24 DIAGNOSIS — C50919 Malignant neoplasm of unspecified site of unspecified female breast: Secondary | ICD-10-CM

## 2021-09-27 ENCOUNTER — Other Ambulatory Visit (INDEPENDENT_AMBULATORY_CARE_PROVIDER_SITE_OTHER): Payer: Self-pay | Admitting: NURSE PRACTITIONER

## 2021-09-27 DIAGNOSIS — C50919 Malignant neoplasm of unspecified site of unspecified female breast: Secondary | ICD-10-CM

## 2021-10-04 ENCOUNTER — Other Ambulatory Visit (INDEPENDENT_AMBULATORY_CARE_PROVIDER_SITE_OTHER): Payer: Self-pay | Admitting: NURSE PRACTITIONER

## 2021-10-04 DIAGNOSIS — C50919 Malignant neoplasm of unspecified site of unspecified female breast: Secondary | ICD-10-CM

## 2021-10-09 ENCOUNTER — Other Ambulatory Visit (INDEPENDENT_AMBULATORY_CARE_PROVIDER_SITE_OTHER): Payer: Self-pay | Admitting: Surgery

## 2021-10-09 DIAGNOSIS — Z853 Personal history of malignant neoplasm of breast: Secondary | ICD-10-CM

## 2021-10-09 DIAGNOSIS — Z9889 Other specified postprocedural states: Secondary | ICD-10-CM

## 2021-10-13 ENCOUNTER — Ambulatory Visit (INDEPENDENT_AMBULATORY_CARE_PROVIDER_SITE_OTHER): Payer: Self-pay | Admitting: Surgery

## 2021-11-05 ENCOUNTER — Other Ambulatory Visit: Payer: 59 | Attending: Hematology & Oncology

## 2021-11-05 ENCOUNTER — Other Ambulatory Visit: Payer: Self-pay

## 2021-11-05 DIAGNOSIS — C50919 Malignant neoplasm of unspecified site of unspecified female breast: Secondary | ICD-10-CM | POA: Insufficient documentation

## 2021-11-05 LAB — CBC WITH DIFF
BASOPHIL #: 0 10*3/uL (ref 0.00–2.50)
BASOPHIL %: 0 % (ref 0–3)
EOSINOPHIL #: 0.1 10*3/uL (ref 0.00–2.40)
EOSINOPHIL %: 1 % (ref 0–7)
HCT: 43 % (ref 37.0–47.0)
HGB: 14.8 g/dL (ref 12.5–16.0)
LYMPHOCYTE #: 1.9 10*3/uL — ABNORMAL LOW (ref 2.10–11.00)
LYMPHOCYTE %: 19 % — ABNORMAL LOW (ref 25–45)
MCH: 34.8 pg — ABNORMAL HIGH (ref 27.0–32.0)
MCHC: 34.4 g/dL (ref 32.0–36.0)
MCV: 101 fL — ABNORMAL HIGH (ref 78.0–99.0)
MONOCYTE #: 1 10*3/uL (ref 0.00–4.10)
MONOCYTE %: 10 % (ref 0–12)
MPV: 9.8 fL (ref 7.4–10.4)
NEUTROPHIL #: 7.3 10*3/uL (ref 4.10–29.00)
NEUTROPHIL %: 71 % (ref 40–76)
PLATELET COMMENT: ADEQUATE
PLATELETS: 174 10*3/uL (ref 140–440)
RBC: 4.26 10*6/uL (ref 4.20–5.40)
RDW: 13 % (ref 11.6–14.8)
WBC: 10.3 10*3/uL (ref 4.0–10.5)
WBCS UNCORRECTED: 10.3 10*3/uL

## 2021-11-05 LAB — COMPREHENSIVE METABOLIC PANEL, NON-FASTING
ALBUMIN/GLOBULIN RATIO: 1.7 — ABNORMAL HIGH (ref 0.8–1.4)
ALBUMIN: 4.2 g/dL (ref 3.5–5.7)
ALKALINE PHOSPHATASE: 60 U/L (ref 34–104)
ALT (SGPT): 23 U/L (ref 7–52)
ANION GAP: 6 mmol/L — ABNORMAL LOW (ref 10–20)
AST (SGOT): 25 U/L (ref 13–39)
BILIRUBIN TOTAL: 0.7 mg/dL (ref 0.3–1.2)
BUN/CREA RATIO: 18 (ref 6–22)
BUN: 14 mg/dL (ref 7–25)
CALCIUM, CORRECTED: 9.1 mg/dL (ref 8.9–10.8)
CALCIUM: 9.3 mg/dL (ref 8.6–10.3)
CHLORIDE: 103 mmol/L (ref 98–107)
CO2 TOTAL: 29 mmol/L (ref 21–31)
CREATININE: 0.77 mg/dL (ref 0.60–1.30)
ESTIMATED GFR: 87 mL/min/{1.73_m2} (ref >59)
GLOBULIN: 2.5 — ABNORMAL LOW (ref 2.9–5.4)
GLUCOSE: 80 mg/dL (ref 74–109)
OSMOLALITY, CALCULATED: 275 mOsm/kg (ref 270–290)
POTASSIUM: 4.5 mmol/L (ref 3.5–5.1)
PROTEIN TOTAL: 6.7 g/dL (ref 6.4–8.9)
SODIUM: 138 mmol/L (ref 136–145)

## 2021-11-10 ENCOUNTER — Other Ambulatory Visit (INDEPENDENT_AMBULATORY_CARE_PROVIDER_SITE_OTHER): Payer: Self-pay | Admitting: Family

## 2021-11-10 ENCOUNTER — Inpatient Hospital Stay
Admission: RE | Admit: 2021-11-10 | Discharge: 2021-11-10 | Disposition: A | Discharge status: Home / Self Care | Payer: 59 | Source: Ambulatory Visit | Attending: NURSE PRACTITIONER | Admitting: NURSE PRACTITIONER

## 2021-11-10 ENCOUNTER — Encounter (HOSPITAL_COMMUNITY): Payer: Self-pay

## 2021-11-10 ENCOUNTER — Inpatient Hospital Stay (HOSPITAL_BASED_OUTPATIENT_CLINIC_OR_DEPARTMENT_OTHER)
Admission: RE | Admit: 2021-11-10 | Discharge: 2021-11-10 | Disposition: A | Discharge status: Home / Self Care | Payer: 59 | Source: Ambulatory Visit

## 2021-11-10 ENCOUNTER — Other Ambulatory Visit: Payer: Self-pay

## 2021-11-10 DIAGNOSIS — C50919 Malignant neoplasm of unspecified site of unspecified female breast: Secondary | ICD-10-CM | POA: Insufficient documentation

## 2021-11-11 ENCOUNTER — Other Ambulatory Visit: Payer: Self-pay

## 2021-11-11 ENCOUNTER — Ambulatory Visit (HOSPITAL_COMMUNITY): Payer: Self-pay

## 2021-11-30 ENCOUNTER — Other Ambulatory Visit (INDEPENDENT_AMBULATORY_CARE_PROVIDER_SITE_OTHER): Payer: Self-pay | Admitting: Family

## 2021-11-30 MED ORDER — DENOSUMAB 60 MG/ML SUBCUTANEOUS SYRINGE
60.0000 mg | INJECTION | SUBCUTANEOUS | Status: AC
Start: 2022-01-11 — End: 2023-01-05

## 2021-12-06 ENCOUNTER — Ambulatory Visit (HOSPITAL_COMMUNITY): Payer: Self-pay

## 2022-01-11 ENCOUNTER — Encounter (HOSPITAL_COMMUNITY): Payer: Self-pay

## 2022-01-11 ENCOUNTER — Other Ambulatory Visit: Payer: Self-pay

## 2022-01-11 ENCOUNTER — Ambulatory Visit (HOSPITAL_COMMUNITY): Payer: Self-pay

## 2022-01-11 ENCOUNTER — Inpatient Hospital Stay
Admission: RE | Admit: 2022-01-11 | Discharge: 2022-01-11 | Disposition: A | Discharge status: Still In Hospital | Payer: 59 | Source: Ambulatory Visit | Attending: Family Medicine | Admitting: Family Medicine

## 2022-01-11 VITALS — BP 138/78 | HR 95 | Temp 96.1°F | Resp 18

## 2022-01-11 DIAGNOSIS — N959 Unspecified menopausal and perimenopausal disorder: Secondary | ICD-10-CM | POA: Insufficient documentation

## 2022-01-11 DIAGNOSIS — C50919 Malignant neoplasm of unspecified site of unspecified female breast: Secondary | ICD-10-CM

## 2022-01-11 DIAGNOSIS — M8580 Other specified disorders of bone density and structure, unspecified site: Secondary | ICD-10-CM | POA: Insufficient documentation

## 2022-01-11 HISTORY — DX: Malignant neoplasm of unspecified site of unspecified female breast: C50.919

## 2022-01-11 MED ORDER — DENOSUMAB 60 MG/ML SUBCUTANEOUS SYRINGE
INJECTION | SUBCUTANEOUS | Status: AC
Start: 2022-01-11 — End: 2022-01-11
  Filled 2022-01-11: qty 1

## 2022-01-11 MED ORDER — DENOSUMAB 60 MG/ML SUBCUTANEOUS SYRINGE
60.0000 mg | INJECTION | Freq: Once | SUBCUTANEOUS | Status: AC
Start: 2022-01-11 — End: 2022-01-11
  Administered 2022-01-11: 60 mg via SUBCUTANEOUS

## 2022-01-17 ENCOUNTER — Other Ambulatory Visit (HOSPITAL_COMMUNITY): Payer: Self-pay | Admitting: RADIATION ONCOLOGY

## 2022-01-17 DIAGNOSIS — C50919 Malignant neoplasm of unspecified site of unspecified female breast: Secondary | ICD-10-CM

## 2022-02-11 ENCOUNTER — Inpatient Hospital Stay
Admission: RE | Admit: 2022-02-11 | Discharge: 2022-02-11 | Disposition: A | Discharge status: Home / Self Care | Payer: 59 | Source: Ambulatory Visit | Attending: RADIATION ONCOLOGY | Admitting: RADIATION ONCOLOGY

## 2022-02-11 ENCOUNTER — Other Ambulatory Visit (HOSPITAL_BASED_OUTPATIENT_CLINIC_OR_DEPARTMENT_OTHER): Payer: 59

## 2022-02-11 ENCOUNTER — Other Ambulatory Visit: Payer: Self-pay

## 2022-02-11 DIAGNOSIS — C50911 Malignant neoplasm of unspecified site of right female breast: Secondary | ICD-10-CM | POA: Insufficient documentation

## 2022-02-11 DIAGNOSIS — C50919 Malignant neoplasm of unspecified site of unspecified female breast: Secondary | ICD-10-CM | POA: Insufficient documentation

## 2022-02-11 LAB — CREATININE WITH EGFR
CREATININE: 0.77 mg/dL (ref 0.55–1.02)
ESTIMATED GFR: 87 mL/min/{1.73_m2} (ref >59)

## 2022-02-11 MED ORDER — IOHEXOL 350 MG IODINE/ML INTRAVENOUS SOLUTION
100.0000 mL | INTRAVENOUS | Status: AC
Start: 2022-02-11 — End: 2022-02-11
  Administered 2022-02-11: 75 mL via INTRAVENOUS

## 2022-02-16 ENCOUNTER — Telehealth (INDEPENDENT_AMBULATORY_CARE_PROVIDER_SITE_OTHER): Payer: Self-pay | Admitting: Hematology & Oncology

## 2022-02-16 NOTE — Telephone Encounter (Signed)
Called pt to discuss moving f/u appt d/t no provider in clinic on 7/25.  Pt is agreeable to new appt on 04/05/22 at 330pm.

## 2022-03-08 ENCOUNTER — Ambulatory Visit (INDEPENDENT_AMBULATORY_CARE_PROVIDER_SITE_OTHER): Payer: Self-pay | Admitting: Hematology & Oncology

## 2022-03-22 ENCOUNTER — Telehealth (INDEPENDENT_AMBULATORY_CARE_PROVIDER_SITE_OTHER): Payer: Self-pay | Admitting: MEDICAL ONCOLOGY

## 2022-03-22 NOTE — Telephone Encounter (Signed)
Left pt a voicemail regarding change of appt date to 06-03-22 @ 8 am.

## 2022-04-05 ENCOUNTER — Ambulatory Visit (INDEPENDENT_AMBULATORY_CARE_PROVIDER_SITE_OTHER): Payer: Self-pay | Admitting: MEDICAL ONCOLOGY

## 2022-04-27 ENCOUNTER — Ambulatory Visit (HOSPITAL_COMMUNITY): Payer: Self-pay

## 2022-05-02 ENCOUNTER — Other Ambulatory Visit (HOSPITAL_COMMUNITY): Payer: Self-pay

## 2022-05-04 ENCOUNTER — Inpatient Hospital Stay
Admission: RE | Admit: 2022-05-04 | Discharge: 2022-05-04 | Disposition: A | Discharge status: Home / Self Care | Payer: 59 | Source: Ambulatory Visit | Attending: NURSE PRACTITIONER | Admitting: NURSE PRACTITIONER

## 2022-05-04 ENCOUNTER — Other Ambulatory Visit: Payer: Self-pay

## 2022-05-04 DIAGNOSIS — C50919 Malignant neoplasm of unspecified site of unspecified female breast: Secondary | ICD-10-CM | POA: Insufficient documentation

## 2022-05-23 ENCOUNTER — Telehealth (INDEPENDENT_AMBULATORY_CARE_PROVIDER_SITE_OTHER): Payer: Self-pay | Admitting: MEDICAL ONCOLOGY

## 2022-05-23 ENCOUNTER — Other Ambulatory Visit (INDEPENDENT_AMBULATORY_CARE_PROVIDER_SITE_OTHER): Payer: Self-pay | Admitting: MEDICAL ONCOLOGY

## 2022-05-23 DIAGNOSIS — C50919 Malignant neoplasm of unspecified site of unspecified female breast: Secondary | ICD-10-CM

## 2022-05-23 NOTE — Telephone Encounter (Signed)
Spoke with pt about having labs done prior to appt her appt on 06-03-22. Pt said she would go in a couple of days to have labs done.

## 2022-05-28 ENCOUNTER — Other Ambulatory Visit: Payer: Self-pay

## 2022-05-28 ENCOUNTER — Other Ambulatory Visit: Payer: 59 | Attending: MEDICAL ONCOLOGY

## 2022-05-28 DIAGNOSIS — C50919 Malignant neoplasm of unspecified site of unspecified female breast: Secondary | ICD-10-CM | POA: Insufficient documentation

## 2022-05-28 LAB — COMPREHENSIVE METABOLIC PANEL, NON-FASTING
ALBUMIN/GLOBULIN RATIO: 1.7 — ABNORMAL HIGH (ref 0.8–1.4)
ALBUMIN: 4.4 g/dL (ref 3.5–5.7)
ALKALINE PHOSPHATASE: 80 U/L (ref 34–104)
ALT (SGPT): 20 U/L (ref 7–52)
ANION GAP: 9 mmol/L (ref 4–13)
AST (SGOT): 26 U/L (ref 13–39)
BILIRUBIN TOTAL: 0.6 mg/dL (ref 0.3–1.2)
BUN/CREA RATIO: 16 (ref 6–22)
BUN: 12 mg/dL (ref 7–25)
CALCIUM, CORRECTED: 8.7 mg/dL — ABNORMAL LOW (ref 8.9–10.8)
CALCIUM: 9.1 mg/dL (ref 8.6–10.3)
CHLORIDE: 105 mmol/L (ref 98–107)
CO2 TOTAL: 25 mmol/L (ref 21–31)
CREATININE: 0.74 mg/dL (ref 0.60–1.30)
ESTIMATED GFR: 91 mL/min/{1.73_m2} (ref >59)
GLOBULIN: 2.6 — ABNORMAL LOW (ref 2.9–5.4)
GLUCOSE: 87 mg/dL (ref 74–109)
OSMOLALITY, CALCULATED: 277 mOsm/kg (ref 270–290)
POTASSIUM: 3.5 mmol/L (ref 3.5–5.1)
PROTEIN TOTAL: 7 g/dL (ref 6.4–8.9)
SODIUM: 139 mmol/L (ref 136–145)

## 2022-05-28 LAB — CBC WITH DIFF
BASOPHIL #: 0 10*3/uL (ref 0.00–0.10)
BASOPHIL %: 0 % (ref 0–1)
EOSINOPHIL #: 0.2 10*3/uL (ref 0.00–0.50)
EOSINOPHIL %: 2 %
HCT: 40.6 % (ref 31.2–41.9)
HGB: 14.2 g/dL (ref 10.9–14.3)
LYMPHOCYTE #: 1.2 10*3/uL (ref 1.00–3.00)
LYMPHOCYTE %: 17 % (ref 16–44)
MCH: 35 pg — ABNORMAL HIGH (ref 24.7–32.8)
MCHC: 34.9 g/dL (ref 32.3–35.6)
MCV: 100.3 fL — ABNORMAL HIGH (ref 75.5–95.3)
MONOCYTE #: 0.6 10*3/uL (ref 0.30–1.00)
MONOCYTE %: 8 % (ref 5–13)
MPV: 9.1 fL (ref 7.9–10.8)
NEUTROPHIL #: 5 10*3/uL (ref 1.85–7.80)
NEUTROPHIL %: 73 % (ref 43–77)
PLATELETS: 191 10*3/uL (ref 140–440)
RBC: 4.05 10*6/uL (ref 3.63–4.92)
RDW: 13.3 % (ref 12.3–17.7)
WBC: 6.9 10*3/uL (ref 3.8–11.8)

## 2022-06-03 ENCOUNTER — Other Ambulatory Visit (INDEPENDENT_AMBULATORY_CARE_PROVIDER_SITE_OTHER): Payer: Self-pay | Admitting: MEDICAL ONCOLOGY

## 2022-06-03 ENCOUNTER — Encounter (INDEPENDENT_AMBULATORY_CARE_PROVIDER_SITE_OTHER): Payer: Self-pay | Admitting: MEDICAL ONCOLOGY

## 2022-06-03 ENCOUNTER — Ambulatory Visit: Payer: 59 | Attending: MEDICAL ONCOLOGY | Admitting: MEDICAL ONCOLOGY

## 2022-06-03 ENCOUNTER — Other Ambulatory Visit: Payer: Self-pay

## 2022-06-03 VITALS — BP 146/71 | HR 92 | Temp 97.2°F | Ht 63.0 in | Wt 218.0 lb

## 2022-06-03 DIAGNOSIS — E039 Hypothyroidism, unspecified: Secondary | ICD-10-CM | POA: Insufficient documentation

## 2022-06-03 DIAGNOSIS — Z923 Personal history of irradiation: Secondary | ICD-10-CM | POA: Insufficient documentation

## 2022-06-03 DIAGNOSIS — Z79899 Other long term (current) drug therapy: Secondary | ICD-10-CM | POA: Insufficient documentation

## 2022-06-03 DIAGNOSIS — Z9889 Other specified postprocedural states: Secondary | ICD-10-CM | POA: Insufficient documentation

## 2022-06-03 DIAGNOSIS — Z853 Personal history of malignant neoplasm of breast: Secondary | ICD-10-CM | POA: Insufficient documentation

## 2022-06-03 DIAGNOSIS — I499 Cardiac arrhythmia, unspecified: Secondary | ICD-10-CM | POA: Insufficient documentation

## 2022-06-03 DIAGNOSIS — C50919 Malignant neoplasm of unspecified site of unspecified female breast: Secondary | ICD-10-CM

## 2022-06-03 DIAGNOSIS — E785 Hyperlipidemia, unspecified: Secondary | ICD-10-CM | POA: Insufficient documentation

## 2022-06-03 DIAGNOSIS — M8580 Other specified disorders of bone density and structure, unspecified site: Secondary | ICD-10-CM | POA: Insufficient documentation

## 2022-06-03 DIAGNOSIS — Z08 Encounter for follow-up examination after completed treatment for malignant neoplasm: Secondary | ICD-10-CM | POA: Insufficient documentation

## 2022-06-03 DIAGNOSIS — Z9011 Acquired absence of right breast and nipple: Secondary | ICD-10-CM | POA: Insufficient documentation

## 2022-06-03 DIAGNOSIS — G629 Polyneuropathy, unspecified: Secondary | ICD-10-CM

## 2022-06-03 DIAGNOSIS — Z170 Estrogen receptor positive status [ER+]: Secondary | ICD-10-CM | POA: Insufficient documentation

## 2022-06-03 DIAGNOSIS — G620 Drug-induced polyneuropathy: Secondary | ICD-10-CM | POA: Insufficient documentation

## 2022-06-03 DIAGNOSIS — C50911 Malignant neoplasm of unspecified site of right female breast: Secondary | ICD-10-CM

## 2022-06-03 DIAGNOSIS — Z79811 Long term (current) use of aromatase inhibitors: Secondary | ICD-10-CM | POA: Insufficient documentation

## 2022-06-03 MED ORDER — LETROZOLE 2.5 MG TABLET
2.5000 mg | ORAL_TABLET | Freq: Every day | ORAL | 3 refills | Status: DC
Start: 2022-06-03 — End: 2022-11-17

## 2022-06-03 NOTE — Progress Notes (Signed)
HEMATOLOGY/ONCOLOGY, [facility]  [address]  [place]  [address]  Operated by [facility]     Progress Note    Name: Melanie Snyder MRN:  [id number]   Date: 06/03/2022 DOB: 08-23-1955       Referring Physician: Shirlee More, MD  Primary Care Provider: Peri Maris, DO    Subjective:  Oncologic follow-up of patient with history of infiltrating ductal carcinoma right breast.     ROS:     Review of Systems   Constitutional: Negative.    HENT:  Negative.     Eyes: Negative.    Respiratory: Negative.     Cardiovascular: Negative.    Gastrointestinal: Negative.    Genitourinary: Negative.     Musculoskeletal: Negative.    Skin: Negative.    Neurological: Negative.    Hematological: Negative.       Objective:   There were no vitals taken for this visit.    Physical Exam  Constitutional:       Appearance: Normal appearance.      Comments: Slightly overweight   HENT:      Head: Normocephalic and atraumatic.      Nose: Nose normal.      Mouth/Throat:      Mouth: Mucous membranes are moist.      Pharynx: Oropharynx is clear.   Eyes:      Extraocular Movements: Extraocular movements intact.      Conjunctiva/sclera: Conjunctivae normal.      Pupils: Pupils are equal, round, and reactive to light.   Cardiovascular:      Rate and Rhythm: Normal rate and regular rhythm.      Pulses: Normal pulses.      Heart sounds: Normal heart sounds.   Pulmonary:      Effort: Pulmonary effort is normal.      Breath sounds: Normal breath sounds.   Chest:      Comments: Right and left breast close to symmetrical no palpable masses or discharges bilaterally no adenopathy bilaterally  Abdominal:      General: Abdomen is flat. Bowel sounds are normal.      Palpations: Abdomen is soft.   Musculoskeletal:         General: Normal range of motion.      Cervical back: Normal range of motion and neck supple.   Skin:     General: Skin is warm.   Neurological:      General: No focal deficit present.       Mental Status: She is alert and oriented to person, place, and time. Mental status is at baseline.   Psychiatric:         Mood and Affect: Mood normal.         Behavior: Behavior normal.         Thought Content: Thought content normal.         Judgment: Judgment normal.        ECOG Status: 0 - Fully active, able to carry on all pre-disease performance without restriction.       Current Outpatient Medications   Medication Sig    calcium carbonate/vitamin D3 (CALTRATE-600 PLUS VITAMIN D3 ORAL) Caltrate 600 plus D    fluticasone propionate (FLONASE) 50 mcg/actuation Nasal Spray, Suspension     letrozole (FEMARA) 2.5 mg Oral Tablet Take 1 Tablet (2.5 mg total) by mouth Once a day    levothyroxine (SYNTHROID) 112 mcg Oral Tablet levothyroxine 112 mcg tablet    lidocaine (  XYLOCAINE) 5 % Ointment APPLY OINTMENT EXTERNALLY TO AFFECTED AREA TWICE DAILY AS NEEDED    lidocaine-prilocaine (EMLA) 2.5-2.5 % Cream APPLY CREAM TOPICALLY TO AFFECTED AREA TWICE DAILY AS NEEDED    meloxicam (MOBIC) 15 mg Oral Tablet Take 1 Tablet (15 mg total) by mouth Once a day    pravastatin (PRAVACHOL) 40 mg Oral Tablet Take 1 Tablet (40 mg total) by mouth     Allergies as of 06/03/2022 - Reviewed 01/11/2022   Allergen Reaction Noted    Amoxicillin  09/14/2020     Labs:  03/10/2021 white count of 7100 hemoglobin of 13.9 hematocrit of 40.7 a platelet count 206000   CA 27.29 of 12   Electrolytes within normal limits BUN of 13 creatinine 0.7 GFR greater than 59 corrected calcium of 8.9   Liver profile within normal limits     Radiology:   Mammogram 11/09/2020 shows post right lumpectomy changes.  Stable benign-appearing left periareolar nodule.  No evidence of new or recurrent malignancy.    Bone densitometry study 04/29/2020 shows osteopenia of the lumbar spine, the proximal left femur, the right femoral neck with T-score lumbar spine of -1, proximal left femur of -1, right proximal femur of -0.5    Assessment:   1. 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 She then proceeded to adjuvant RT completed in 08/2019. She started adjuvant Letrozole on 08/23/2019 - present    She appears to remain in complete remission by H&P, labs, and imaging.  A 11/10/2021 bilateral screening mammogram was benign. A 11/10/2021 CXR was NED.  A 02/11/2022 CT CPAP with IV contrast was NED.     2. Osteopenia; resolved on Denosumab    05/04/2022 DEXA was normal.    3. 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 Continue with letrozole 2.5 mg p.o. daily.  2. Continue with denosumab every 6 months, next due on 06/28/2022.  3. RTC 6 months with CBC, CMP, B/L diagnostic mammogram.      A total of 60 minutes was spent on this consultation with >50% spent in education, counseling of patient on diagnosis and treatment and in coordination of care.  All questions presented by patient on this visit were satisfactorily answered.      Percell Miller, MD      CC:  PCP General:  Peri Maris, DO  [address]  [place] [address]      Portions of this note may be dictated using voice recognition software or a dictation service. Variances in spelling and vocabulary are possible and unintentional. Not all errors are caught/corrected. Please notify the [facility] if any discrepancies are noted or if the meaning of any statement is not clear.

## 2022-06-13 IMAGING — MR MRI LUMBAR SPINE WITHOUT CONTRAST
5 of 6 series · 33 of 48 positions shown · non-contrast
Comparison: None available.

﻿EXAM:  [id number]   MRI LUMBAR SPINE WITHOUT CONTRAST
INDICATION: Lumbar spondylolisthesis.
TECHNIQUE: Multiplanar, multi sequence MRI of the lumbar spine without contrast.

[Series 5: T2 · sagittal · 4.0mm · 0.94mm/px · 6 of 13 slices shown (1 of 3)]
[im 1/13]
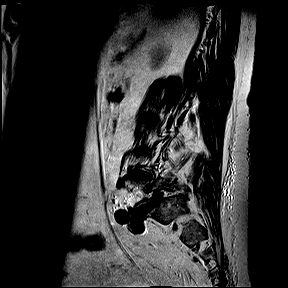
[im 3/13]
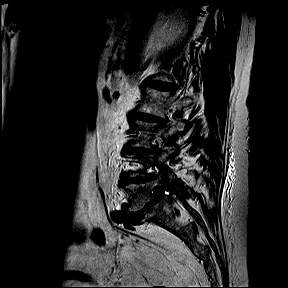
[im 5/13]
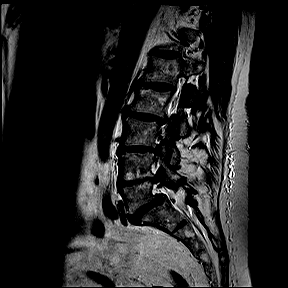
[im 8/13]
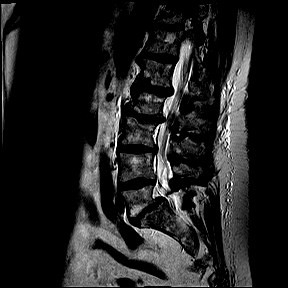
[im 10/13]
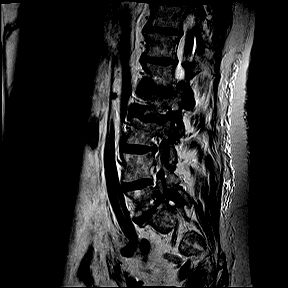
[im 13/13]
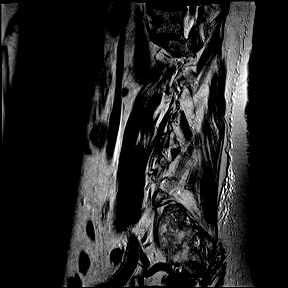

[Series 6: T1 · sagittal · 4.0mm · 0.94mm/px · 6 of 13 slices shown (1 of 2)]
[im 1/13]
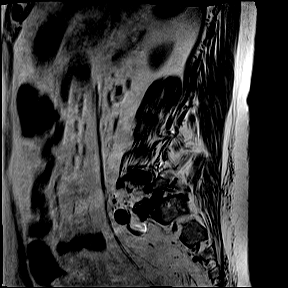
[im 3/13]
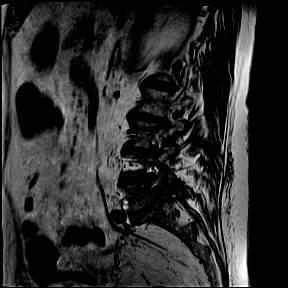
[im 5/13]
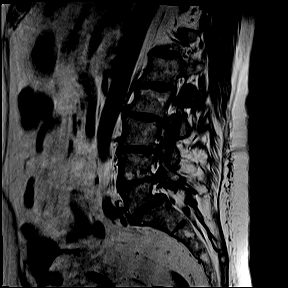
[im 8/13]
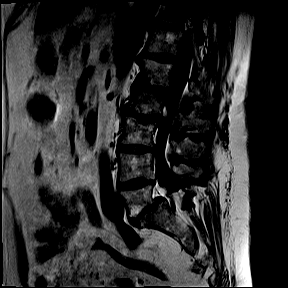
[im 10/13]
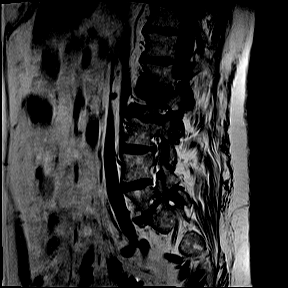
[im 13/13]
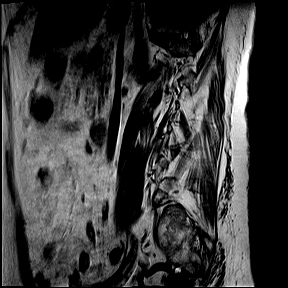

[Series 8: T2 · coronal · 5.0mm · 0.82mm/px · 8 of 18 slices shown (2 of 3)]
[im 1/18]
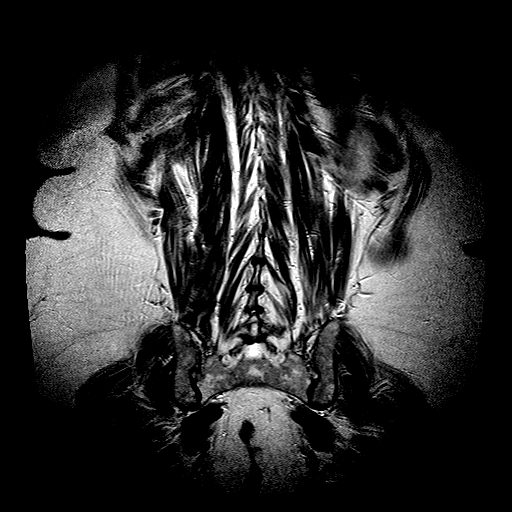
[im 3/18]
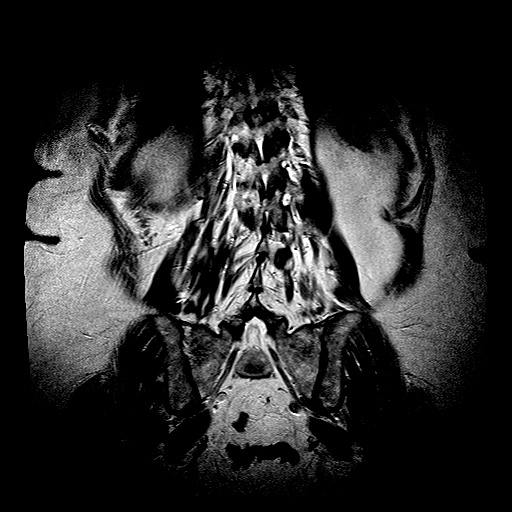
[im 5/18]
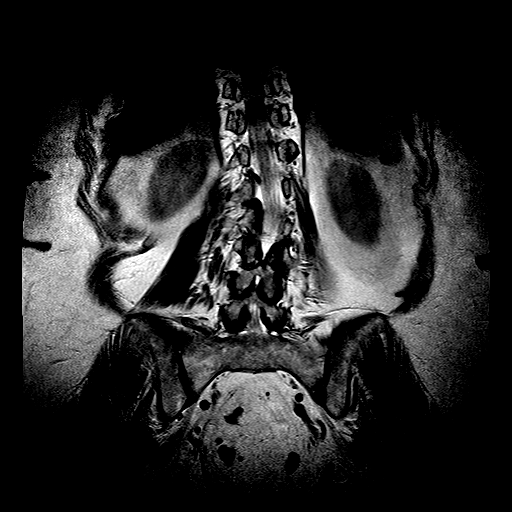
[im 8/18]
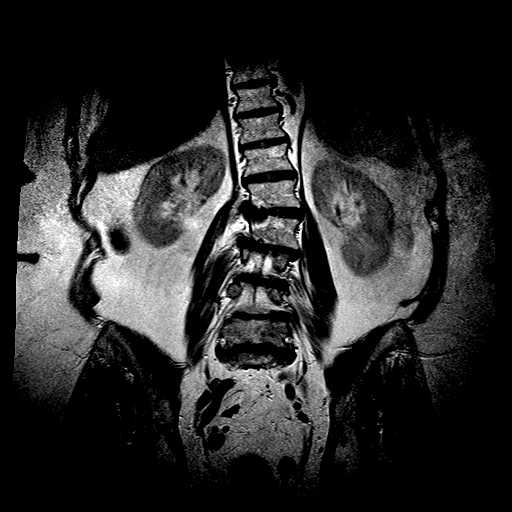
[im 10/18]
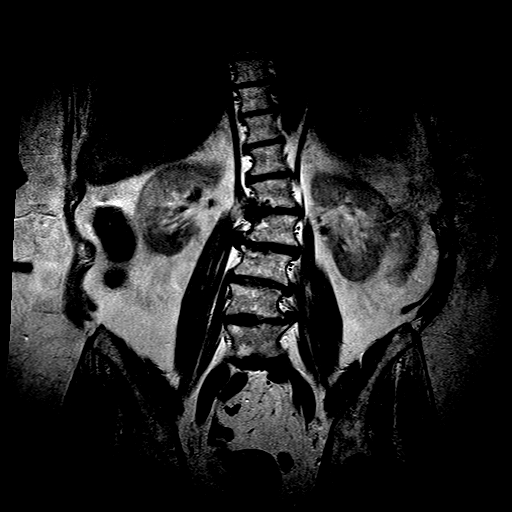
[im 13/18]
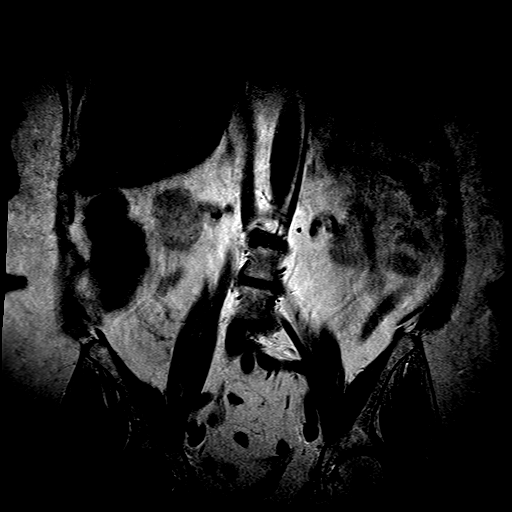
[im 15/18]
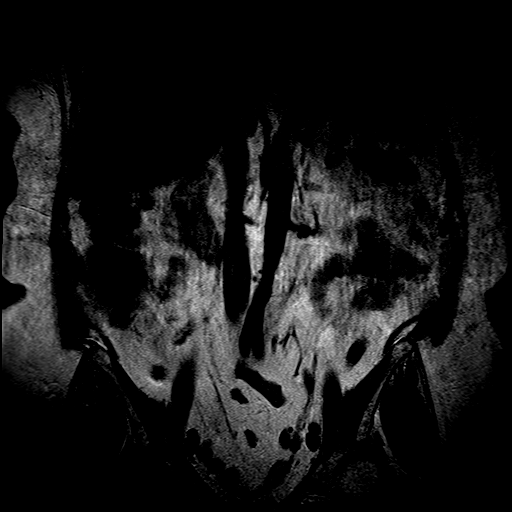
[im 18/18]
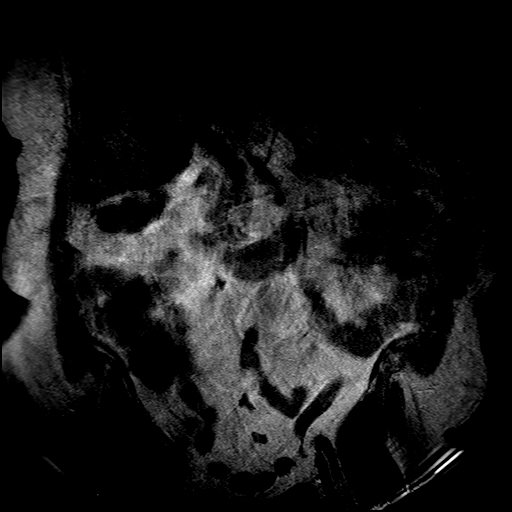

[Series 9: T2 · axial · 4.0mm · 0.52mm/px · z∈[-74,+119]mm · 11 of 26 slices shown (3 of 3)]
[im 1/26]
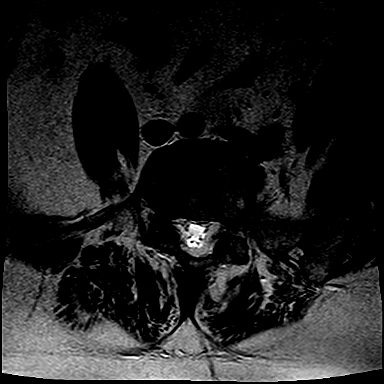
[im 3/26]
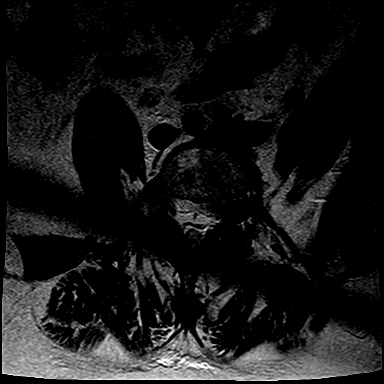
[im 6/26]
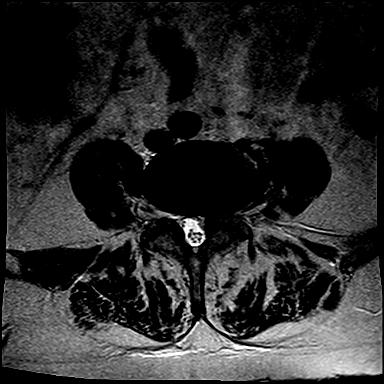
[im 8/26]
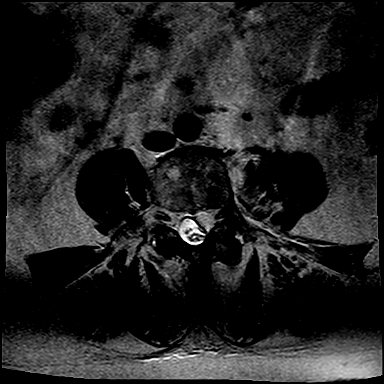
[im 11/26]
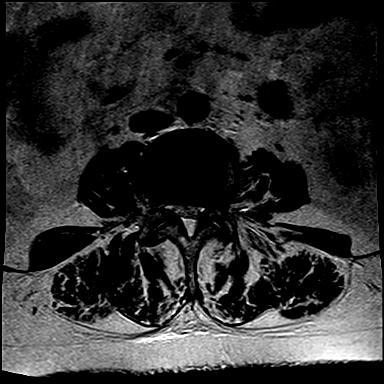
[im 13/26]
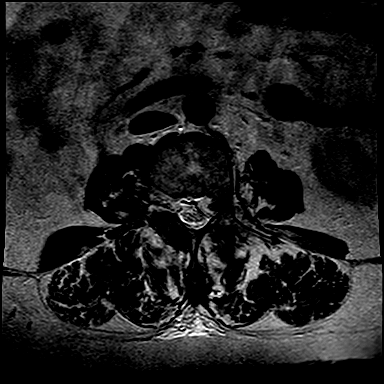
[im 16/26]
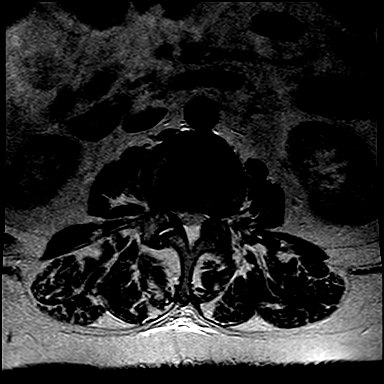
[im 18/26]
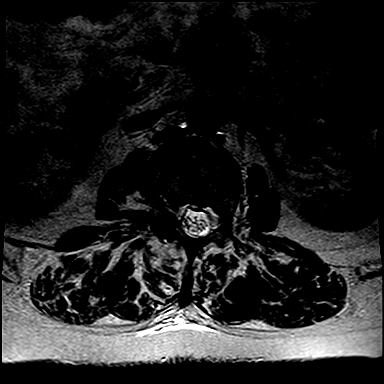
[im 21/26]
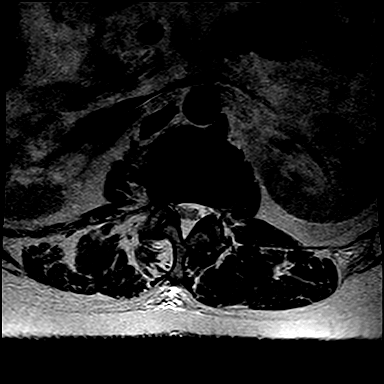
[im 23/26]
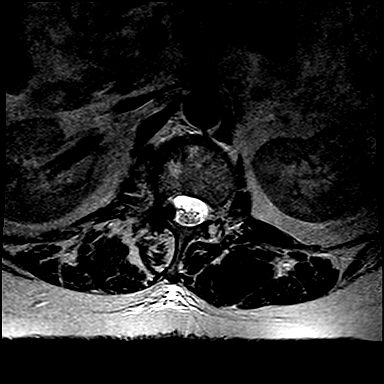
[im 26/26]
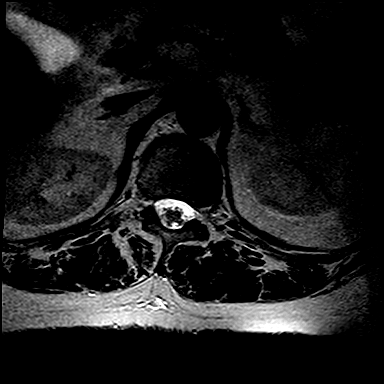

[Series 10: T1 · axial · 4.0mm · 0.52mm/px · z∈[-74,-31]mm · 2 of 26 slices shown (2 of 2)]
[im 1/26]
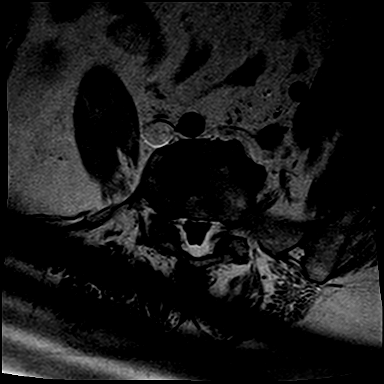
[im 6/26]
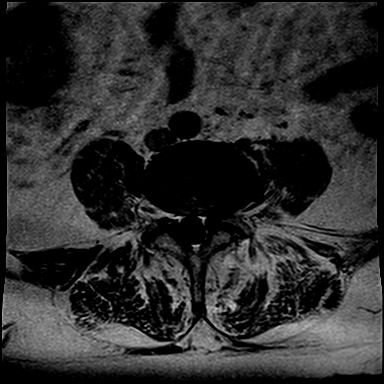

[33 of 48 positions shown; findings below may reference images not displayed]

FINDINGS: There is mild levoconvex scoliosis.  

There are moderately severe degenerative changes at multiple levels with disc space narrowing, disc desiccation, osteophyte formation, and degenerative marrow signal alteration.  

There is Grade I L5-S1 spondylolisthesis.  

T12-L1 is essentially unremarkable.  

At L1-2, there is mild disc bulging and mild spinal stenosis.  

At L2-3, there is mild disc bulging and moderate spinal stenosis.  

At L3-4, there is moderate disc bulging and moderately severe spinal stenosis.  

At L4-5, there is mild disc bulging and mild spinal stenosis.  

At L5-S1, there is mild spinal stenosis.
IMPRESSION: 1. Moderately severe degenerative changes with multi-level spinal stenosis, moderately severe at L3-4.

## 2022-06-13 IMAGING — DX XRAY LUMBAR SPINE MINIMUM 4 VIEWS
1 series · 5 of 5 positions shown · non-contrast
Comparison: None available.

﻿EXAM:  [id number]      XRAY LUMBAR SPINE MINIMUM 4 VIEWS
INDICATION: Low back pain.
TECHNIQUE: Three routine views were obtained in addition to lateral flexion and extension views.

[Series 1: lateral · 0.14mm/px · 5 of 5 slices shown]
[im 1/5]
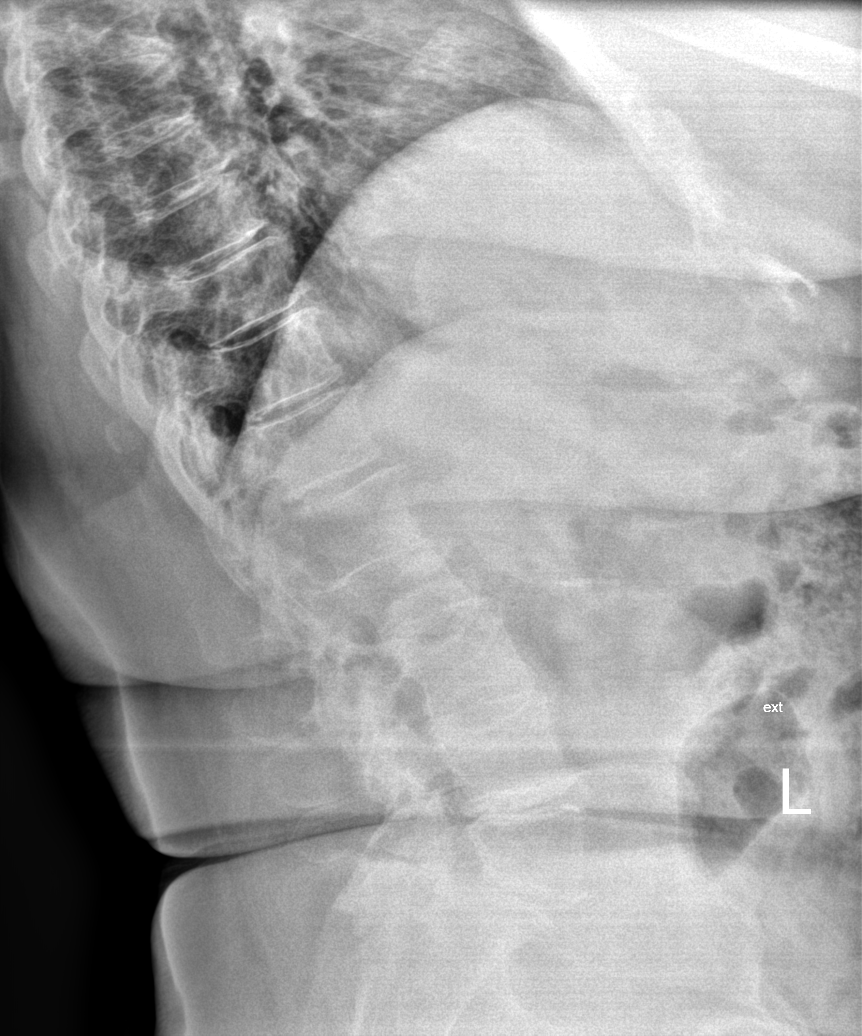
[im 2/5]
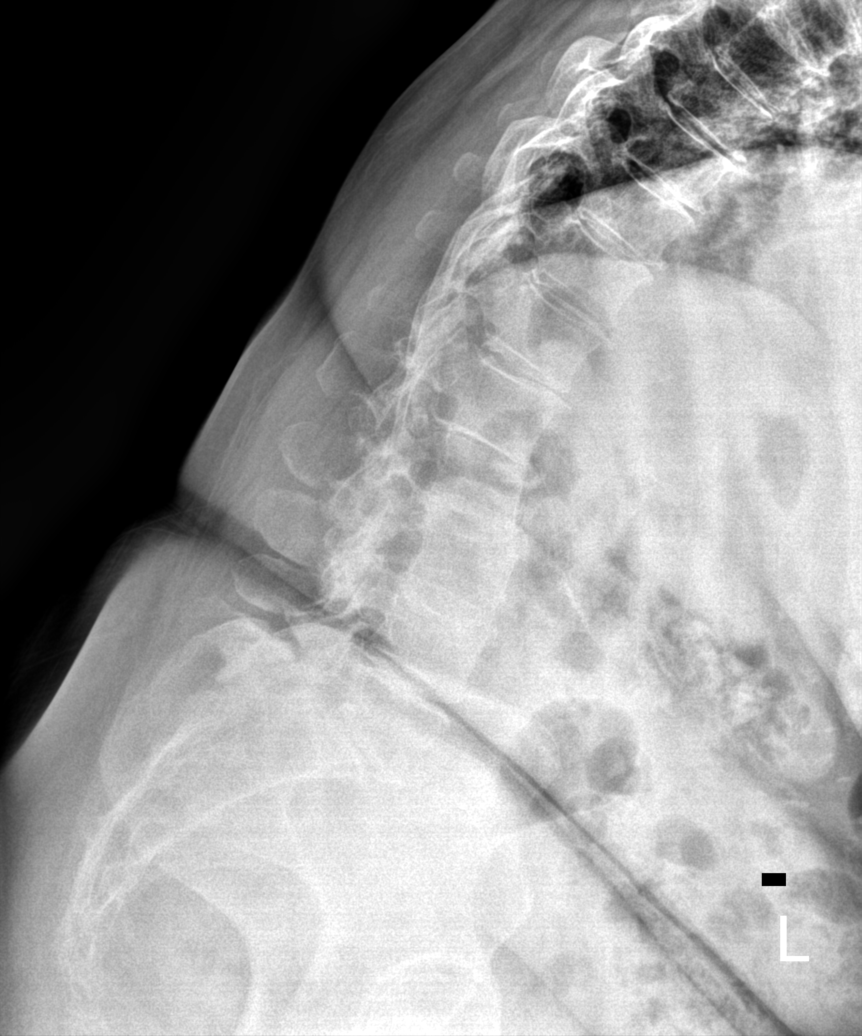
[im 3/5]
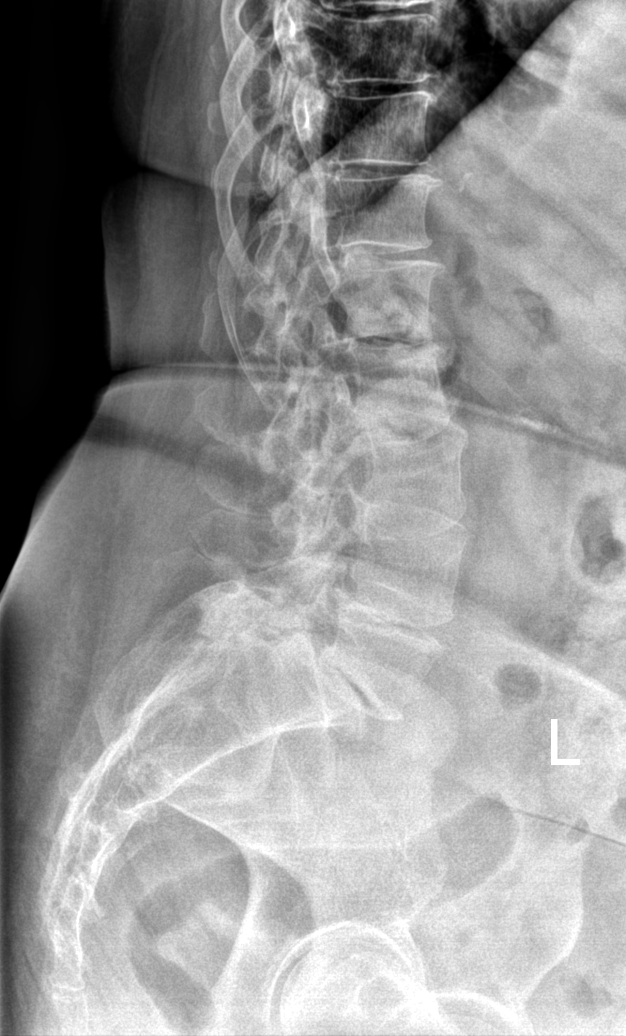
[im 4/5]
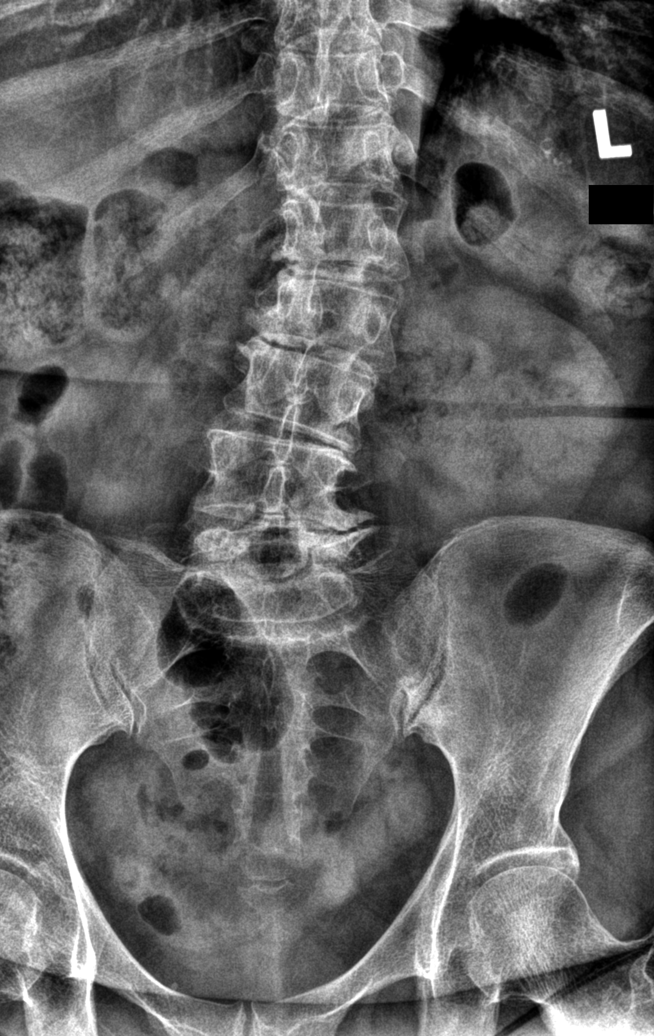
[im 5/5]
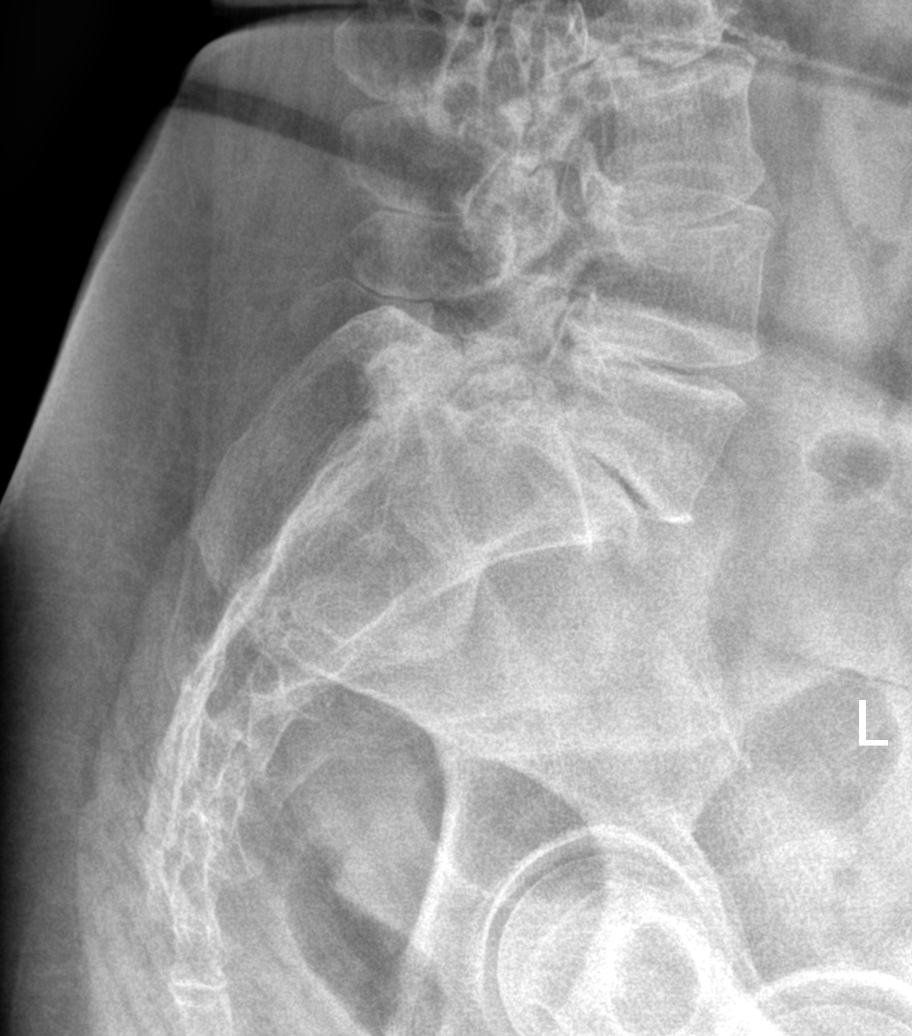

[5 of 5 positions shown; findings below may reference images not displayed]

FINDINGS: There is grade 1 L5-S1 spondylolisthesis.  Alignment is otherwise intact.  No acute fracture is seen.  

There are moderately severe degenerative changes at L4-L5 and L5-S1 with mild to moderate degenerative changes noted elsewhere.  There is mild levoconvex scoliosis that somewhat limits this examination by contributing to suboptimal positioning.
IMPRESSION: 1. Grade 1 L5-S1 spondylolisthesis.

2. Moderately severe degenerative changes.

## 2022-06-15 ENCOUNTER — Ambulatory Visit (INDEPENDENT_AMBULATORY_CARE_PROVIDER_SITE_OTHER): Payer: 59 | Admitting: Surgery

## 2022-06-15 ENCOUNTER — Encounter (INDEPENDENT_AMBULATORY_CARE_PROVIDER_SITE_OTHER): Payer: Self-pay | Admitting: Surgery

## 2022-06-15 ENCOUNTER — Other Ambulatory Visit: Payer: Self-pay

## 2022-06-15 VITALS — BP 149/86 | HR 100 | Temp 97.8°F | Ht 63.0 in | Wt 216.8 lb

## 2022-06-15 DIAGNOSIS — Z853 Personal history of malignant neoplasm of breast: Secondary | ICD-10-CM

## 2022-06-15 NOTE — Progress Notes (Signed)
[facility]    Progress Note    Name: Melanie Snyder MRN:  [id number]   Date: 06/15/2022 Age: 66 y.o.          Date of Birth:  1955/09/12  PCP: Peri Maris, DO  Referring:  Peri Maris     HPI:  Melanie Snyder is a 66 y.o. White female who returns for evaluation of her previously surgically treated right breast cancer.  Performed by me September 2020.  Continues on Femara.      She has no complaints at the current time.  Tolerating her current hormonal therapy without any difficulty.  No palpable mass on her own self-breast exam.  Last mammogram without quality.        Past Medical History:   Diagnosis Date    Breast cancer (CMS [facility]) 01/11/2022    Cardiac arrhythmia     Hyperlipidemia     Hypothyroidism     Malignant neoplasm of right female breast (CMS HCC)     Osteopenia       Past Surgical History:   Procedure Laterality Date    CESAREAN SECTION N/A     HX BREAST BIOPSY Right     HX BREAST REDUCTION Left       Outpatient Medications Marked as Taking for the 06/15/22 encounter (Office Visit) with Shirlee More, MD   Medication Sig    calcium carbonate/vitamin D3 (CALTRATE-600 PLUS VITAMIN D3 ORAL) Caltrate 600 plus D    letrozole (FEMARA) 2.5 mg Oral Tablet Take 1 Tablet (2.5 mg total) by mouth Once a day    levothyroxine (SYNTHROID) 112 mcg Oral Tablet levothyroxine 112 mcg tablet    lidocaine-prilocaine (EMLA) 2.5-2.5 % Cream APPLY CREAM TOPICALLY TO AFFECTED AREA TWICE DAILY AS NEEDED    meloxicam (MOBIC) 15 mg Oral Tablet Take 1 Tablet (15 mg total) by mouth Once a day    pravastatin (PRAVACHOL) 40 mg Oral Tablet Take 1 Tablet (40 mg total) by mouth      Allergies   Allergen Reactions    Amoxicillin            BP (!) 149/86 (Site: Left, Patient Position: Sitting)   Pulse 100   Temp 36.6 C (97.8 F)   Ht 1.6 m ('5\' 3"'$ )   Wt 98.3 kg (216 lb 12.8 oz)   SpO2 95%   BMI 38.40 kg/m          General: appropriate for age. in no acute distress.    Vital  signs are present above and have been reviewed by me     HEENT: Atraumatic, Normocephalic.    Lungs: Nonlabored breathing with symmetric expansion    Breast:  Large overall bilateral breast volume even with her previous surgery/reductions.  No mass or axillary adenopathy.  Previous surgical scars well healed.    Heart:Regular wth respect to rate and rythmn.    Abdomen:Soft. Nontender. Nondistended     Psychiatric: Alert and oriented to person, place, and time. affect appropriate       Assessment/Plan:  Assessment/Plan   1. History of breast cancer         There are no noted signs of recurrent disease on current physical exam or radiographic studies.     Doing quite well.     Follow-up 1 year        This note was partially created using voice recognition software and is inherently subject to errors including those of  syntax and "sound alike " substitutions which may escape proof reading. In such instances, original meaning may be extrapolated by contextual derivation.    Bridgett Larsson MD MBA CPE FACS

## 2022-06-16 ENCOUNTER — Encounter (INDEPENDENT_AMBULATORY_CARE_PROVIDER_SITE_OTHER): Payer: Self-pay | Admitting: Surgery

## 2022-06-17 ENCOUNTER — Other Ambulatory Visit (INDEPENDENT_AMBULATORY_CARE_PROVIDER_SITE_OTHER): Payer: Self-pay | Admitting: Surgery

## 2022-06-17 DIAGNOSIS — Z853 Personal history of malignant neoplasm of breast: Secondary | ICD-10-CM

## 2022-07-04 ENCOUNTER — Encounter (INDEPENDENT_AMBULATORY_CARE_PROVIDER_SITE_OTHER): Payer: Self-pay | Admitting: Surgery

## 2022-10-02 ENCOUNTER — Encounter (INDEPENDENT_AMBULATORY_CARE_PROVIDER_SITE_OTHER): Payer: Self-pay

## 2022-10-19 ENCOUNTER — Ambulatory Visit (HOSPITAL_COMMUNITY): Payer: Self-pay | Admitting: NURSE PRACTITIONER

## 2022-10-19 NOTE — Telephone Encounter (Signed)
-----   Message from Karmen Bongo sent at 10/19/2022  4:23 PM EST -----  Pt sees Dr. Darnell Level and wants to see Dr. Jake Shark instead, can we sched her? Please call.thank you

## 2022-10-19 NOTE — Telephone Encounter (Signed)
Forwarded message to Roseanne Kaufman to reschedule patient.       Patrcia Dolly APRN, FNP-BC, AOCNP, 10/19/2022 , 16:25   '

## 2022-10-20 ENCOUNTER — Telehealth (HOSPITAL_COMMUNITY): Payer: Self-pay | Admitting: HEMATOLOGY-ONCOLOGY

## 2022-11-03 ENCOUNTER — Other Ambulatory Visit: Payer: Self-pay

## 2022-11-03 ENCOUNTER — Ambulatory Visit
Admission: RE | Admit: 2022-11-03 | Discharge: 2022-11-03 | Disposition: A | Discharge status: Home / Self Care | Payer: 59 | Source: Ambulatory Visit | Attending: NURSE PRACTITIONER | Admitting: NURSE PRACTITIONER

## 2022-11-03 VITALS — BP 138/84 | HR 98 | Temp 98.4°F | Resp 18

## 2022-11-03 DIAGNOSIS — M8580 Other specified disorders of bone density and structure, unspecified site: Secondary | ICD-10-CM | POA: Insufficient documentation

## 2022-11-03 DIAGNOSIS — N959 Unspecified menopausal and perimenopausal disorder: Secondary | ICD-10-CM | POA: Insufficient documentation

## 2022-11-03 DIAGNOSIS — C50919 Malignant neoplasm of unspecified site of unspecified female breast: Secondary | ICD-10-CM | POA: Insufficient documentation

## 2022-11-03 MED ORDER — DENOSUMAB 60 MG/ML SUBCUTANEOUS SYRINGE
60.0000 mg | INJECTION | Freq: Once | SUBCUTANEOUS | Status: AC
Start: 2022-11-03 — End: 2022-11-03
  Administered 2022-11-03: 60 mg via SUBCUTANEOUS

## 2022-11-03 MED ORDER — DENOSUMAB 60 MG/ML SUBCUTANEOUS SYRINGE
INJECTION | SUBCUTANEOUS | Status: AC
Start: 2022-11-03 — End: 2022-11-03
  Filled 2022-11-03: qty 1

## 2022-11-03 NOTE — Nurses Notes (Signed)
Patient tolerated Prolia injection without complaint. No s/s of distress noted. Verbalized understanding of patient education.

## 2022-11-16 ENCOUNTER — Inpatient Hospital Stay
Admission: RE | Admit: 2022-11-16 | Discharge: 2022-11-16 | Disposition: A | Discharge status: Home / Self Care | Payer: 59 | Source: Ambulatory Visit | Attending: Surgery | Admitting: Surgery

## 2022-11-16 ENCOUNTER — Other Ambulatory Visit: Payer: Self-pay

## 2022-11-16 ENCOUNTER — Encounter (HOSPITAL_COMMUNITY): Payer: Self-pay

## 2022-11-16 ENCOUNTER — Telehealth (HOSPITAL_COMMUNITY): Payer: Self-pay | Admitting: NURSE PRACTITIONER

## 2022-11-16 DIAGNOSIS — Z853 Personal history of malignant neoplasm of breast: Secondary | ICD-10-CM | POA: Insufficient documentation

## 2022-11-17 ENCOUNTER — Ambulatory Visit (HOSPITAL_COMMUNITY): Payer: Self-pay | Admitting: HEMATOLOGY-ONCOLOGY

## 2022-11-17 ENCOUNTER — Ambulatory Visit: Payer: 59 | Attending: NURSE PRACTITIONER | Admitting: NURSE PRACTITIONER

## 2022-11-17 ENCOUNTER — Encounter (HOSPITAL_COMMUNITY): Payer: Self-pay | Admitting: NURSE PRACTITIONER

## 2022-11-17 VITALS — BP 168/87 | HR 94 | Temp 97.4°F | Ht 63.0 in | Wt 218.4 lb

## 2022-11-17 DIAGNOSIS — Z923 Personal history of irradiation: Secondary | ICD-10-CM | POA: Insufficient documentation

## 2022-11-17 DIAGNOSIS — T451X5A Adverse effect of antineoplastic and immunosuppressive drugs, initial encounter: Secondary | ICD-10-CM | POA: Insufficient documentation

## 2022-11-17 DIAGNOSIS — C50919 Malignant neoplasm of unspecified site of unspecified female breast: Secondary | ICD-10-CM

## 2022-11-17 DIAGNOSIS — Z853 Personal history of malignant neoplasm of breast: Secondary | ICD-10-CM | POA: Insufficient documentation

## 2022-11-17 DIAGNOSIS — Z79811 Long term (current) use of aromatase inhibitors: Secondary | ICD-10-CM | POA: Insufficient documentation

## 2022-11-17 DIAGNOSIS — Z08 Encounter for follow-up examination after completed treatment for malignant neoplasm: Secondary | ICD-10-CM | POA: Insufficient documentation

## 2022-11-17 DIAGNOSIS — Z9011 Acquired absence of right breast and nipple: Secondary | ICD-10-CM | POA: Insufficient documentation

## 2022-11-17 DIAGNOSIS — G620 Drug-induced polyneuropathy: Secondary | ICD-10-CM | POA: Insufficient documentation

## 2022-11-17 DIAGNOSIS — C50911 Malignant neoplasm of unspecified site of right female breast: Secondary | ICD-10-CM

## 2022-11-17 MED ORDER — LETROZOLE 2.5 MG TABLET
2.5000 mg | ORAL_TABLET | Freq: Every day | ORAL | 3 refills | Status: DC
Start: 2022-11-17 — End: 2023-07-11

## 2022-11-17 NOTE — Cancer Center Note (Signed)
HEMATOLOGY/ONCOLOGY, [facility]  [address]  [place] [address]  Operated by [facility]     Progress Note    Name: Melanie Snyder MRN:  [id number]   Date: 11/17/2022 DOB: [birth year]       Referring Physician: Esmond Plants, MD  Primary Care Provider: Mariah Milling, DO    Subjective:  Oncologic follow-up of patient with history of infiltrating ductal carcinoma right breast.     ROS:     Review of Systems   Constitutional: Negative.    HENT:  Negative.     Eyes: Negative.    Respiratory: Negative.     Cardiovascular: Negative.    Gastrointestinal: Negative.    Genitourinary: Negative.     Musculoskeletal: Negative.    Skin: Negative.    Neurological: Negative.    Hematological: Negative.       Objective:   BP (!) 168/87 (Site: Left, Patient Position: Sitting, Cuff Size: Adult)   Pulse 94   Temp 36.3 C (97.4 F) (Thermal Scan)   Ht 1.6 m (5\' 3" )   Wt 99.1 kg (218 lb 6.4 oz)   SpO2 97%   BMI 38.69 kg/m     Physical Exam  Constitutional:       General: She is not in acute distress.     Appearance: Normal appearance.      Comments: Slightly overweight   HENT:      Head: Normocephalic and atraumatic.      Nose: Nose normal.      Mouth/Throat:      Mouth: Mucous membranes are moist.   Eyes:      General: No scleral icterus.  Cardiovascular:      Rate and Rhythm: Normal rate and regular rhythm.      Pulses: Normal pulses.      Heart sounds: Normal heart sounds. No murmur heard.     No friction rub.   Pulmonary:      Effort: Pulmonary effort is normal.      Breath sounds: Normal breath sounds. No wheezing or rhonchi.   Chest:   Breasts:     Right: Inverted nipple and tenderness present. No swelling or skin change.      Left: No inverted nipple, mass, nipple discharge, skin change or tenderness.          Comments: Right and left breast close to symmetrical no palpable masses or discharges bilaterally no adenopathy bilaterally  Abdominal:      General: Bowel sounds are  normal.      Palpations: Abdomen is soft. There is no mass.      Tenderness: There is no abdominal tenderness.      Hernia: No hernia is present.   Musculoskeletal:         General: Normal range of motion.      Cervical back: Normal range of motion and neck supple.      Right lower leg: No edema.      Left lower leg: No edema.   Lymphadenopathy:      Upper Body:      Right upper body: No supraclavicular, axillary or pectoral adenopathy.      Left upper body: No supraclavicular, axillary or pectoral adenopathy.   Skin:     General: Skin is warm.      Capillary Refill: Capillary refill takes 2 to 3 seconds.      Findings: Bruising (bilateral forearms) and erythema present.   Neurological:  General: No focal deficit present.      Mental Status: She is alert and oriented to person, place, and time. Mental status is at baseline.      Gait: Gait normal.   Psychiatric:         Mood and Affect: Mood normal.         Behavior: Behavior normal.         Thought Content: Thought content normal.         Judgment: Judgment normal.        ECOG Status: 0 - Fully active, able to carry on all pre-disease performance without restriction.       Current Outpatient Medications   Medication Sig    calcium carbonate/vitamin D3 (CALTRATE-600 PLUS VITAMIN D3 ORAL) Caltrate 600 plus D    letrozole (FEMARA) 2.5 mg Oral Tablet Take 1 Tablet (2.5 mg total) by mouth Once a day    levothyroxine (SYNTHROID) 112 mcg Oral Tablet levothyroxine 112 mcg tablet    lidocaine-prilocaine (EMLA) 2.5-2.5 % Cream APPLY CREAM TOPICALLY TO AFFECTED AREA TWICE DAILY AS NEEDED    meloxicam (MOBIC) 15 mg Oral Tablet Take 1 Tablet (15 mg total) by mouth Once a day    pravastatin (PRAVACHOL) 40 mg Oral Tablet Take 1 Tablet (40 mg total) by mouth     Allergies as of 11/17/2022 - Reviewed 11/17/2022   Allergen Reaction Noted    Amoxicillin  09/14/2020     Labs:  CBC  Diff   Lab Results   Component Value Date/Time    WBC 6.9 05/28/2022 04:04 PM    HGB 14.2 05/28/2022  04:04 PM    HCT 40.6 05/28/2022 04:04 PM    PLTCNT 191 05/28/2022 04:04 PM    RBC 4.05 05/28/2022 04:04 PM    MCV 100.3 (H) 05/28/2022 04:04 PM    MCHC 34.9 05/28/2022 04:04 PM    MCH 35.0 (H) 05/28/2022 04:04 PM    RDW 13.3 05/28/2022 04:04 PM    MPV 9.1 05/28/2022 04:04 PM    Lab Results   Component Value Date/Time    PMNS 73 05/28/2022 04:04 PM    LYMPHOCYTES 17 05/28/2022 04:04 PM    EOSINOPHIL 2 05/28/2022 04:04 PM    MONOCYTES 8 05/28/2022 04:04 PM    BASOPHILS 0 05/28/2022 04:04 PM    BASOPHILS 0.00 05/28/2022 04:04 PM    PMNABS 5.00 05/28/2022 04:04 PM    LYMPHSABS 1.20 05/28/2022 04:04 PM    EOSABS 0.20 05/28/2022 04:04 PM    MONOSABS 0.60 05/28/2022 04:04 PM            Comprehensive Metabolic Profile    Lab Results   Component Value Date    SODIUM 139 05/28/2022    POTASSIUM 3.5 05/28/2022    CHLORIDE 105 05/28/2022    CO2 25 05/28/2022    ANIONGAP 9 05/28/2022    BUN 12 05/28/2022    CREATININE 0.74 05/28/2022    ALBUMIN 4.4 05/28/2022    CALCIUM 9.1 05/28/2022    GLUCOSENF 87 05/28/2022    ALKPHOS 80 05/28/2022    ALT 20 05/28/2022    AST 26 05/28/2022    TOTBILIRUBIN 0.6 05/28/2022    TOTALPROTEIN 7.0 05/28/2022          CA 27.29   Date Value Ref Range Status   05/28/2022 15 <38 U/mL Final     Comment:        This test was performed using the Siemens chemilumi-  nescent method. Values  obtained from different assay  methods cannot be used interchangeably. CA27.29  levels, regardless of value, should not be interpreted  as absolute evidence of the presence or absence of  disease.            Radiology:     Mammogram 11/16/2022  Interval development of a group of calcifications with some linear features in the left retro areolar breast at about 12:00, about 2.7 cm deep to the nipple.   RECOMMENDATION: Left CC and ML 2-D spot mag views of the left retro areolar 12:00 calcifications of concern. (Ordered per Dr. Vinie Sill )       Bone densitometry study 05/04/2022 Normal.   Scan from 04/2020 showed osteopenia of  the lumbar spine, the proximal left femur, the right femoral neck with T-score lumbar spine of -1, proximal left femur of -1, right proximal femur of -0.5    Assessment:   1. 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 She then proceeded to adjuvant RT completed in 08/2019. She started adjuvant Letrozole on 08/23/2019 - present. Patient would like to extend her therapy for 7-10 years.      2. Osteopenia; resolved on Denosumab, we will continue Q 6 months next appointment for October of 2023    05/04/2022 DEXA was normal.    3. 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 Continue with letrozole 2.5 mg p.o. daily.  2. Continue with Denosumab every 6 months, next due on 06/28/2022.  3. RTC 6 months with CBC, CMP, CA27-29 , B/L diagnostic mammogram. Due in 11/2023  4. Bone Density- due after 05/04/2024    The patient was given the opportunity to ask questions, and these were answered to their satisfaction. They are welcome to call with any questions or concerns at any point.      On the day of the encounter, I spent a total of  36 minutes on this patient encounter including review of historical information, examination, documentation and post-visit activities.       Evie Lacks APRN, FNP-C 4/4/202411:29    CC:  PCP General:  Mariah Milling, DO  [address]  [place] [address]      Portions of this note may be dictated using voice recognition software or a dictation service. Variances in spelling and vocabulary are possible and unintentional. Not all errors are caught/corrected. Please notify the [facility] if any discrepancies are noted or  if the meaning of any statement is not clear.

## 2022-11-22 ENCOUNTER — Other Ambulatory Visit: Payer: Self-pay

## 2022-11-22 ENCOUNTER — Inpatient Hospital Stay
Admission: RE | Admit: 2022-11-22 | Discharge: 2022-11-22 | Disposition: A | Discharge status: Home / Self Care | Payer: 59 | Source: Ambulatory Visit | Attending: Surgery | Admitting: Surgery

## 2022-11-22 DIAGNOSIS — Z9889 Other specified postprocedural states: Secondary | ICD-10-CM | POA: Insufficient documentation

## 2022-11-22 DIAGNOSIS — Z853 Personal history of malignant neoplasm of breast: Secondary | ICD-10-CM | POA: Insufficient documentation

## 2022-11-23 ENCOUNTER — Encounter (INDEPENDENT_AMBULATORY_CARE_PROVIDER_SITE_OTHER): Payer: Self-pay | Admitting: Surgery

## 2022-11-23 ENCOUNTER — Encounter (INDEPENDENT_AMBULATORY_CARE_PROVIDER_SITE_OTHER): Payer: Self-pay | Admitting: NURSE PRACTITIONER

## 2022-11-25 ENCOUNTER — Ambulatory Visit (HOSPITAL_COMMUNITY): Payer: Self-pay

## 2022-12-04 NOTE — H&P (Unsigned)
GENERAL SURGERY, [facility] GENERAL SURGERY  [address]  [place] [address]    History and Physical    Name: Melanie Snyder MRN:  [id number]   Date: 12/05/2022 DOB:  Sep 27, 1955 (66 y.o.)                  Reason for Visit: No chief complaint on file.    History of Present Illness  Melanie Snyder presents today for evaluation of recent mammographic findings.  She underwent a mammogram which showed increasing calcifications left breast with recommendation for biopsy.  BI-RADS category 4 categorization.     Patient previously underwent a right lumpectomy September 2020 secondary to infiltrating ductal carcinoma ER PR positive.        MEDICAL DECISION:  Review of the result(s) of each unique test:  Patient underwent diagnostic testing ( breast imaging studies) prior to this dates visit.  I have personally reviewed the results and that serves as a component of the medical decision making for this encounter       Review of prior external note(s) from each unique source:  Patients referral to this office including a recent assessment by the referring provider.  This was reviewed by me for this unique office visit for the indication and intent of the referral as well as any pertinent medical or surgical history relevant to the patients independent evaluation by me today.        Patient Data  Patient History  Past Medical History:   Diagnosis Date    Breast cancer (CMS [facility]) 01/11/2022    Cardiac arrhythmia     Hyperlipidemia     Hypothyroidism     Malignant neoplasm of right female breast (CMS HCC)     Osteopenia          Past Surgical History:   Procedure Laterality Date    CESAREAN SECTION N/A     HX BREAST BIOPSY Right     6 months of chemo and radiation after that    HX BREAST REDUCTION Left          Current Outpatient Medications   Medication Sig    calcium carbonate/vitamin D3 (CALTRATE-600 PLUS VITAMIN D3 ORAL) Caltrate 600 plus D    letrozole (FEMARA) 2.5 mg Oral Tablet Take 1 Tablet (2.5 mg  total) by mouth Once a day    levothyroxine (SYNTHROID) 112 mcg Oral Tablet levothyroxine 112 mcg tablet    lidocaine-prilocaine (EMLA) 2.5-2.5 % Cream APPLY CREAM TOPICALLY TO AFFECTED AREA TWICE DAILY AS NEEDED    meloxicam (MOBIC) 15 mg Oral Tablet Take 1 Tablet (15 mg total) by mouth Once a day    pravastatin (PRAVACHOL) 40 mg Oral Tablet Take 1 Tablet (40 mg total) by mouth     Allergies   Allergen Reactions    Amoxicillin      Other Reaction(s): Not available     Family Medical History:       Problem Relation (Age of Onset)    Cancer Mother, Maternal Grandfather    Coronary Artery Disease Paternal Aunt, Paternal Uncle    No Known Problems Father, Sister, Brother, Maternal Grandmother, Paternal Grandmother, Paternal Grandfather, Daughter, Son, Maternal Aunt, Maternal Uncle, Other            Social History     Tobacco Use    Smoking status: Never    Smokeless tobacco: Never   Vaping Use    Vaping status: Never Used   Substance Use Topics  Alcohol use: Never    Drug use: Never            Physical Examination:  There were no vitals filed for this visit.   General: appropriate for age. in no acute distress.    Vital signs are present above and have been reviewed by me     HEENT: Atraumatic, Normocephalic.    Lungs: Nonlabored breathing with symmetric expansion    Heart:Regular wth respect to rate and rythmn.    Abdomen:Soft. Nontender. Nondistended     Psychiatric: Alert and oriented to person, place, and time. affect appropriate      Assessment and Plan    ICD-10-CM    1. Abnormal mammogram of left breast  R92.8             ***          I appreciate the opportunity to be involved in the care of your patients.  If you have any questions or concerns regarding this encounter, please do not hesitate to contact me at your convenience.      Maura Crandall MD MBA CPE FACS     This note may have been partially generated using MModal Fluency Direct system, and there may be some incorrect words, spellings, and  punctuation that were not noted in checking the note before saving, though effort was made to avoid such errors.

## 2022-12-05 ENCOUNTER — Other Ambulatory Visit: Payer: Self-pay

## 2022-12-05 ENCOUNTER — Encounter (INDEPENDENT_AMBULATORY_CARE_PROVIDER_SITE_OTHER): Payer: Self-pay | Admitting: Surgery

## 2022-12-05 ENCOUNTER — Other Ambulatory Visit (INDEPENDENT_AMBULATORY_CARE_PROVIDER_SITE_OTHER): Payer: Self-pay | Admitting: Surgery

## 2022-12-05 ENCOUNTER — Inpatient Hospital Stay (INDEPENDENT_AMBULATORY_CARE_PROVIDER_SITE_OTHER)
Admission: RE | Admit: 2022-12-05 | Discharge: 2022-12-05 | Disposition: A | Discharge status: Home / Self Care | Payer: 59 | Source: Ambulatory Visit

## 2022-12-05 ENCOUNTER — Ambulatory Visit (INDEPENDENT_AMBULATORY_CARE_PROVIDER_SITE_OTHER): Payer: 59 | Admitting: Surgery

## 2022-12-05 VITALS — BP 177/119 | HR 115 | Temp 97.2°F | Resp 18 | Ht 63.0 in | Wt 216.0 lb

## 2022-12-05 DIAGNOSIS — N6320 Unspecified lump in the left breast, unspecified quadrant: Secondary | ICD-10-CM

## 2022-12-05 DIAGNOSIS — R928 Other abnormal and inconclusive findings on diagnostic imaging of breast: Secondary | ICD-10-CM

## 2022-12-05 DIAGNOSIS — Z86000 Personal history of in-situ neoplasm of breast: Secondary | ICD-10-CM

## 2022-12-07 ENCOUNTER — Other Ambulatory Visit (INDEPENDENT_AMBULATORY_CARE_PROVIDER_SITE_OTHER): Payer: Self-pay | Admitting: Surgery

## 2022-12-07 DIAGNOSIS — R928 Other abnormal and inconclusive findings on diagnostic imaging of breast: Secondary | ICD-10-CM

## 2022-12-13 ENCOUNTER — Other Ambulatory Visit: Payer: Self-pay

## 2022-12-13 ENCOUNTER — Inpatient Hospital Stay
Admission: RE | Admit: 2022-12-13 | Discharge: 2022-12-13 | Disposition: A | Discharge status: Home / Self Care | Payer: 59 | Source: Ambulatory Visit | Attending: Surgery | Admitting: Surgery

## 2022-12-13 DIAGNOSIS — Z853 Personal history of malignant neoplasm of breast: Secondary | ICD-10-CM | POA: Insufficient documentation

## 2022-12-13 DIAGNOSIS — R928 Other abnormal and inconclusive findings on diagnostic imaging of breast: Secondary | ICD-10-CM | POA: Insufficient documentation

## 2022-12-13 DIAGNOSIS — N641 Fat necrosis of breast: Secondary | ICD-10-CM | POA: Insufficient documentation

## 2022-12-13 DIAGNOSIS — R920 Mammographic microcalcification found on diagnostic imaging of breast: Secondary | ICD-10-CM | POA: Insufficient documentation

## 2022-12-13 DIAGNOSIS — M6028 Foreign body granuloma of soft tissue, not elsewhere classified, other site: Secondary | ICD-10-CM | POA: Insufficient documentation

## 2022-12-13 DIAGNOSIS — N6032 Fibrosclerosis of left breast: Secondary | ICD-10-CM | POA: Insufficient documentation

## 2022-12-13 MED ORDER — LIDOCAINE 1 %-EPINEPHRINE 1:100,000 INJECTION SOLUTION
INTRAMUSCULAR | Status: AC
Start: 2022-12-13 — End: 2022-12-13
  Filled 2022-12-13: qty 20

## 2022-12-13 NOTE — OR Surgeon (Signed)
[facility]    Patient Name: Melanie Snyder, Melanie Snyder Number: [id number]  Date of Service: 12/13/2022   Date of Birth: 02/25/1956      Pre-Operative Diagnosis:  Abnormal left mammogram with a history of breast cancer    Post-Operative Diagnosis:  Same    Procedure(s)/Description:    Mammotome breast biopsy    Attending Surgeon: Esmond Plants, MD     Anesthesia Type: LOCAL    Estimated Blood Loss:  <5 cc    Specimens Removed: Stereotactic specimens    Patient was taken to the stereotactic suite.  After standardized timeout procedure was undertaken and we personally verified the breast that was to undergo the procedure, she was positioned on the table and subsequent scalp view was taken from the CC approach.  The area of concern was identified.  Subsequently stereotactic images were taken of the breast that had been preoperatively identified as the area of concern.  After appropriate localization of the abnormality, stereotactic images were taken confirming appropriate depth for the planned biopsy.  The breast was subsequently prepped with DuraPrep and anesthetized using 2% lidocaine without epinephrine.  Small skin nick was made, and the stereotactic probe was advanced to the prefire position.  Stereotactic images confirmed its location.  Subsequently the probe was advanced through the lesion.  Again, stereotactic images confirmed appropriate position of the biopsy needle.  12 sequential biopsies were taken corresponding to the positions on the clock face.  These were collected within the collection probe.  Subsequently the Kubtec was used to image the specimen confirming appropriate acquisition of the targeted lesion.  The marker was placed in the biopsy cavity.  Images show appropriate biopsy cavity with absence of some of the targeted microcalcifications and marker within the cavity.  Probe was removed.  Hemostasis was assured.  Steri-Strips are applied over the skin neck.  Tolerated this  without any difficulty.    Maura Crandall MD MBA CPE FACS

## 2022-12-14 DIAGNOSIS — N6032 Fibrosclerosis of left breast: Secondary | ICD-10-CM

## 2022-12-14 DIAGNOSIS — R920 Mammographic microcalcification found on diagnostic imaging of breast: Secondary | ICD-10-CM

## 2022-12-14 LAB — SURGICAL PATHOLOGY SPECIMEN: Clinical History: 64

## 2022-12-15 ENCOUNTER — Ambulatory Visit (HOSPITAL_COMMUNITY): Payer: Self-pay | Admitting: Radiology

## 2023-01-03 ENCOUNTER — Other Ambulatory Visit (HOSPITAL_COMMUNITY): Payer: Self-pay | Admitting: RADIATION ONCOLOGY

## 2023-01-03 DIAGNOSIS — C50519 Malignant neoplasm of lower-outer quadrant of unspecified female breast: Secondary | ICD-10-CM

## 2023-01-12 ENCOUNTER — Ambulatory Visit (HOSPITAL_COMMUNITY): Payer: Self-pay

## 2023-01-13 ENCOUNTER — Inpatient Hospital Stay
Admission: RE | Admit: 2023-01-13 | Discharge: 2023-01-13 | Disposition: A | Discharge status: Home / Self Care | Payer: 59 | Source: Ambulatory Visit | Attending: RADIATION ONCOLOGY | Admitting: RADIATION ONCOLOGY

## 2023-01-13 ENCOUNTER — Ambulatory Visit (HOSPITAL_COMMUNITY): Payer: Self-pay

## 2023-01-13 ENCOUNTER — Other Ambulatory Visit (HOSPITAL_COMMUNITY): Payer: 59

## 2023-01-13 ENCOUNTER — Other Ambulatory Visit: Payer: Self-pay

## 2023-01-13 DIAGNOSIS — C50519 Malignant neoplasm of lower-outer quadrant of unspecified female breast: Secondary | ICD-10-CM | POA: Insufficient documentation

## 2023-01-13 LAB — CREATININE WITH EGFR
CREATININE: 0.73 mg/dL (ref 0.60–1.30)
ESTIMATED GFR: 92 mL/min/{1.73_m2} (ref >59)

## 2023-01-13 MED ORDER — BARIUM SULFATE 2 % (W/V) ORAL SUSPENSION
450.0000 mL | ORAL | Status: AC
Start: 2023-01-13 — End: 2023-01-13
  Administered 2023-01-13: 450 mL via ORAL

## 2023-01-13 MED ORDER — IOHEXOL 350 MG IODINE/ML INTRAVENOUS SOLUTION
100.0000 mL | INTRAVENOUS | Status: AC
Start: 2023-01-13 — End: 2023-01-13
  Administered 2023-01-13: 75 mL via INTRAVENOUS

## 2023-04-11 ENCOUNTER — Encounter (HOSPITAL_COMMUNITY): Payer: Self-pay

## 2023-04-18 ENCOUNTER — Other Ambulatory Visit (HOSPITAL_COMMUNITY): Payer: Self-pay | Admitting: PHYSICIAN ASSISTANT

## 2023-04-20 ENCOUNTER — Other Ambulatory Visit (HOSPITAL_COMMUNITY): Payer: Self-pay | Admitting: PHYSICIAN ASSISTANT

## 2023-05-11 ENCOUNTER — Other Ambulatory Visit: Payer: Self-pay

## 2023-05-11 ENCOUNTER — Ambulatory Visit: Payer: 59 | Attending: NURSE PRACTITIONER

## 2023-05-11 DIAGNOSIS — C50919 Malignant neoplasm of unspecified site of unspecified female breast: Secondary | ICD-10-CM | POA: Insufficient documentation

## 2023-05-11 LAB — COMPREHENSIVE METABOLIC PANEL, NON-FASTING
ALBUMIN/GLOBULIN RATIO: 1.7 — ABNORMAL HIGH (ref 0.8–1.4)
ALBUMIN: 4.3 g/dL (ref 3.5–5.7)
ALKALINE PHOSPHATASE: 67 U/L (ref 34–104)
ALT (SGPT): 15 U/L (ref 7–52)
ANION GAP: 8 mmol/L (ref 4–13)
AST (SGOT): 24 U/L (ref 13–39)
BILIRUBIN TOTAL: 0.6 mg/dL (ref 0.3–1.0)
BUN/CREA RATIO: 15 (ref 6–22)
BUN: 13 mg/dL (ref 7–25)
CALCIUM, CORRECTED: 9.6 mg/dL (ref 8.9–10.8)
CALCIUM: 9.8 mg/dL (ref 8.6–10.3)
CHLORIDE: 103 mmol/L (ref 98–107)
CO2 TOTAL: 28 mmol/L (ref 21–31)
CREATININE: 0.87 mg/dL (ref 0.60–1.30)
ESTIMATED GFR: 74 mL/min/{1.73_m2} (ref >59)
GLOBULIN: 2.5 — ABNORMAL LOW (ref 2.9–5.4)
GLUCOSE: 84 mg/dL (ref 74–109)
OSMOLALITY, CALCULATED: 277 mosm/kg (ref 270–290)
POTASSIUM: 4.1 mmol/L (ref 3.5–5.1)
PROTEIN TOTAL: 6.8 g/dL (ref 6.4–8.9)
SODIUM: 139 mmol/L (ref 136–145)

## 2023-05-11 LAB — CBC WITH DIFF
BASOPHIL #: 0 10*3/uL (ref 0.00–0.10)
BASOPHIL %: 0 % (ref 0–1)
EOSINOPHIL #: 0.1 10*3/uL (ref 0.00–0.50)
EOSINOPHIL %: 1 % (ref 1–7)
HCT: 42.3 % — ABNORMAL HIGH (ref 31.2–41.9)
HGB: 14.5 g/dL — ABNORMAL HIGH (ref 10.9–14.3)
LYMPHOCYTE #: 1.4 10*3/uL (ref 1.00–3.00)
LYMPHOCYTE %: 23 % (ref 16–44)
MCH: 34.9 pg — ABNORMAL HIGH (ref 24.7–32.8)
MCHC: 34.2 g/dL (ref 32.3–35.6)
MCV: 102.2 fL — ABNORMAL HIGH (ref 75.5–95.3)
MONOCYTE #: 0.6 10*3/uL (ref 0.30–1.00)
MONOCYTE %: 11 % (ref 5–13)
MPV: 9.2 fL (ref 7.9–10.8)
NEUTROPHIL #: 3.9 10*3/uL (ref 1.85–7.80)
NEUTROPHIL %: 65 % (ref 43–77)
PLATELETS: 206 10*3/uL (ref 140–440)
RBC: 4.14 10*6/uL (ref 3.63–4.92)
RDW: 13.2 % (ref 12.3–17.7)
WBC: 6.1 10*3/uL (ref 3.8–11.8)

## 2023-05-16 ENCOUNTER — Telehealth (INDEPENDENT_AMBULATORY_CARE_PROVIDER_SITE_OTHER): Payer: Self-pay | Admitting: NURSE PRACTITIONER

## 2023-05-16 ENCOUNTER — Encounter (HOSPITAL_COMMUNITY): Payer: Self-pay | Admitting: NURSE PRACTITIONER

## 2023-05-16 LAB — CA 27,29: CA 27.29: 11 U/mL (ref <38)

## 2023-05-16 NOTE — Telephone Encounter (Signed)
Spoke to pt confirmed r/s appt 07-11-23 @ 3:30 due to provider being out.

## 2023-05-17 ENCOUNTER — Encounter (INDEPENDENT_AMBULATORY_CARE_PROVIDER_SITE_OTHER): Payer: Self-pay | Admitting: NURSE PRACTITIONER

## 2023-05-19 ENCOUNTER — Other Ambulatory Visit (INDEPENDENT_AMBULATORY_CARE_PROVIDER_SITE_OTHER): Payer: Self-pay

## 2023-05-19 ENCOUNTER — Ambulatory Visit (INDEPENDENT_AMBULATORY_CARE_PROVIDER_SITE_OTHER): Payer: Self-pay | Admitting: NURSE PRACTITIONER

## 2023-05-23 ENCOUNTER — Encounter (HOSPITAL_COMMUNITY): Payer: Self-pay | Admitting: NURSE PRACTITIONER

## 2023-05-29 ENCOUNTER — Encounter (HOSPITAL_COMMUNITY): Payer: Self-pay | Admitting: NURSE PRACTITIONER

## 2023-05-30 ENCOUNTER — Other Ambulatory Visit: Payer: Self-pay

## 2023-05-30 ENCOUNTER — Ambulatory Visit
Admission: RE | Admit: 2023-05-30 | Discharge: 2023-05-30 | Disposition: A | Discharge status: Home / Self Care | Payer: 59 | Source: Ambulatory Visit | Attending: Family Medicine | Admitting: Family Medicine

## 2023-05-30 VITALS — BP 133/83 | HR 97 | Temp 97.7°F | Resp 18

## 2023-05-30 DIAGNOSIS — M8580 Other specified disorders of bone density and structure, unspecified site: Secondary | ICD-10-CM | POA: Insufficient documentation

## 2023-05-30 DIAGNOSIS — N959 Unspecified menopausal and perimenopausal disorder: Secondary | ICD-10-CM | POA: Insufficient documentation

## 2023-05-30 DIAGNOSIS — C50919 Malignant neoplasm of unspecified site of unspecified female breast: Secondary | ICD-10-CM | POA: Insufficient documentation

## 2023-05-30 DIAGNOSIS — Z5112 Encounter for antineoplastic immunotherapy: Secondary | ICD-10-CM | POA: Insufficient documentation

## 2023-05-30 DIAGNOSIS — Z79620 Long term (current) use of immunosuppressive biologic: Secondary | ICD-10-CM | POA: Insufficient documentation

## 2023-05-30 MED ORDER — DENOSUMAB 60 MG/ML SUBCUTANEOUS SYRINGE
60.0000 mg | INJECTION | Freq: Once | SUBCUTANEOUS | Status: AC
Start: 2023-05-30 — End: 2023-05-30
  Administered 2023-05-30: 60 mg via SUBCUTANEOUS

## 2023-05-30 MED ORDER — DENOSUMAB 60 MG/ML SUBCUTANEOUS SYRINGE
INJECTION | SUBCUTANEOUS | Status: AC
Start: 2023-05-30 — End: 2023-05-30
  Filled 2023-05-30: qty 1

## 2023-05-30 NOTE — Nurses Notes (Signed)
1511-1532-Arrived to OP ONC ambulatory.  Here for Prolia injection.  No voiced complaints offered.  VSS.  Prolia 60 mg administered, sub-q, in upper left arm, per pt request.  Tolerated well. Adhesive bandage applied.  Left OP ONC ambulatory with no s/s of acute distress.  Junie Panning, RN

## 2023-06-19 ENCOUNTER — Encounter (INDEPENDENT_AMBULATORY_CARE_PROVIDER_SITE_OTHER): Payer: Self-pay | Admitting: Surgery

## 2023-07-01 NOTE — Progress Notes (Unsigned)
GENERAL SURGERY, [facility] GENERAL SURGERY  [address]  [place] [address]    Progress Note    Name: CHAMPAINE AMSTER MRN:  [id number]   Date: 07/03/2023 DOB:  1956/02/27 (67 y.o.)              Date of Birth:  1955-10-24  PCP: Mariah Milling, DO  Referring:  No ref. provider found     HPI:  Melanie Snyder is a 67 y.o. White female who returns for scheduled follow-up of her previously surgically treated right breast cancer.  Performed in September 2020.  ER PR positive.  Has had 1 negative biopsy on the left since that time.  Continues to take Femara without difficulty.  Is due for her mammogram.  Has noted no abnormality on her own self-breast exam.  Has no other complaints.        Past Medical History:   Diagnosis Date    Breast cancer (CMS [facility]) 01/11/2022    Cardiac arrhythmia     Hyperlipidemia     Hypothyroidism     Malignant neoplasm of right female breast (CMS HCC)     Osteopenia       Past Surgical History:   Procedure Laterality Date    CESAREAN SECTION N/A     HX BREAST BIOPSY Right     6 months of chemo and radiation after that    HX BREAST REDUCTION Left       Outpatient Medications Marked as Taking for the 07/03/23 encounter (Office Visit) with Esmond Plants, MD   Medication Sig    calcium carbonate/vitamin D3 (CALTRATE-600 PLUS VITAMIN D3 ORAL) Caltrate 600 plus D    letrozole (FEMARA) 2.5 mg Oral Tablet Take 1 Tablet (2.5 mg total) by mouth Once a day    levothyroxine (SYNTHROID) 112 mcg Oral Tablet levothyroxine 112 mcg tablet    lidocaine-prilocaine (EMLA) 2.5-2.5 % Cream APPLY CREAM TOPICALLY TO AFFECTED AREA TWICE DAILY AS NEEDED    meloxicam (MOBIC) 15 mg Oral Tablet Take 1 Tablet (15 mg total) by mouth Once a day    pravastatin (PRAVACHOL) 40 mg Oral Tablet Take 1 Tablet (40 mg total) by mouth      Allergies   Allergen Reactions    Amoxicillin      Other Reaction(s): Not available           BP (!) 145/87   Pulse 100   Temp 36.7 C (98 F)   Ht 1.6 m (5\' 3" )   Wt 95.9 kg  (211 lb 6.4 oz)   SpO2 96%   BMI 37.45 kg/m          General: appropriate for age. in no acute distress.    Vital signs are present above and have been reviewed by me     HEENT: Atraumatic, Normocephalic.    Lungs: Nonlabored breathing with symmetric expansion    Heart:Regular wth respect to rate.    Right and left breast with postsurgical changes.  No mass.  No axillary adenopathy.    Abdomen:Soft. Nontender. Nondistended     Psychiatric: Alert and oriented to person, place, and time. affect appropriate       Assessment/Plan:  Assessment/Plan   1. History of breast cancer         There are no noted signs of recurrent disease on current physical exam or radiographic studies.     Doing quite well.     Follow-up 12 months  This note was partially created using voice recognition software and is inherently subject to errors including those of syntax and "sound alike " substitutions which may escape proof reading. In such instances, original meaning may be extrapolated by contextual derivation.    Maura Crandall MD MBA CPE FACS

## 2023-07-03 ENCOUNTER — Encounter (INDEPENDENT_AMBULATORY_CARE_PROVIDER_SITE_OTHER): Payer: Self-pay | Admitting: Surgery

## 2023-07-03 ENCOUNTER — Other Ambulatory Visit: Payer: Self-pay

## 2023-07-03 ENCOUNTER — Ambulatory Visit (INDEPENDENT_AMBULATORY_CARE_PROVIDER_SITE_OTHER): Payer: 59 | Admitting: Surgery

## 2023-07-03 VITALS — BP 145/87 | HR 100 | Temp 98.0°F | Ht 63.0 in | Wt 211.4 lb

## 2023-07-03 DIAGNOSIS — Z853 Personal history of malignant neoplasm of breast: Secondary | ICD-10-CM

## 2023-07-03 DIAGNOSIS — Z08 Encounter for follow-up examination after completed treatment for malignant neoplasm: Secondary | ICD-10-CM

## 2023-07-05 ENCOUNTER — Other Ambulatory Visit (INDEPENDENT_AMBULATORY_CARE_PROVIDER_SITE_OTHER): Payer: Self-pay | Admitting: Surgery

## 2023-07-05 DIAGNOSIS — Z853 Personal history of malignant neoplasm of breast: Secondary | ICD-10-CM

## 2023-07-11 ENCOUNTER — Other Ambulatory Visit: Payer: Self-pay

## 2023-07-11 ENCOUNTER — Ambulatory Visit (INDEPENDENT_AMBULATORY_CARE_PROVIDER_SITE_OTHER)
Admission: RE | Admit: 2023-07-11 | Discharge: 2023-07-11 | Disposition: A | Discharge status: Home / Self Care | Payer: 59 | Source: Ambulatory Visit | Attending: NURSE PRACTITIONER | Admitting: NURSE PRACTITIONER

## 2023-07-11 ENCOUNTER — Ambulatory Visit: Payer: 59 | Attending: NURSE PRACTITIONER | Admitting: NURSE PRACTITIONER

## 2023-07-11 ENCOUNTER — Encounter (INDEPENDENT_AMBULATORY_CARE_PROVIDER_SITE_OTHER): Payer: Self-pay | Admitting: NURSE PRACTITIONER

## 2023-07-11 VITALS — BP 149/91 | HR 99 | Temp 98.7°F | Ht 63.0 in | Wt 212.5 lb

## 2023-07-11 DIAGNOSIS — Z853 Personal history of malignant neoplasm of breast: Secondary | ICD-10-CM | POA: Insufficient documentation

## 2023-07-11 DIAGNOSIS — C50911 Malignant neoplasm of unspecified site of right female breast: Secondary | ICD-10-CM

## 2023-07-11 DIAGNOSIS — Z9011 Acquired absence of right breast and nipple: Secondary | ICD-10-CM | POA: Insufficient documentation

## 2023-07-11 DIAGNOSIS — R918 Other nonspecific abnormal finding of lung field: Secondary | ICD-10-CM | POA: Insufficient documentation

## 2023-07-11 DIAGNOSIS — Z08 Encounter for follow-up examination after completed treatment for malignant neoplasm: Secondary | ICD-10-CM | POA: Insufficient documentation

## 2023-07-11 DIAGNOSIS — Z79811 Long term (current) use of aromatase inhibitors: Secondary | ICD-10-CM | POA: Insufficient documentation

## 2023-07-11 DIAGNOSIS — M8580 Other specified disorders of bone density and structure, unspecified site: Secondary | ICD-10-CM | POA: Insufficient documentation

## 2023-07-11 DIAGNOSIS — Z9221 Personal history of antineoplastic chemotherapy: Secondary | ICD-10-CM | POA: Insufficient documentation

## 2023-07-11 DIAGNOSIS — Z79620 Long term (current) use of immunosuppressive biologic: Secondary | ICD-10-CM | POA: Insufficient documentation

## 2023-07-11 DIAGNOSIS — T451X5A Adverse effect of antineoplastic and immunosuppressive drugs, initial encounter: Secondary | ICD-10-CM | POA: Insufficient documentation

## 2023-07-11 DIAGNOSIS — Z923 Personal history of irradiation: Secondary | ICD-10-CM | POA: Insufficient documentation

## 2023-07-11 DIAGNOSIS — G620 Drug-induced polyneuropathy: Secondary | ICD-10-CM | POA: Insufficient documentation

## 2023-07-11 LAB — COMPREHENSIVE METABOLIC PANEL, NON-FASTING
ALBUMIN/GLOBULIN RATIO: 1.8 — ABNORMAL HIGH (ref 0.8–1.4)
ALBUMIN: 4.7 g/dL (ref 3.5–5.7)
ALKALINE PHOSPHATASE: 68 U/L (ref 34–104)
ALT (SGPT): 23 U/L (ref 7–52)
ANION GAP: 9 mmol/L (ref 4–13)
AST (SGOT): 32 U/L (ref 13–39)
BILIRUBIN TOTAL: 0.5 mg/dL (ref 0.3–1.0)
BUN/CREA RATIO: 15 (ref 6–22)
BUN: 11 mg/dL (ref 7–25)
CALCIUM, CORRECTED: 9.1 mg/dL (ref 8.9–10.8)
CALCIUM: 9.7 mg/dL (ref 8.6–10.3)
CHLORIDE: 103 mmol/L (ref 98–107)
CO2 TOTAL: 28 mmol/L (ref 21–31)
CREATININE: 0.75 mg/dL (ref 0.60–1.30)
ESTIMATED GFR: 89 mL/min/{1.73_m2} (ref >59)
GLOBULIN: 2.6 (ref 2.0–3.5)
GLUCOSE: 84 mg/dL (ref 74–109)
OSMOLALITY, CALCULATED: 278 mosm/kg (ref 270–290)
POTASSIUM: 3.8 mmol/L (ref 3.5–5.1)
PROTEIN TOTAL: 7.3 g/dL (ref 6.4–8.9)
SODIUM: 140 mmol/L (ref 136–145)

## 2023-07-11 LAB — CBC WITH DIFF
BASOPHIL #: 0 10*3/uL (ref 0.00–0.10)
BASOPHIL %: 1 % (ref 0–1)
EOSINOPHIL #: 0.1 10*3/uL (ref 0.00–0.50)
EOSINOPHIL %: 1 % (ref 1–7)
HCT: 43.6 % — ABNORMAL HIGH (ref 31.2–41.9)
HGB: 15.3 g/dL — ABNORMAL HIGH (ref 10.9–14.3)
LYMPHOCYTE #: 1.5 10*3/uL (ref 1.00–3.00)
LYMPHOCYTE %: 23 % (ref 16–44)
MCH: 35.2 pg — ABNORMAL HIGH (ref 24.7–32.8)
MCHC: 35 g/dL (ref 32.3–35.6)
MCV: 100.7 fL — ABNORMAL HIGH (ref 75.5–95.3)
MONOCYTE #: 0.6 10*3/uL (ref 0.30–1.00)
MONOCYTE %: 9 % (ref 5–13)
MPV: 9.8 fL (ref 7.9–10.8)
NEUTROPHIL #: 4.3 10*3/uL (ref 1.85–7.80)
NEUTROPHIL %: 66 % (ref 43–77)
PLATELETS: 186 10*3/uL (ref 140–440)
RBC: 4.33 10*6/uL (ref 3.63–4.92)
RDW: 12.4 % (ref 12.3–17.7)
WBC: 6.5 10*3/uL (ref 3.8–11.8)

## 2023-07-11 MED ORDER — LETROZOLE 2.5 MG TABLET
2.5000 mg | ORAL_TABLET | Freq: Every day | ORAL | 4 refills | Status: DC
Start: 2023-07-11 — End: 2023-12-28

## 2023-07-11 NOTE — Cancer Center Note (Signed)
HEMATOLOGY/ONCOLOGY, [facility]  [address]  [place] [address]  Operated by [facility]     Progress Note    Name: Melanie Snyder MRN:  [id number]   Date: 07/11/2023 DOB: [birth year]       Referring Physician: Esmond Plants, MD  Primary Care Provider: Mariah Milling, DO    Subjective:  Oncologic follow-up of patient with history of infiltrating ductal carcinoma right breast.     ROS:     Review of Systems   Constitutional: Negative.    HENT:  Negative.     Eyes: Negative.    Respiratory: Negative.     Cardiovascular: Negative.    Gastrointestinal: Negative.    Genitourinary: Negative.     Musculoskeletal: Negative.    Skin: Negative.    Neurological: Negative.    Hematological: Negative.       Objective:   BP (!) 149/91 (Site: Left Arm, Patient Position: Sitting, Cuff Size: Adult)   Pulse 99   Temp 37.1 C (98.7 F) (Temporal)   Ht 1.6 m (5\' 3" )   Wt 96.4 kg (212 lb 8 oz)   SpO2 95%   BMI 37.64 kg/m     Physical Exam  Constitutional:       General: She is not in acute distress.     Appearance: Normal appearance.      Comments: Slightly overweight   HENT:      Head: Normocephalic and atraumatic.      Nose: Nose normal.      Mouth/Throat:      Mouth: Mucous membranes are moist.   Eyes:      General: No scleral icterus.  Cardiovascular:      Rate and Rhythm: Normal rate and regular rhythm.      Pulses: Normal pulses.      Heart sounds: Normal heart sounds. No murmur heard.     No friction rub.   Pulmonary:      Effort: Pulmonary effort is normal.      Breath sounds: Normal breath sounds. No wheezing or rhonchi.   Abdominal:      General: Bowel sounds are normal.      Palpations: Abdomen is soft. There is no mass.      Tenderness: There is no abdominal tenderness.      Hernia: No hernia is present.   Musculoskeletal:         General: Normal range of motion.      Cervical back: Normal range of motion and neck supple.      Right lower leg: No edema.      Left lower leg: No edema.    Lymphadenopathy:      Upper Body:      Right upper body: No axillary adenopathy.      Left upper body: No supraclavicular or axillary adenopathy.   Skin:     General: Skin is warm.      Capillary Refill: Capillary refill takes 2 to 3 seconds.      Findings: Bruising (bilateral forearms) and erythema present.   Neurological:      General: No focal deficit present.      Mental Status: She is alert and oriented to person, place, and time. Mental status is at baseline.      Gait: Gait normal.   Psychiatric:         Mood and Affect: Mood normal.         Behavior: Behavior normal.  Thought Content: Thought content normal.         Judgment: Judgment normal.        ECOG Status: 0 - Fully active, able to carry on all pre-disease performance without restriction.       Current Outpatient Medications   Medication Sig    calcium carbonate/vitamin D3 (CALTRATE-600 PLUS VITAMIN D3 ORAL) Caltrate 600 plus D    letrozole (FEMARA) 2.5 mg Oral Tablet Take 1 Tablet (2.5 mg total) by mouth Once a day    levothyroxine (SYNTHROID) 137 mcg Oral Tablet Take 1 Tablet (137 mcg total) by mouth Once a day    lidocaine-prilocaine (EMLA) 2.5-2.5 % Cream APPLY CREAM TOPICALLY TO AFFECTED AREA TWICE DAILY AS NEEDED    meloxicam (MOBIC) 15 mg Oral Tablet Take 1 Tablet (15 mg total) by mouth Once a day    pravastatin (PRAVACHOL) 40 mg Oral Tablet Take 1 Tablet (40 mg total) by mouth     Allergies as of 07/11/2023 - Reviewed 07/11/2023   Allergen Reaction Noted    Amoxicillin  09/14/2020     Labs:  CBC  Diff   Lab Results   Component Value Date/Time    WBC 6.1 05/11/2023 05:25 PM    HGB 14.5 (H) 05/11/2023 05:25 PM    HCT 42.3 (H) 05/11/2023 05:25 PM    PLTCNT 206 05/11/2023 05:25 PM    RBC 4.14 05/11/2023 05:25 PM    MCV 102.2 (H) 05/11/2023 05:25 PM    MCHC 34.2 05/11/2023 05:25 PM    MCH 34.9 (H) 05/11/2023 05:25 PM    RDW 13.2 05/11/2023 05:25 PM    MPV 9.2 05/11/2023 05:25 PM    Lab Results   Component Value Date/Time    PMNS 65  05/11/2023 05:25 PM    LYMPHOCYTES 23 05/11/2023 05:25 PM    EOSINOPHIL 1 05/11/2023 05:25 PM    MONOCYTES 11 05/11/2023 05:25 PM    BASOPHILS 0 05/11/2023 05:25 PM    BASOPHILS 0.00 05/11/2023 05:25 PM    PMNABS 3.90 05/11/2023 05:25 PM    LYMPHSABS 1.40 05/11/2023 05:25 PM    EOSABS 0.10 05/11/2023 05:25 PM    MONOSABS 0.60 05/11/2023 05:25 PM            Comprehensive Metabolic Profile    Lab Results   Component Value Date    SODIUM 139 05/11/2023    POTASSIUM 4.1 05/11/2023    CHLORIDE 103 05/11/2023    CO2 28 05/11/2023    ANIONGAP 8 05/11/2023    BUN 13 05/11/2023    CREATININE 0.87 05/11/2023    ALBUMIN 4.3 05/11/2023    CALCIUM 9.8 05/11/2023    GLUCOSENF 84 05/11/2023    ALKPHOS 67 05/11/2023    ALT 15 05/11/2023    AST 24 05/11/2023    TOTBILIRUBIN 0.6 05/11/2023    TOTALPROTEIN 6.8 05/11/2023          CA 27.29   Date Value Ref Range Status   05/11/2023 11 <38 U/mL Final     Comment:        This test was performed using the Siemens chemilumi-  nescent method. Values obtained from different assay  methods cannot be used interchangeably. CA27.29  levels, regardless of value, should not be interpreted  as absolute evidence of the presence or absence of  disease.              Pathology:  12/13/2022  Final Diagnosis  Left breast, stereotactic biopsy:   Fatty breast parenchyma  with foci of fat necrosis associated with fibrosis, hemosiderin deposition, and dense  microcalcifications.   Small focus of giant cell reaction to polarizable foreign material (less than 1 mm dimension).   No atypia, in-situ carcinoma, or invasive carcinoma is noted.  Electronically signed by Kristeen Mans., MD on 12/14/2022 at 9:36 AM          Radiology:     01/13/2023 CT Chest Abdomen and Pelvis-  FINDINGS:  CT CHEST:  Hardware:  None.  Lymph nodes:   No mediastinal, hilar, or axillary lymphadenopathy.   Heart and Vasculature:  Normal heart size.  No pericardial effusion. Thoracic aorta and pulmonary arteries are unremarkable.      Lungs and Airways:  There is a groundglass density in the subpleural region of the right upper lobe laterally on image #30 of series 204 measuring 6 mm. This is indeterminate and attention to this area on follow-up is recommended. This was present in retrospect on the prior exam which would favor a benign or infectious etiology.   There is a stable 2 mm pulmonary nodule in the left upper lobe on the same image.   Pleura: No pleural effusion.  No pneumothorax.   Bones: Degenerative changes of the thoracic spine.FINDINGS:  CT ABDOMEN/PELVIS:  Liver:   Unremarkable.   Gallbladder:   Unremarkable.   Spleen:   Unremarkable.   Pancreas:   Unremarkable.   Adrenals:   Unremarkable.   Kidneys:   Unremarkable.   Bladder:  Unremarkable.   Uterus and Adnexa:  Unremarkable.          IMPRESSION:  THERE IS A GROUNDGLASS NODULAR DENSITY MEASURING 6 MM IN THE RIGHT UPPER LOBE AND 2 MM SOLID NODULE IN THE LEFT UPPER LOBE. FOLLOW-UP OF BOTH OF THESE FINDINGS IS RECOMMENDED. THERE HAS BEEN NO SIGNIFICANT CHANGE FROM THE PRIOR EXAM WHICH WOULD FAVOR A BENIGN PROCESS.      There are postbiopsy or surgical changes in the upper right breast where there is a persistent fluid collection. There is skin thickening of the right breast which has decreased from the prior exam. There is a stable ovoid mass medial left breast.       Mammogram 11/16/2022  Interval development of a group of calcifications with some linear features in the left retro areolar breast at about 12:00, about 2.7 cm deep to the nipple.   RECOMMENDATION: Left CC and ML 2-D spot mag views of the left retro areolar 12:00 calcifications of concern. (Ordered per Dr. Vinie Sill )       Bone densitometry study 05/04/2022 Normal.   Scan from 04/2020 showed osteopenia of the lumbar spine, the proximal left femur, the right femoral neck with T-score lumbar spine of -1, proximal left femur of -1, right proximal femur of -0.5    Assessment:   1. 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 She then proceeded to adjuvant RT completed in 08/2019. She started adjuvant Letrozole on 08/23/2019 - present. Patient would like to extend her therapy for 7-10 years.   Mammogram ordered per surgeon  Negative Breast Biopsy due to abnormal concern on last mammogram   Signatera test ordered today initial testing.  2. Osteopenia; resolved on Denosumab, we will continue Q 6 months next appointment for October of 2023  05/04/2022 DEXA was normal.    3. 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 Lung Nodules:  Noted on CT of Chest groundglass density in the subpleural region of the right upper lobe laterally on image #30 of series 204 measuring 6 mm. This is indeterminate and attention to this area on follow-up is recommended. This was present in retrospect on the prior exam which would favor a benign or infectious etiology. There is a stable 2 mm pulmonary nodule in the left upper lobe on the same image.  Repeat CT of Chest ordered for 6  months if remains stable we will continue to follow up with imaging if enlarging we will order a PET scan        Plan:   1. Continue with letrozole 2.5 mg p.o. daily, RX provided   2. Continue with Denosumab every 6 months, next due on 11/14/2023   3. RTC 6 months with CBC, CMP, CA27-29 , Signatera testing initial draw, B/L diagnostic mammogram,  11/20/2023 scheduled   4. Bone Density- due after 05/04/2024    The patient was given the opportunity to ask questions, and these were answered to their satisfaction. They are welcome to call with any questions or concerns at any point.      On the day of the  encounter, I spent a total of 30 minutes on this patient encounter including review of historical information, examination, documentation and post-visit activities.       Evie Lacks APRN, FNP-C 11/26/202415:32    CC:  PCP General:  Mariah Milling, DO  [address]  [place] [address]      Portions of this note may be dictated using voice recognition software or a dictation service. Variances in spelling and vocabulary are possible and unintentional. Not all errors are caught/corrected. Please notify the Thereasa Parkin if any discrepancies are noted or if the meaning of any statement is not clear.

## 2023-07-11 NOTE — Nursing Note (Signed)
Signatera mailed.

## 2023-07-18 ENCOUNTER — Encounter (INDEPENDENT_AMBULATORY_CARE_PROVIDER_SITE_OTHER): Payer: Self-pay | Admitting: NURSE PRACTITIONER

## 2023-08-05 LAB — SIGNATERA
SIGNATERA MTM READOUT: 0.69 MTM/ml — AB
SIGNATERA TEST RESULT: POSITIVE — AB

## 2023-08-06 ENCOUNTER — Encounter (INDEPENDENT_AMBULATORY_CARE_PROVIDER_SITE_OTHER): Payer: Self-pay | Admitting: NURSE PRACTITIONER

## 2023-08-07 ENCOUNTER — Telehealth (INDEPENDENT_AMBULATORY_CARE_PROVIDER_SITE_OTHER): Payer: Self-pay | Admitting: NURSE PRACTITIONER

## 2023-08-07 NOTE — Telephone Encounter (Signed)
Placed call to patient and discussed her Signatera test results this am.    Evie Lacks APRN, FNP-C 12/23/202408:50

## 2023-09-07 ENCOUNTER — Other Ambulatory Visit: Payer: Self-pay

## 2023-09-07 ENCOUNTER — Ambulatory Visit: Payer: 59 | Attending: RADIATION ONCOLOGY

## 2023-09-07 ENCOUNTER — Ambulatory Visit
Admission: RE | Admit: 2023-09-07 | Discharge: 2023-09-07 | Disposition: A | Discharge status: Still In Hospital | Payer: 59 | Source: Ambulatory Visit | Attending: RADIATION ONCOLOGY | Admitting: RADIATION ONCOLOGY

## 2023-09-07 DIAGNOSIS — Z79811 Long term (current) use of aromatase inhibitors: Secondary | ICD-10-CM | POA: Insufficient documentation

## 2023-09-07 DIAGNOSIS — Z853 Personal history of malignant neoplasm of breast: Secondary | ICD-10-CM | POA: Insufficient documentation

## 2023-09-07 DIAGNOSIS — Z9221 Personal history of antineoplastic chemotherapy: Secondary | ICD-10-CM | POA: Insufficient documentation

## 2023-09-07 DIAGNOSIS — Z170 Estrogen receptor positive status [ER+]: Secondary | ICD-10-CM

## 2023-09-07 DIAGNOSIS — C50911 Malignant neoplasm of unspecified site of right female breast: Secondary | ICD-10-CM | POA: Insufficient documentation

## 2023-09-07 DIAGNOSIS — Z08 Encounter for follow-up examination after completed treatment for malignant neoplasm: Secondary | ICD-10-CM | POA: Insufficient documentation

## 2023-09-07 DIAGNOSIS — Z9011 Acquired absence of right breast and nipple: Secondary | ICD-10-CM | POA: Insufficient documentation

## 2023-09-07 DIAGNOSIS — C50411 Malignant neoplasm of upper-outer quadrant of right female breast: Secondary | ICD-10-CM

## 2023-09-07 DIAGNOSIS — Z923 Personal history of irradiation: Secondary | ICD-10-CM | POA: Insufficient documentation

## 2023-09-07 NOTE — Progress Notes (Signed)
RADIATION ONCOLOGY FOLLOW-UP NOTE      Patient Name: Melanie Snyder  Med Record #: [id number]  Date of Birth:  [birth year]      SUMMARY     Diagnosis/Stage:  Stage I B T2 N1 M0 grade 2 invasive ductal carcinoma of the right upper-outer quadrant ER positive PR positive HER2 Neu negative status post neoadjuvant chemotherapy lumpectomy sentinel node biopsy and adjuvant external beam radiotherapy completed in January of 2021 continuing on Femara.    Assessment:  68 year old with early breast cancer now 4 years out from adjuvant treatment with positive signatera.      Recommendations:  Repeat signatera at this time.  We will add a PET-CT if this continues to rise.  She will follow up after her testing.    The indications, time course, benefits, risks and side effects of radiation treatment were explained to the patient, and her questions were answered to her apparent satisfaction. I encouraged her to contact us at any time should she have any further questions or concerns. I personally saw and examined the patient, and reviewed all prior imaging and pathologic findings with her. I spent greater than 50% of a 30 minute visit in discussion of the patient's diagnosis and management.    FULL NOTE     Interval History :   Melanie Snyder is a 68 y.o. female with a history of right breast cancer.  She had neoadjuvant chemotherapy and lumpectomy followed by radiation treatments which were completed in January of 2021.  She has continued on Femara without significant complaints.  She had a signatera performed last summer that was positive at 0.69.  She has continued to have mammograms and exams.  She had benign biopsy of the left breast last year.    Pain Assessment:  None    Past Medical/Surgical History:  Past Medical History:   Diagnosis Date    Breast cancer (CMS HCC) 01/11/2022    Cardiac arrhythmia     Hyperlipidemia     Hypothyroidism     Malignant neoplasm of right female breast (CMS HCC)     Osteopenia          Past  Surgical History:   Procedure Laterality Date    CESAREAN SECTION N/A     HX BREAST BIOPSY Right     6 months of chemo and radiation after that    HX BREAST REDUCTION Left            Family History:   Family Medical History:       Problem Relation (Age of Onset)    Cancer Mother, Maternal Grandfather    Coronary Artery Disease Paternal Aunt, Paternal Uncle    No Known Problems Father, Sister, Brother, Maternal Grandmother, Paternal Grandmother, Paternal Grandfather, Daughter, Son, Maternal Aunt, Maternal Uncle, Other              Social History:   Social History     Socioeconomic History    Marital status: Married     Spouse name: Not on file    Number of children: Not on file    Years of education: Not on file    Highest education level: Not on file   Occupational History    Not on file   Tobacco Use    Smoking status: Never    Smokeless tobacco: Never   Vaping Use    Vaping status: Never Used   Substance and Sexual Activity    Alcohol use: Never  Drug use: Never    Sexual activity: Not Currently   Other Topics Concern    Not on file   Social History Narrative    Not on file     Social Determinants of Health     Financial Resource Strain: Not on file   Transportation Needs: Not on file   Social Connections: Not on file   Intimate Partner Violence: Not on file   Housing Stability: Not on file       ALLERGIES:   Allergies   Allergen Reactions    Amoxicillin      Other Reaction(s): Not available        MEDICATIONS:   Current Outpatient Medications   Medication Instructions    calcium carbonate/vitamin D3 (CALTRATE-600 PLUS VITAMIN D3 ORAL) Caltrate 600 plus D    letrozole (FEMARA) 2.5 mg, Oral, DAILY    levothyroxine (SYNTHROID) 137 mcg, Oral, DAILY    lidocaine-prilocaine (EMLA) 2.5-2.5 % Cream APPLY CREAM TOPICALLY TO AFFECTED AREA TWICE DAILY AS NEEDED    meloxicam (MOBIC) 15 mg, Oral, DAILY    pravastatin (PRAVACHOL) 40 mg, Oral        REVIEW OF SYSTEMS  Pertinent review of systems as discussed in Interval  History.      Objective:     Vitals:    09/07/23 0831   BP: (!) 143/90   Pulse: (!) 105   Resp: 16   Temp: 36.4 C (97.5 F)   Weight: 98 kg (216 lb)   Height: 1.6 m (5\' 3" )             PHYSICAL EXAMINATION  Physical Exam  Constitutional:       Appearance: Normal appearance.   Eyes:      Extraocular Movements: Extraocular movements intact.      Pupils: Pupils are equal, round, and reactive to light.   Pulmonary:      Effort: Pulmonary effort is normal.   Musculoskeletal:         General: Normal range of motion.      Cervical back: Normal range of motion.   Neurological:      General: No focal deficit present.      Mental Status: She is alert and oriented to person, place, and time.   Psychiatric:         Mood and Affect: Mood normal.         Behavior: Behavior normal.          LABS/IMAGING: All relevant labs and imaging were reviewed as per HPI.      Molli Barrows, MD 09/07/2023, 08:54    BM:[id number]@

## 2023-09-25 ENCOUNTER — Ambulatory Visit (HOSPITAL_BASED_OUTPATIENT_CLINIC_OR_DEPARTMENT_OTHER): Payer: 59

## 2023-09-27 ENCOUNTER — Encounter (INDEPENDENT_AMBULATORY_CARE_PROVIDER_SITE_OTHER): Payer: Self-pay | Admitting: NURSE PRACTITIONER

## 2023-09-27 NOTE — Telephone Encounter (Signed)
Hi Angela,     My insurance [employer] ([employer]) has denied that blood test   Signatera I had in November, and said I would be responsible for the whole charge of $4900.00     I thought I wouldn't have to pay anything on this test.       They said they denied it because they asked for additional information from you but did not receive it.     Could you possibly send my insurance the information they are requesting, because  there is no way I can pay that.     Thank you so much for your time.  I'm so stressed out about this.     Anders Grant

## 2023-11-06 ENCOUNTER — Encounter (HOSPITAL_COMMUNITY): Payer: Self-pay | Admitting: NURSE PRACTITIONER

## 2023-11-07 ENCOUNTER — Other Ambulatory Visit: Payer: Self-pay

## 2023-11-07 ENCOUNTER — Encounter (HOSPITAL_COMMUNITY): Payer: Self-pay | Admitting: NURSE PRACTITIONER

## 2023-11-07 ENCOUNTER — Other Ambulatory Visit (INDEPENDENT_AMBULATORY_CARE_PROVIDER_SITE_OTHER): Payer: Self-pay | Admitting: NURSE PRACTITIONER

## 2023-11-07 ENCOUNTER — Ambulatory Visit: Payer: 59 | Admitting: RADIATION ONCOLOGY

## 2023-11-07 ENCOUNTER — Ambulatory Visit: Payer: 59

## 2023-11-08 ENCOUNTER — Ambulatory Visit
Admission: RE | Admit: 2023-11-08 | Discharge: 2023-11-08 | Disposition: A | Discharge status: Still In Hospital | Source: Ambulatory Visit | Attending: RADIATION ONCOLOGY | Admitting: RADIATION ONCOLOGY

## 2023-11-08 ENCOUNTER — Other Ambulatory Visit: Payer: Self-pay

## 2023-11-08 ENCOUNTER — Ambulatory Visit: Attending: RADIATION ONCOLOGY

## 2023-11-08 DIAGNOSIS — Z9221 Personal history of antineoplastic chemotherapy: Secondary | ICD-10-CM | POA: Insufficient documentation

## 2023-11-08 DIAGNOSIS — Z9011 Acquired absence of right breast and nipple: Secondary | ICD-10-CM | POA: Insufficient documentation

## 2023-11-08 DIAGNOSIS — C50911 Malignant neoplasm of unspecified site of right female breast: Secondary | ICD-10-CM | POA: Insufficient documentation

## 2023-11-08 DIAGNOSIS — Z923 Personal history of irradiation: Secondary | ICD-10-CM | POA: Insufficient documentation

## 2023-11-08 DIAGNOSIS — Z79811 Long term (current) use of aromatase inhibitors: Secondary | ICD-10-CM | POA: Insufficient documentation

## 2023-11-08 DIAGNOSIS — C50919 Malignant neoplasm of unspecified site of unspecified female breast: Secondary | ICD-10-CM

## 2023-11-08 DIAGNOSIS — Z08 Encounter for follow-up examination after completed treatment for malignant neoplasm: Secondary | ICD-10-CM | POA: Insufficient documentation

## 2023-11-08 DIAGNOSIS — Z853 Personal history of malignant neoplasm of breast: Secondary | ICD-10-CM | POA: Insufficient documentation

## 2023-11-08 NOTE — Progress Notes (Signed)
 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  Clinical Stage IIIA T3N2M0.    Assessment:  68 year old with early breast cancer now 4 years out from adjuvant treatment with positive signatera.      Recommendations:  Repeat signatera at this time.  We will add a PET-CT if this continues to rise.  She will follow up in 3 months or sooner if needed.    The indications, time course, benefits, risks and side effects of radiation treatment were explained to the patient, and her questions were answered to her apparent satisfaction. I encouraged her to contact us  at any time should she have any further questions or concerns. I personally saw and examined the patient, and reviewed all prior imaging and pathologic findings with her. I spent greater than 50% of a 30 minute visit in discussion of the patient's diagnosis and management.    FULL NOTE     Interval History :   CARMESHA MOROCCO is a 68 y.o. female with a history of right breast cancer.  She had neoadjuvant chemotherapy and lumpectomy followed by radiation treatments which were completed in January of 2021.  She has continued on Femara  without significant complaints.  She had a signatera performed last summer that was positive at 0.69.  Repeat Signatera dated 1.28.25 was down to 0.58.  She has continued to have mammograms and exams.  She had benign biopsy of the left breast last year.    Pain Assessment:  None    Past Medical/Surgical History:  Past Medical History:   Diagnosis Date    Breast cancer (CMS [facility]) 01/11/2022    Cardiac arrhythmia     Hyperlipidemia      Hypothyroidism     Malignant neoplasm of right female breast (CMS HCC)     Osteopenia          Past Surgical History:   Procedure Laterality Date    CESAREAN SECTION N/A     HX BREAST BIOPSY Right     6 months of chemo and radiation after that    HX BREAST REDUCTION Left            Family History:   Family Medical History:       Problem Relation (Age of Onset)    Cancer Mother, Maternal Grandfather    Coronary Artery Disease Paternal Aunt, Paternal Uncle    No Known Problems Father, Sister, Brother, Maternal Grandmother, Paternal Grandmother, Paternal Grandfather, Daughter, Son, Maternal Aunt, Maternal Uncle, Other              Social History:   Social History     Socioeconomic History    Marital status: Married     Spouse name: Not on file    Number of children: Not on file    Years of education: Not on file    Highest education level: Not on file   Occupational History    Not on file   Tobacco Use    Smoking status: Never    Smokeless tobacco: Never   Vaping Use  Vaping status: Never Used   Substance and Sexual Activity    Alcohol use: Never    Drug use: Never    Sexual activity: Not Currently   Other Topics Concern    Not on file   Social History Narrative    Not on file     Social Determinants of Health     Financial Resource Strain: Not on file   Transportation Needs: Not on file   Social Connections: Not on file   Intimate Partner Violence: Not on file   Housing Stability: Not on file       ALLERGIES:   Allergies   Allergen Reactions    Amoxicillin      Other Reaction(s): Not available        MEDICATIONS:   Current Outpatient Medications   Medication Instructions    calcium carbonate/vitamin D3 (CALTRATE-600 PLUS VITAMIN D3 ORAL) Caltrate 600 plus D    letrozole  (FEMARA ) 2.5 mg, Oral, Daily    levothyroxine (SYNTHROID) 137 mcg, Oral, Daily    lidocaine -prilocaine (EMLA) 2.5-2.5 % Cream APPLY CREAM TOPICALLY TO AFFECTED AREA TWICE DAILY AS NEEDED    meloxicam (MOBIC) 15 mg, Oral, Daily    pravastatin  (PRAVACHOL) 40 mg, Oral        REVIEW OF SYSTEMS  Pertinent review of systems as discussed in Interval History.      Objective:     Vitals:    11/08/23 0914   Pulse: (!) 2   Temp: 36.3 C (97.3 F)   Weight: 98 kg (216 lb)   Height: 1.6 m (5\' 3" )             PHYSICAL EXAMINATION  Physical Exam  Constitutional:       Appearance: Normal appearance.   Eyes:      Extraocular Movements: Extraocular movements intact.      Pupils: Pupils are equal, round, and reactive to light.   Pulmonary:      Effort: Pulmonary effort is normal.   Musculoskeletal:         General: Normal range of motion.      Cervical back: Normal range of motion.   Neurological:      General: No focal deficit present.      Mental Status: She is alert and oriented to person, place, and time.   Psychiatric:         Mood and Affect: Mood normal.         Behavior: Behavior normal.          LABS/IMAGING: All relevant labs and imaging were reviewed as per HPI.      Olita Best, MD 11/08/2023, 09:54    ZO:[id number]@

## 2023-11-13 ENCOUNTER — Other Ambulatory Visit: Payer: Self-pay

## 2023-11-13 ENCOUNTER — Encounter (HOSPITAL_COMMUNITY): Payer: Self-pay | Admitting: NURSE PRACTITIONER

## 2023-11-14 ENCOUNTER — Ambulatory Visit
Admission: RE | Admit: 2023-11-14 | Discharge: 2023-11-14 | Disposition: A | Discharge status: Home / Self Care | Payer: 59 | Source: Ambulatory Visit | Attending: NURSE PRACTITIONER | Admitting: NURSE PRACTITIONER

## 2023-11-14 ENCOUNTER — Other Ambulatory Visit: Payer: Self-pay

## 2023-11-14 ENCOUNTER — Other Ambulatory Visit (HOSPITAL_BASED_OUTPATIENT_CLINIC_OR_DEPARTMENT_OTHER): Payer: Self-pay | Admitting: RADIATION ONCOLOGY

## 2023-11-14 VITALS — BP 143/80 | HR 91 | Temp 97.0°F | Resp 17

## 2023-11-14 DIAGNOSIS — M8580 Other specified disorders of bone density and structure, unspecified site: Secondary | ICD-10-CM | POA: Insufficient documentation

## 2023-11-14 DIAGNOSIS — N959 Unspecified menopausal and perimenopausal disorder: Secondary | ICD-10-CM | POA: Insufficient documentation

## 2023-11-14 DIAGNOSIS — C50919 Malignant neoplasm of unspecified site of unspecified female breast: Secondary | ICD-10-CM | POA: Insufficient documentation

## 2023-11-14 LAB — PHOSPHORUS: PHOSPHORUS: 4 mg/dL (ref 3.7–7.2)

## 2023-11-14 LAB — CREATININE WITH EGFR
CREATININE: 0.85 mg/dL (ref 0.60–1.30)
ESTIMATED GFR: 76 mL/min/{1.73_m2} (ref >59)

## 2023-11-14 LAB — CALCIUM: CALCIUM: 9.3 mg/dL (ref 8.6–10.3)

## 2023-11-14 LAB — MAGNESIUM: MAGNESIUM: 2.2 mg/dL (ref 1.9–2.7)

## 2023-11-14 MED ORDER — DENOSUMAB 60 MG/ML SUBCUTANEOUS SYRINGE
60.0000 mg | INJECTION | Freq: Once | SUBCUTANEOUS | Status: AC
Start: 2023-11-14 — End: 2023-11-14
  Administered 2023-11-14: 60 mg via SUBCUTANEOUS
  Filled 2023-11-14: qty 1

## 2023-11-14 NOTE — Nurses Notes (Signed)
 1610 patient arrived to OP ONC ambulatory. Patient here for labs and prolia  injection. Tad Eye, RN  [phone] VSS. Tad Eye, RN  0930 labs collected by lab personal. Tad Eye, RN  [phone] prolia  injection given sub Q in the left arm. Patient tolerated well. Tad Eye, RN  1020 patient left floor ambulatory at this time Tad Eye, RN

## 2023-11-16 ENCOUNTER — Ambulatory Visit
Admission: RE | Admit: 2023-11-16 | Discharge: 2023-11-16 | Disposition: A | Discharge status: Home / Self Care | Source: Ambulatory Visit | Attending: RADIATION ONCOLOGY | Admitting: RADIATION ONCOLOGY

## 2023-11-16 ENCOUNTER — Other Ambulatory Visit: Payer: Self-pay

## 2023-11-16 DIAGNOSIS — J320 Chronic maxillary sinusitis: Secondary | ICD-10-CM

## 2023-11-16 DIAGNOSIS — R911 Solitary pulmonary nodule: Secondary | ICD-10-CM | POA: Insufficient documentation

## 2023-11-16 DIAGNOSIS — M898X2 Other specified disorders of bone, upper arm: Secondary | ICD-10-CM

## 2023-11-16 DIAGNOSIS — J0100 Acute maxillary sinusitis, unspecified: Secondary | ICD-10-CM | POA: Insufficient documentation

## 2023-11-16 DIAGNOSIS — C50911 Malignant neoplasm of unspecified site of right female breast: Secondary | ICD-10-CM | POA: Insufficient documentation

## 2023-11-16 DIAGNOSIS — R937 Abnormal findings on diagnostic imaging of other parts of musculoskeletal system: Secondary | ICD-10-CM

## 2023-11-20 ENCOUNTER — Other Ambulatory Visit (HOSPITAL_BASED_OUTPATIENT_CLINIC_OR_DEPARTMENT_OTHER): Payer: Self-pay | Admitting: RADIATION ONCOLOGY

## 2023-11-20 ENCOUNTER — Ambulatory Visit
Admission: RE | Admit: 2023-11-20 | Discharge: 2023-11-20 | Disposition: A | Discharge status: Home / Self Care | Payer: Self-pay | Source: Ambulatory Visit | Attending: Surgery | Admitting: Surgery

## 2023-11-20 ENCOUNTER — Other Ambulatory Visit: Payer: Self-pay

## 2023-11-20 ENCOUNTER — Encounter (HOSPITAL_COMMUNITY): Payer: Self-pay

## 2023-11-20 DIAGNOSIS — C50911 Malignant neoplasm of unspecified site of right female breast: Secondary | ICD-10-CM

## 2023-11-20 DIAGNOSIS — Z1231 Encounter for screening mammogram for malignant neoplasm of breast: Secondary | ICD-10-CM | POA: Insufficient documentation

## 2023-11-20 DIAGNOSIS — Z853 Personal history of malignant neoplasm of breast: Secondary | ICD-10-CM | POA: Insufficient documentation

## 2023-11-20 HISTORY — DX: Malignant neoplasm of unspecified site of unspecified female breast: C50.919

## 2023-11-21 ENCOUNTER — Encounter (INDEPENDENT_AMBULATORY_CARE_PROVIDER_SITE_OTHER): Payer: Self-pay | Admitting: Surgery

## 2023-11-21 ENCOUNTER — Other Ambulatory Visit (INDEPENDENT_AMBULATORY_CARE_PROVIDER_SITE_OTHER): Payer: Self-pay | Admitting: Surgery

## 2023-11-21 DIAGNOSIS — R928 Other abnormal and inconclusive findings on diagnostic imaging of breast: Secondary | ICD-10-CM

## 2023-11-28 ENCOUNTER — Ambulatory Visit (HOSPITAL_COMMUNITY)

## 2023-11-28 ENCOUNTER — Ambulatory Visit: Payer: 59

## 2023-11-29 ENCOUNTER — Encounter (HOSPITAL_COMMUNITY): Payer: Self-pay

## 2023-11-29 ENCOUNTER — Other Ambulatory Visit: Payer: Self-pay

## 2023-11-29 ENCOUNTER — Ambulatory Visit
Admission: RE | Admit: 2023-11-29 | Discharge: 2023-11-29 | Disposition: A | Discharge status: Home / Self Care | Payer: Self-pay | Source: Ambulatory Visit | Attending: Surgery | Admitting: Surgery

## 2023-11-29 DIAGNOSIS — Z9889 Other specified postprocedural states: Secondary | ICD-10-CM

## 2023-11-29 DIAGNOSIS — Z923 Personal history of irradiation: Secondary | ICD-10-CM

## 2023-11-29 DIAGNOSIS — R920 Mammographic microcalcification found on diagnostic imaging of breast: Secondary | ICD-10-CM

## 2023-11-29 DIAGNOSIS — R928 Other abnormal and inconclusive findings on diagnostic imaging of breast: Secondary | ICD-10-CM | POA: Insufficient documentation

## 2023-11-29 DIAGNOSIS — R92321 Mammographic fibroglandular density, right breast: Secondary | ICD-10-CM

## 2023-11-30 ENCOUNTER — Ambulatory Visit (HOSPITAL_COMMUNITY)

## 2023-12-10 ENCOUNTER — Other Ambulatory Visit: Payer: Self-pay

## 2023-12-10 ENCOUNTER — Ambulatory Visit (HOSPITAL_BASED_OUTPATIENT_CLINIC_OR_DEPARTMENT_OTHER)
Admission: RE | Admit: 2023-12-10 | Discharge: 2023-12-10 | Disposition: A | Discharge status: Home / Self Care | Payer: Self-pay | Source: Ambulatory Visit | Attending: RADIATION ONCOLOGY | Admitting: RADIATION ONCOLOGY

## 2023-12-10 ENCOUNTER — Encounter (HOSPITAL_COMMUNITY): Payer: Self-pay | Admitting: NURSE PRACTITIONER

## 2023-12-10 ENCOUNTER — Ambulatory Visit
Admission: RE | Admit: 2023-12-10 | Discharge: 2023-12-10 | Disposition: A | Discharge status: Home / Self Care | Payer: Self-pay | Source: Ambulatory Visit | Attending: RADIATION ONCOLOGY | Admitting: RADIATION ONCOLOGY

## 2023-12-10 DIAGNOSIS — C50911 Malignant neoplasm of unspecified site of right female breast: Secondary | ICD-10-CM

## 2023-12-10 MED ORDER — GADOBUTROL 10 MMOL/10 ML (1 MMOL/ML) INTRAVENOUS SOLUTION
10.0000 mL | INTRAVENOUS | Status: AC
Start: 2023-12-10 — End: 2023-12-10
  Administered 2023-12-10: 9 mL via INTRAVENOUS

## 2023-12-11 DIAGNOSIS — C50911 Malignant neoplasm of unspecified site of right female breast: Secondary | ICD-10-CM

## 2023-12-11 DIAGNOSIS — M4316 Spondylolisthesis, lumbar region: Secondary | ICD-10-CM

## 2023-12-13 ENCOUNTER — Other Ambulatory Visit: Payer: Self-pay

## 2023-12-13 ENCOUNTER — Ambulatory Visit
Admission: RE | Admit: 2023-12-13 | Discharge: 2023-12-13 | Disposition: A | Discharge status: Still In Hospital | Source: Ambulatory Visit | Attending: RADIATION ONCOLOGY | Admitting: RADIATION ONCOLOGY

## 2023-12-13 DIAGNOSIS — Z79811 Long term (current) use of aromatase inhibitors: Secondary | ICD-10-CM | POA: Insufficient documentation

## 2023-12-13 DIAGNOSIS — Z853 Personal history of malignant neoplasm of breast: Secondary | ICD-10-CM | POA: Insufficient documentation

## 2023-12-13 DIAGNOSIS — Z08 Encounter for follow-up examination after completed treatment for malignant neoplasm: Secondary | ICD-10-CM | POA: Insufficient documentation

## 2023-12-13 DIAGNOSIS — C50411 Malignant neoplasm of upper-outer quadrant of right female breast: Secondary | ICD-10-CM

## 2023-12-13 DIAGNOSIS — Z923 Personal history of irradiation: Secondary | ICD-10-CM

## 2023-12-13 DIAGNOSIS — Z17411 Hormone receptor positive with human epidermal growth factor receptor 2 negative status: Secondary | ICD-10-CM

## 2023-12-13 DIAGNOSIS — C7951 Secondary malignant neoplasm of bone: Secondary | ICD-10-CM

## 2023-12-13 DIAGNOSIS — Z9221 Personal history of antineoplastic chemotherapy: Secondary | ICD-10-CM | POA: Insufficient documentation

## 2023-12-13 DIAGNOSIS — Z9011 Acquired absence of right breast and nipple: Secondary | ICD-10-CM | POA: Insufficient documentation

## 2023-12-13 NOTE — Progress Notes (Signed)
 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  Clinical Stage IIIA T3N2M0.  Now with recurrence noted on Signatera testing and MRI.    Assessment:  68 year old with recurrent breast cancer breast cancer.      Recommendations:  I have recommended palliative radiotherapy to the humerus and the spine.  She will also be seeing Dr. Latisha Poland for consideration of systemic therapy.  I have discussed the side effects and benefits of the treatment at length and she wishes to proceed.    The indications, time course, benefits, risks and side effects of radiation treatment were explained to the patient, and her questions were answered to her apparent satisfaction. I encouraged her to contact us  at any time should she have any further questions or concerns. I personally saw and examined the patient, and reviewed all prior imaging and pathologic findings with her. I spent greater than 50% of a 30 minute visit in discussion of the patient's diagnosis and management.    FULL NOTE     Interval History :   Melanie Snyder is a 68 y.o. female with a history of right breast cancer.  She had neoadjuvant chemotherapy and lumpectomy followed by radiation treatments which were completed in January of 2021.  She has continued on Femara  without significant complaints.  She had a signatera performed last summer that was positive at 0.69.  Repeat Signatera dated 1.28.25 was down to 0.58.  She has continued to have mammograms and exams.  She had benign biopsy of the left breast last year.  More recent Signatera showed increase.  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  She has some occasional tingling down the left arm but otherwise is without complaints.    Pain Assessment:  None    Past Medical/Surgical History:  Past Medical History:   Diagnosis Date    Breast CA     RIGHT CHEMO/RADIATION    Breast cancer 01/11/2022    Cardiac arrhythmia     Hyperlipidemia     Hypothyroidism     Malignant neoplasm of right female breast     Osteopenia          Past Surgical History:   Procedure Laterality Date    CESAREAN SECTION N/A     HX BREAST BIOPSY Right     6 months of chemo and radiation after that    HX BREAST LUMPECTOMY Right     CHEMO/RADIATION    HX BREAST REDUCTION Left            Family History:   Family Medical History:       Problem Relation (Age of Onset)    Cancer Mother, Maternal Grandfather    Coronary Artery Disease Paternal Aunt, Paternal Uncle    No Known Problems Father, Sister, Brother, Maternal Grandmother, Paternal Grandmother, Paternal Grandfather, Daughter, Son, Maternal Aunt, Maternal Uncle, Other              Social History:   Social History  Socioeconomic History    Marital status: Married     Spouse name: Not on file    Number of children: Not on file    Years of education: Not on file    Highest education level: Not on file   Occupational History    Not on file   Tobacco Use    Smoking status: Never    Smokeless tobacco: Never   Vaping Use    Vaping status: Never Used   Substance and Sexual Activity    Alcohol use: Never    Drug use: Never    Sexual activity: Not Currently   Other Topics Concern    Not on file   Social History Narrative    Not on file     Social Determinants of Health     Financial Resource Strain: Not on file   Transportation Needs: Not on file   Social Connections: Not on file   Intimate Partner Violence: Not on file   Housing Stability: Not on file       ALLERGIES:   Allergies   Allergen Reactions    Amoxicillin      Other Reaction(s): Not  available        MEDICATIONS:   Current Outpatient Medications   Medication Instructions    calcium carbonate/vitamin D3 (CALTRATE-600 PLUS VITAMIN D3 ORAL) Caltrate 600 plus D    letrozole  (FEMARA ) 2.5 mg, Oral, Daily    levothyroxine (SYNTHROID) 137 mcg, Oral, Daily    lidocaine -prilocaine (EMLA) 2.5-2.5 % Cream APPLY CREAM TOPICALLY TO AFFECTED AREA TWICE DAILY AS NEEDED    meloxicam (MOBIC) 15 mg, Oral, Daily    pravastatin (PRAVACHOL) 40 mg, Oral        REVIEW OF SYSTEMS  Pertinent review of systems as discussed in Interval History.      Objective:     Vitals:    12/13/23 1324   BP: (!) 156/99   Pulse: (!) 103   Resp: 16   Temp: 36.6 C (97.9 F)   Weight: 95.7 kg (211 lb)   Height: 1.613 m (5' 3.5")             PHYSICAL EXAMINATION  Physical Exam  Constitutional:       Appearance: Normal appearance.   Eyes:      Extraocular Movements: Extraocular movements intact.      Pupils: Pupils are equal, round, and reactive to light.   Pulmonary:      Effort: Pulmonary effort is normal.   Musculoskeletal:         General: Normal range of motion.      Cervical back: Normal range of motion.   Neurological:      General: No focal deficit present.      Mental Status: She is alert and oriented to person, place, and time.   Psychiatric:         Mood and Affect: Mood normal.         Behavior: Behavior normal.          LABS/IMAGING: All relevant labs and imaging were reviewed as per HPI.      Olita Best, MD 12/13/2023, 15:26    RU:[id number]@

## 2023-12-20 ENCOUNTER — Other Ambulatory Visit (HOSPITAL_BASED_OUTPATIENT_CLINIC_OR_DEPARTMENT_OTHER): Payer: Self-pay | Admitting: RADIATION ONCOLOGY

## 2023-12-20 DIAGNOSIS — C7952 Secondary malignant neoplasm of bone marrow: Secondary | ICD-10-CM

## 2023-12-21 ENCOUNTER — Other Ambulatory Visit: Payer: Self-pay

## 2023-12-21 ENCOUNTER — Inpatient Hospital Stay (HOSPITAL_BASED_OUTPATIENT_CLINIC_OR_DEPARTMENT_OTHER)
Admission: RE | Admit: 2023-12-21 | Discharge: 2023-12-21 | Disposition: A | Discharge status: Still In Hospital | Source: Ambulatory Visit | Attending: RADIATION ONCOLOGY | Admitting: RADIATION ONCOLOGY

## 2023-12-21 ENCOUNTER — Ambulatory Visit
Admission: RE | Admit: 2023-12-21 | Discharge: 2023-12-21 | Disposition: A | Discharge status: Still In Hospital | Source: Ambulatory Visit | Attending: RADIATION ONCOLOGY | Admitting: RADIATION ONCOLOGY

## 2023-12-21 DIAGNOSIS — C7952 Secondary malignant neoplasm of bone marrow: Secondary | ICD-10-CM

## 2023-12-21 DIAGNOSIS — C7951 Secondary malignant neoplasm of bone: Secondary | ICD-10-CM | POA: Insufficient documentation

## 2023-12-21 DIAGNOSIS — Z510 Encounter for antineoplastic radiation therapy: Secondary | ICD-10-CM | POA: Insufficient documentation

## 2023-12-21 DIAGNOSIS — C50919 Malignant neoplasm of unspecified site of unspecified female breast: Secondary | ICD-10-CM | POA: Insufficient documentation

## 2023-12-25 ENCOUNTER — Encounter (INDEPENDENT_AMBULATORY_CARE_PROVIDER_SITE_OTHER): Payer: Self-pay | Admitting: NURSE PRACTITIONER

## 2023-12-25 ENCOUNTER — Encounter (HOSPITAL_COMMUNITY): Payer: Self-pay | Admitting: NURSE PRACTITIONER

## 2023-12-26 ENCOUNTER — Ambulatory Visit (HOSPITAL_BASED_OUTPATIENT_CLINIC_OR_DEPARTMENT_OTHER)

## 2023-12-26 DIAGNOSIS — C7951 Secondary malignant neoplasm of bone: Secondary | ICD-10-CM

## 2023-12-26 DIAGNOSIS — C7952 Secondary malignant neoplasm of bone marrow: Secondary | ICD-10-CM

## 2023-12-27 ENCOUNTER — Telehealth (INDEPENDENT_AMBULATORY_CARE_PROVIDER_SITE_OTHER): Payer: Self-pay | Admitting: NURSE PRACTITIONER

## 2023-12-27 ENCOUNTER — Encounter (HOSPITAL_COMMUNITY): Payer: Self-pay | Admitting: NURSE PRACTITIONER

## 2023-12-27 ENCOUNTER — Telehealth (INDEPENDENT_AMBULATORY_CARE_PROVIDER_SITE_OTHER): Payer: Self-pay | Admitting: HEMATOLOGY-ONCOLOGY

## 2023-12-27 ENCOUNTER — Ambulatory Visit (HOSPITAL_BASED_OUTPATIENT_CLINIC_OR_DEPARTMENT_OTHER)

## 2023-12-27 ENCOUNTER — Other Ambulatory Visit: Payer: Self-pay

## 2023-12-27 DIAGNOSIS — C7952 Secondary malignant neoplasm of bone marrow: Secondary | ICD-10-CM

## 2023-12-27 DIAGNOSIS — C7951 Secondary malignant neoplasm of bone: Secondary | ICD-10-CM

## 2023-12-27 NOTE — Telephone Encounter (Signed)
 Called to  inform her that we spoke with Dr. Jerona Mooring and he states we do not need to do any more imaging at this time, so I have cancelled the CT scan for next week, this is just extra exposure of radiation. She said she had talk to Dr Jerona Mooring yesterday

## 2023-12-27 NOTE — Telephone Encounter (Signed)
 Per Cathlean Co , RN, pt received denial from [employer] stating Prolia  was not authorized . Checked authorization and per Megan, there was a note stating that no auth was required. Called [employer] and spoke with [place] who stated that it was denied due to cost being over $5,000 and on the portal when an authorization is entered, it usually asks for cost since anything over $5,000 has to have a predetermination.  I will submit medical records and claim for 11/14/23 Prolia  injection for retro-auth.

## 2023-12-28 ENCOUNTER — Telehealth (INDEPENDENT_AMBULATORY_CARE_PROVIDER_SITE_OTHER): Payer: Self-pay | Admitting: HEMATOLOGY-ONCOLOGY

## 2023-12-28 ENCOUNTER — Ambulatory Visit (INDEPENDENT_AMBULATORY_CARE_PROVIDER_SITE_OTHER)
Admission: RE | Admit: 2023-12-28 | Discharge: 2023-12-28 | Disposition: A | Discharge status: Home / Self Care | Payer: Self-pay | Source: Ambulatory Visit | Attending: HEMATOLOGY-ONCOLOGY | Admitting: HEMATOLOGY-ONCOLOGY

## 2023-12-28 ENCOUNTER — Other Ambulatory Visit: Payer: Self-pay

## 2023-12-28 ENCOUNTER — Encounter (HOSPITAL_COMMUNITY): Payer: Self-pay | Admitting: NURSE PRACTITIONER

## 2023-12-28 ENCOUNTER — Other Ambulatory Visit (INDEPENDENT_AMBULATORY_CARE_PROVIDER_SITE_OTHER): Payer: Self-pay | Admitting: HEMATOLOGY-ONCOLOGY

## 2023-12-28 ENCOUNTER — Ambulatory Visit: Payer: Self-pay | Attending: HEMATOLOGY-ONCOLOGY | Admitting: HEMATOLOGY-ONCOLOGY

## 2023-12-28 ENCOUNTER — Encounter (INDEPENDENT_AMBULATORY_CARE_PROVIDER_SITE_OTHER): Payer: Self-pay | Admitting: HEMATOLOGY-ONCOLOGY

## 2023-12-28 VITALS — BP 148/85 | HR 105 | Temp 98.2°F | Ht 63.0 in | Wt 213.4 lb

## 2023-12-28 DIAGNOSIS — C7951 Secondary malignant neoplasm of bone: Secondary | ICD-10-CM | POA: Insufficient documentation

## 2023-12-28 DIAGNOSIS — Z79811 Long term (current) use of aromatase inhibitors: Secondary | ICD-10-CM | POA: Insufficient documentation

## 2023-12-28 DIAGNOSIS — C50919 Malignant neoplasm of unspecified site of unspecified female breast: Secondary | ICD-10-CM | POA: Insufficient documentation

## 2023-12-28 DIAGNOSIS — Z79899 Other long term (current) drug therapy: Secondary | ICD-10-CM | POA: Insufficient documentation

## 2023-12-28 DIAGNOSIS — C50911 Malignant neoplasm of unspecified site of right female breast: Secondary | ICD-10-CM

## 2023-12-28 DIAGNOSIS — Z170 Estrogen receptor positive status [ER+]: Secondary | ICD-10-CM | POA: Insufficient documentation

## 2023-12-28 DIAGNOSIS — Z1732 Human epidermal growth factor receptor 2 negative status: Secondary | ICD-10-CM | POA: Insufficient documentation

## 2023-12-28 MED ORDER — RIBOCICLIB 600 MG/DAY (200 MG X 3) TABLETS
600.0000 mg | ORAL_TABLET | Freq: Every day | ORAL | 11 refills | Status: DC
Start: 2023-12-28 — End: 2024-06-20
  Filled 2023-12-28: qty 63, 28d supply, fill #0
  Filled 2024-01-30: qty 63, 28d supply, fill #1
  Filled 2024-02-27: qty 63, 28d supply, fill #2
  Filled 2024-03-26: qty 63, 28d supply, fill #3
  Filled 2024-04-26: qty 63, 28d supply, fill #4
  Filled 2024-05-21: qty 63, 28d supply, fill #5
  Filled 2024-06-18: qty 63, 28d supply, fill #6

## 2023-12-28 MED ORDER — EXEMESTANE 25 MG TABLET
25.0000 mg | ORAL_TABLET | Freq: Every morning | ORAL | 11 refills | Status: DC
Start: 2023-12-28 — End: 2024-04-26

## 2023-12-28 MED ORDER — RIBOCICLIB 600 MG/DAY (200 MG X 3) TABLETS
600.0000 mg | ORAL_TABLET | Freq: Every day | ORAL | 11 refills | Status: DC
Start: 2023-12-28 — End: 2023-12-28

## 2023-12-28 NOTE — Nursing Note (Signed)
 Went over [employer]. Went over scans. Patient sees baisden and is hoping to start radiation next week, 10 treatments. Changing treatment. Went over side effects, information given and chemo consent signed.

## 2023-12-28 NOTE — Progress Notes (Signed)
 Department of Hematology/Oncology  Progress Note   Name: Melanie Snyder  FAO:[id number]  Date of Birth: 10/02/1955  Encounter Date: 12/28/2023    REFERRING PROVIDER:  Verl Glatter, MD  [address]  [place],  [place] [address]    REASON FOR OFFICE VISIT:  Breast Cancer     HISTORY OF PRESENT ILLNESS:  Melanie Snyder is a 68 y.o. female who presents today for assistance with managing metastatic breast cancer.    The patient has a history of infiltrating ductal carcinoma of the right breast.  She was stage III at the time of initial diagnosis.  T3 N2 M0, grade 2.  ER positive and HER2 Neu negative.  She underwent adjuvant therapy with Adriamycin and Cytoxan followed by paclitaxel.  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      She had adjuvant radiation therapy, which completed in January of 2021.  She has been on treatment with adjuvant letrozole  since January of 2021.  Late last year she was found to have circulating tumor DNA present.  This was followed by an imaging study which showed a left humerus metastatic deposit as well as a possible lesion in the lumbar spine.    ROS:   Pertinent review of systems as discussed in HPI    HISTORY:  Past Medical History:   Diagnosis Date    Breast CA     RIGHT CHEMO/RADIATION    Breast cancer 01/11/2022    Cardiac arrhythmia     Hyperlipidemia     Hypothyroidism     Malignant neoplasm of right female breast     Osteopenia          Past Surgical History:   Procedure Laterality Date    CESAREAN SECTION N/A     HX BREAST BIOPSY Right     6 months of chemo and radiation after that    HX BREAST LUMPECTOMY Right     CHEMO/RADIATION    HX BREAST REDUCTION Left          Social History     Socioeconomic History    Marital status: Married     Spouse name: Not on file    Number of children: Not on file    Years of education: Not on file    Highest education level: Not on file   Occupational History    Not on file    Tobacco Use    Smoking status: Never    Smokeless tobacco: Never   Vaping Use    Vaping status: Never Used   Substance and Sexual Activity    Alcohol use: Never    Drug use: Never    Sexual activity: Not Currently   Other Topics Concern    Not on file   Social History Narrative    Not on file     Social Determinants of Health     Financial Resource Strain: Not on file   Transportation Needs: Not on file   Social Connections: Not on file   Intimate Partner Violence: Not on file   Housing Stability: Not on file     Family Medical History:       Problem Relation (Age of Onset)    Cancer Mother, Maternal Grandfather    Coronary Artery Disease Paternal Aunt, Paternal Uncle    No Known Problems Father, Sister, Brother, Maternal Grandmother, Paternal Grandmother, Paternal Grandfather, Daughter, Son, Maternal Aunt, Maternal Uncle, Other  Current Outpatient Medications   Medication Sig    calcium carbonate/vitamin D3 (CALTRATE-600 PLUS VITAMIN D3 ORAL) Caltrate 600 plus D    cetirizine (ZYRTEC) 10 mg Oral Tablet Take 1 Tablet (10 mg total) by mouth Once per day as needed    exemestane (AROMASIN) 25 mg Oral Tablet Take 1 Tablet (25 mg total) by mouth Every morning after breakfast    levothyroxine (SYNTHROID) 112 mcg Oral Tablet     lidocaine -prilocaine (EMLA) 2.5-2.5 % Cream APPLY CREAM TOPICALLY TO AFFECTED AREA TWICE DAILY AS NEEDED    meloxicam (MOBIC) 15 mg Oral Tablet Take 1 Tablet (15 mg total) by mouth Daily    pravastatin (PRAVACHOL) 40 mg Oral Tablet Take 1 Tablet (40 mg total) by mouth    ribociclib (KISQALI) 600 mg/day (200 mg x 3) Oral Tablet Take 3 Tablets (600 mg total) by mouth Daily for 21 days followed by 7 days off. Repeat every 28 days.     Allergies   Allergen Reactions    Amoxicillin      Other Reaction(s): Not available       PHYSICAL EXAM:  Most Recent Vitals    Flowsheet Row Office Visit from 12/28/2023 in Hematology/Oncology, [facility]   Temperature 36.8 C (98.2 F) filed at...  12/28/2023 0847   Heart Rate 105 filed at... 12/28/2023 0847   Respiratory Rate --   BP (Non-Invasive) 148/85 filed at... 12/28/2023 0847   SpO2 99 % filed at... 12/28/2023 0847   Height 1.6 m (5\' 3" ) filed at... 12/28/2023 0847   Weight 96.8 kg (213 lb 6.4 oz) filed at... 12/28/2023 0847   BMI (Calculated) 37.88 filed at... 12/28/2023 0847   BSA (Calculated) 2.07 filed at... 12/28/2023 0847      ECOG Status: (0) Fully active, able to carry on all predisease performance without restriction   Physical Exam    DIAGNOSTIC DATA:  No results found for this or any previous visit (from the past 24401 hours).    LABS:   CBC  Diff   Lab Results   Component Value Date/Time    WBC 6.5 07/11/2023 04:09 PM    HGB 15.3 (H) 07/11/2023 04:09 PM    HCT 43.6 (H) 07/11/2023 04:09 PM    PLTCNT 186 07/11/2023 04:09 PM    RBC 4.33 07/11/2023 04:09 PM    MCV 100.7 (H) 07/11/2023 04:09 PM    MCHC 35.0 07/11/2023 04:09 PM    MCH 35.2 (H) 07/11/2023 04:09 PM    RDW 12.4 07/11/2023 04:09 PM    MPV 9.8 07/11/2023 04:09 PM    Lab Results   Component Value Date/Time    PMNS 66 07/11/2023 04:09 PM    LYMPHOCYTES 23 07/11/2023 04:09 PM    EOSINOPHIL 1 07/11/2023 04:09 PM    MONOCYTES 9 07/11/2023 04:09 PM    BASOPHILS 1 07/11/2023 04:09 PM    BASOPHILS 0.00 07/11/2023 04:09 PM    PMNABS 4.30 07/11/2023 04:09 PM    LYMPHSABS 1.50 07/11/2023 04:09 PM    EOSABS 0.10 07/11/2023 04:09 PM    MONOSABS 0.60 07/11/2023 04:09 PM            Comprehensive Metabolic Profile    Lab Results   Component Value Date    SODIUM 140 07/11/2023    POTASSIUM 3.8 07/11/2023    CHLORIDE 103 07/11/2023    CO2 28 07/11/2023    ANIONGAP 9 07/11/2023    BUN 11 07/11/2023    CREATININE 0.85 11/14/2023  ALBUMIN 4.7 07/11/2023    CALCIUM 9.3 11/14/2023    GLUCOSENF 84 07/11/2023    ALKPHOS 68 07/11/2023    ALT 23 07/11/2023    AST 32 07/11/2023    TOTBILIRUBIN 0.5 07/11/2023    TOTALPROTEIN 7.3 07/11/2023          BASIC METABOLIC PANEL  Lab Results   Component Value Date     SODIUM 140 07/11/2023    POTASSIUM 3.8 07/11/2023    CHLORIDE 103 07/11/2023    CO2 28 07/11/2023    ANIONGAP 9 07/11/2023    BUN 11 07/11/2023    CREATININE 0.85 11/14/2023    BUNCRRATIO 15 07/11/2023    GFR 76 11/14/2023    CALCIUM 9.3 11/14/2023    GLUCOSENF 84 07/11/2023           ASSESSMENT:  Problem List Items Addressed This Visit          Oncology    Breast cancer - Primary    Relevant Orders    Guardant 360 CDx        ICD-10-CM    1. Breast cancer  C50.919 Guardant 360 CDx           PLAN:   1. All relevant medical records were reviewed including available pertinent provider notes, procedure notes, imaging, laboratory, and pathology.   2. All pertinent labs and/or imaging were reviewed with the patient.   3. 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  ER positive, HER2 Neu negative.  She has developed a recurrence manifesting as letrozole  failure.  We discussed at this time that it would be logical to evaluate for an ESR1 mutation.  We will also switch letrozole  to exemestane and add Kisqali to her treatment regimen.  She will be undergoing radiation therapy to the humerus metastasis as well as the potential spine lesion.  We will continue to monitor laboratory studies to evaluate for treatment response going forward.    Barnet Lias was given the chance to ask questions, and these were answered to their satisfaction. The patient is welcome to call with any questions or concerns in the meantime.     On the day of the encounter, a total of 60 minutes was spent on this patient encounter including review of historical information, examination, documentation and post-visit activities.   Return in about 4 weeks (around 01/25/2024).     Layman Pries, MD  12/28/2023 , 10:07    CC:  Tresia Fruit Hill-Hurt, PA-C  [address]  [place] [address]    Verl Glatter, MD  [address]  [place],  [place] [address]    This note was partially generated  using MModal Fluency Direct system, and there may be some incorrect words, spellings, and punctuation that were not noted in checking the note before saving.

## 2023-12-28 NOTE — Nursing Note (Signed)
 Guardant mailed.

## 2023-12-29 ENCOUNTER — Encounter (INDEPENDENT_AMBULATORY_CARE_PROVIDER_SITE_OTHER): Payer: Self-pay | Admitting: HEMATOLOGY-ONCOLOGY

## 2023-12-29 ENCOUNTER — Other Ambulatory Visit: Payer: Self-pay

## 2023-12-29 ENCOUNTER — Encounter (HOSPITAL_COMMUNITY): Payer: Self-pay | Admitting: NURSE PRACTITIONER

## 2023-12-29 NOTE — Telephone Encounter (Signed)
 Prior authorization request for prescription Kisqali  received by [facility]ty Pharmacy on 12/29/2023.  Benefits investigation to be completed.

## 2023-12-29 NOTE — Telephone Encounter (Signed)
 Prior authorization for Kisqali  submitted electronically on 12/29/2023 through [facility] Portal. Key [id number]. Waiting for response from payor.    Juanetta Nordmann, Pharmacy Technician

## 2024-01-01 ENCOUNTER — Other Ambulatory Visit: Payer: Self-pay

## 2024-01-01 NOTE — Telephone Encounter (Signed)
 Called [facility] [phone]) and spoke with Lenon Radar - Obtained Cost Exceeds Maximum Over ride PA approval. Reprocessed and received paid claim. Case: [id number] Approved 12/02/23 - 12/31/24.

## 2024-01-01 NOTE — Telephone Encounter (Signed)
 Specialty Pharmacy Note    Prior Authorization for medication Kisqali  has been approved by payor  [place] from 11/29/23 until 12/28/24.  Approval notice has not been received but if needed can reach out to obtain. Per the Patient's Choice the prescription will be sent to [facility].     No further action is needed from clinic    Manufacturer Copay Card Applied to Patient's Copay resulting in $0 copay for 28 day supply.    If you have any questions, don't hesitate to contact the Specialty Pharmacy Juanetta Nordmann, Pharmacy Technician

## 2024-01-02 ENCOUNTER — Ambulatory Visit (HOSPITAL_COMMUNITY): Payer: Self-pay

## 2024-01-02 ENCOUNTER — Ambulatory Visit (HOSPITAL_BASED_OUTPATIENT_CLINIC_OR_DEPARTMENT_OTHER)
Admission: RE | Admit: 2024-01-02 | Discharge: 2024-01-02 | Disposition: A | Discharge status: Still In Hospital | Source: Ambulatory Visit | Attending: RADIATION ONCOLOGY

## 2024-01-02 ENCOUNTER — Other Ambulatory Visit: Payer: Self-pay

## 2024-01-02 ENCOUNTER — Other Ambulatory Visit (INDEPENDENT_AMBULATORY_CARE_PROVIDER_SITE_OTHER): Payer: Self-pay | Admitting: HEMATOLOGY-ONCOLOGY

## 2024-01-02 DIAGNOSIS — C7951 Secondary malignant neoplasm of bone: Secondary | ICD-10-CM

## 2024-01-02 DIAGNOSIS — C50919 Malignant neoplasm of unspecified site of unspecified female breast: Secondary | ICD-10-CM

## 2024-01-02 DIAGNOSIS — C7952 Secondary malignant neoplasm of bone marrow: Secondary | ICD-10-CM

## 2024-01-02 LAB — RAD ONC TREATMENT SUMMARY
Course Elapsed Days: 0
Plan Fractions Treated to Date: 1
Plan Fractions Treated to Date: 1
Plan Prescribed Dose Per Fraction: 300 cGy
Plan Prescribed Dose Per Fraction: 300 cGy
Plan Total Fractions Prescribed: 10
Plan Total Fractions Prescribed: 10
Plan Total Prescribed Dose: 3000 cGy
Plan Total Prescribed Dose: 3000 cGy
Reference Point Dosage Given to Date: 300 cGy
Reference Point Dosage Given to Date: 300 cGy
Reference Point Session Dosage Given: 300 cGy
Reference Point Session Dosage Given: 300 cGy

## 2024-01-03 ENCOUNTER — Ambulatory Visit (HOSPITAL_BASED_OUTPATIENT_CLINIC_OR_DEPARTMENT_OTHER)
Admission: RE | Admit: 2024-01-03 | Discharge: 2024-01-03 | Disposition: A | Discharge status: Home / Self Care | Source: Ambulatory Visit | Attending: HEMATOLOGY-ONCOLOGY | Admitting: HEMATOLOGY-ONCOLOGY

## 2024-01-03 ENCOUNTER — Encounter (INDEPENDENT_AMBULATORY_CARE_PROVIDER_SITE_OTHER): Payer: Self-pay | Admitting: HEMATOLOGY-ONCOLOGY

## 2024-01-03 ENCOUNTER — Other Ambulatory Visit: Payer: Self-pay | Admitting: Pharmacist

## 2024-01-03 ENCOUNTER — Ambulatory Visit (HOSPITAL_BASED_OUTPATIENT_CLINIC_OR_DEPARTMENT_OTHER)
Admission: RE | Admit: 2024-01-03 | Discharge: 2024-01-03 | Disposition: A | Discharge status: Still In Hospital | Source: Ambulatory Visit | Attending: RADIATION ONCOLOGY | Admitting: RADIATION ONCOLOGY

## 2024-01-03 ENCOUNTER — Other Ambulatory Visit: Payer: Self-pay

## 2024-01-03 ENCOUNTER — Ambulatory Visit
Admission: RE | Admit: 2024-01-03 | Discharge: 2024-01-03 | Disposition: A | Discharge status: Home / Self Care | Payer: Self-pay | Source: Ambulatory Visit | Attending: HEMATOLOGY-ONCOLOGY | Admitting: HEMATOLOGY-ONCOLOGY

## 2024-01-03 DIAGNOSIS — C7951 Secondary malignant neoplasm of bone: Secondary | ICD-10-CM

## 2024-01-03 DIAGNOSIS — C50919 Malignant neoplasm of unspecified site of unspecified female breast: Secondary | ICD-10-CM | POA: Insufficient documentation

## 2024-01-03 DIAGNOSIS — C7952 Secondary malignant neoplasm of bone marrow: Secondary | ICD-10-CM

## 2024-01-03 LAB — CBC WITH DIFF
BASOPHIL #: 0 10*3/uL (ref 0.00–0.10)
BASOPHIL %: 1 % (ref 0–1)
EOSINOPHIL #: 0.1 10*3/uL (ref 0.00–0.50)
EOSINOPHIL %: 3 % (ref 1–7)
HCT: 43.1 % — ABNORMAL HIGH (ref 31.2–41.9)
HGB: 14.7 g/dL — ABNORMAL HIGH (ref 10.9–14.3)
LYMPHOCYTE #: 0.9 10*3/uL — ABNORMAL LOW (ref 1.10–3.10)
LYMPHOCYTE %: 20 % (ref 16–46)
MCH: 33.8 pg — ABNORMAL HIGH (ref 24.7–32.8)
MCHC: 34.1 g/dL (ref 32.3–35.6)
MCV: 99.2 fL — ABNORMAL HIGH (ref 75.5–95.3)
MONOCYTE #: 0.4 10*3/uL (ref 0.20–0.90)
MONOCYTE %: 10 % (ref 4–11)
MPV: 9.6 fL (ref 7.9–10.8)
NEUTROPHIL #: 2.9 10*3/uL (ref 1.90–8.20)
NEUTROPHIL %: 67 % (ref 43–77)
PLATELETS: 172 10*3/uL (ref 140–440)
RBC: 4.35 10*6/uL (ref 3.63–4.92)
RDW: 12.9 % (ref 12.3–17.7)
WBC: 4.4 10*3/uL (ref 3.8–11.8)

## 2024-01-03 LAB — COMPREHENSIVE METABOLIC PANEL, NON-FASTING
ALBUMIN/GLOBULIN RATIO: 1.9 — ABNORMAL HIGH (ref 0.8–1.4)
ALBUMIN: 4.5 g/dL (ref 3.5–5.7)
ALKALINE PHOSPHATASE: 61 U/L (ref 34–104)
ALT (SGPT): 18 U/L (ref 7–52)
ANION GAP: 6 mmol/L (ref 4–13)
AST (SGOT): 28 U/L (ref 13–39)
BILIRUBIN TOTAL: 0.8 mg/dL (ref 0.3–1.0)
BUN/CREA RATIO: 12 (ref 6–22)
BUN: 9 mg/dL (ref 7–25)
CALCIUM, CORRECTED: 8.9 mg/dL (ref 8.9–10.8)
CALCIUM: 9.3 mg/dL (ref 8.6–10.3)
CHLORIDE: 104 mmol/L (ref 98–107)
CO2 TOTAL: 29 mmol/L (ref 21–31)
CREATININE: 0.73 mg/dL (ref 0.60–1.30)
ESTIMATED GFR: 91 mL/min/{1.73_m2} (ref >59)
GLOBULIN: 2.4 (ref 2.0–3.5)
GLUCOSE: 104 mg/dL (ref 74–109)
OSMOLALITY, CALCULATED: 277 mosm/kg (ref 270–290)
POTASSIUM: 4.5 mmol/L (ref 3.5–5.1)
PROTEIN TOTAL: 6.9 g/dL (ref 6.4–8.9)
SODIUM: 139 mmol/L (ref 136–145)

## 2024-01-03 LAB — RAD ONC TREATMENT SUMMARY
Course Elapsed Days: 1
Plan Fractions Treated to Date: 2
Plan Fractions Treated to Date: 2
Plan Prescribed Dose Per Fraction: 300 cGy
Plan Prescribed Dose Per Fraction: 300 cGy
Plan Total Fractions Prescribed: 10
Plan Total Fractions Prescribed: 10
Plan Total Prescribed Dose: 3000 cGy
Plan Total Prescribed Dose: 3000 cGy
Reference Point Dosage Given to Date: 600 cGy
Reference Point Dosage Given to Date: 600 cGy
Reference Point Session Dosage Given: 300 cGy
Reference Point Session Dosage Given: 300 cGy

## 2024-01-03 LAB — ECG 12 LEAD
Atrial Rate: 88 {beats}/min
Calculated P Axis: 61 degrees
Calculated R Axis: 69 degrees
Calculated T Axis: 66 degrees
PR Interval: 164 ms
QRS Duration: 78 ms
QT Interval: 384 ms
QTC Calculation: 464 ms
Ventricular rate: 88 {beats}/min

## 2024-01-03 LAB — GUARDANT360 CDX: MSI-HIGH: NOT DETECTED

## 2024-01-03 NOTE — Nursing Note (Signed)
 I called patient, she will come in today for EKG. I offered to have patient go to [place] and patient states she works in [place] and will stop by on her lunch break.

## 2024-01-03 NOTE — Telephone Encounter (Signed)
 Specialty Pharmacy Initial Note    Date of assessment: 01/03/2024  Patient: Melanie Snyder is a 68 y.o. female  Diagnosis: Metastatic Breast Cancer  Specialty Medication: Kisqali    Prescriber: Dr. Latisha Poland   Anticipated start date: TBD      Subjective:  Spoke with patient regarding initial education.   Disease state: Symptom report: None   denies recent hospitalizations/ER/urgent care visits in the past month related to diagnosis.    Reported goal includes: to tolerate therapy and prevent worsening disease progression.   Side effects: N/A  Adherence: N/A   Medication list reported changes: none     Objective:  Pertinent labs and monitoring parameters include:   CBC with diff  Lab Results   Component Value Date    WBC 4.4 01/03/2024    HGB 14.7 (H) 01/03/2024    PLTCNT 172 01/03/2024    PMNABS 2.90 01/03/2024    LYMPHSABS 0.90 (L) 01/03/2024    BMP and Renal Function  Lab Results   Component Value Date    SODIUM 139 01/03/2024    POTASSIUM 4.5 01/03/2024    CHLORIDE 104 01/03/2024    CALCIUM 9.3 01/03/2024    MAGNESIUM 2.2 11/14/2023    PHOSPHORUS 4.0 11/14/2023    BUN 9 01/03/2024    GLUCOSENF 104 01/03/2024    CREATININE 0.73 01/03/2024    GFR 91 01/03/2024      Hepatic Function  Lab Results   Component Value Date    AST 28 01/03/2024    ALT 18 01/03/2024    ALKPHOS 61 01/03/2024    TOTBILIRUBIN 0.8 01/03/2024    Other Monitoring  Qtcf 436 ms (01/03/24)  Labs under management of primary oncologist.         Assessment/Plan:  Disease state: medication appropriate with therapeutic goal of to prevent worsening disease progression. Reviewed medication purpose, administration, storage/handling, and when to contact the pharmacy, when to contact the clinic, when to seek appropriate care. Patientdeclined review of disease state. Reviewed with patient that radiation oncologist Dr. Jerona Mooring   Side effects:reviewed potential side effects such as: fatigue, diarrhea, cytopenias, infection/fever, elevated LFTs, arrhythmias,    Adherence: reminded of the importance of adherence and reviewed missed dose instructions.   Medication list: reviewed with Patient; up-to-date as of today  Notable drug-interactions: N/A  Scheduled delivery of kisqali  for Wednesday 5/28. Instructed Patient to notify clinic of delivery. Scheduled pharmacist follow up for next refill or sooner as needed.     [place] Glee Lashomb, [facility] 01/03/2024, 15:55

## 2024-01-03 NOTE — Nursing Note (Signed)
 Received a secure chat from Melanie Snyder, PharmD asking when patient was going to have EKG completed that was ordered yesterday. Called patient and patient reports that she had her labs done today but was unaware that she needed to have an EKG done. Patient reports she can have it done today, apologized for convenience.

## 2024-01-04 ENCOUNTER — Ambulatory Visit (HOSPITAL_BASED_OUTPATIENT_CLINIC_OR_DEPARTMENT_OTHER)
Admission: RE | Admit: 2024-01-04 | Discharge: 2024-01-04 | Disposition: A | Discharge status: Still In Hospital | Source: Ambulatory Visit | Attending: RADIATION ONCOLOGY | Admitting: RADIATION ONCOLOGY

## 2024-01-04 ENCOUNTER — Other Ambulatory Visit: Payer: Self-pay

## 2024-01-04 DIAGNOSIS — C7951 Secondary malignant neoplasm of bone: Secondary | ICD-10-CM

## 2024-01-04 DIAGNOSIS — C7952 Secondary malignant neoplasm of bone marrow: Secondary | ICD-10-CM

## 2024-01-04 LAB — RAD ONC TREATMENT SUMMARY
Course Elapsed Days: 2
Plan Fractions Treated to Date: 3
Plan Fractions Treated to Date: 3
Plan Prescribed Dose Per Fraction: 300 cGy
Plan Prescribed Dose Per Fraction: 300 cGy
Plan Total Fractions Prescribed: 10
Plan Total Fractions Prescribed: 10
Plan Total Prescribed Dose: 3000 cGy
Plan Total Prescribed Dose: 3000 cGy
Reference Point Dosage Given to Date: 900 cGy
Reference Point Dosage Given to Date: 900 cGy
Reference Point Session Dosage Given: 300 cGy
Reference Point Session Dosage Given: 300 cGy

## 2024-01-05 ENCOUNTER — Other Ambulatory Visit: Payer: Self-pay

## 2024-01-05 ENCOUNTER — Ambulatory Visit (HOSPITAL_BASED_OUTPATIENT_CLINIC_OR_DEPARTMENT_OTHER)
Admission: RE | Admit: 2024-01-05 | Discharge: 2024-01-05 | Disposition: A | Discharge status: Still In Hospital | Source: Ambulatory Visit | Attending: RADIATION ONCOLOGY | Admitting: RADIATION ONCOLOGY

## 2024-01-05 DIAGNOSIS — C7951 Secondary malignant neoplasm of bone: Secondary | ICD-10-CM

## 2024-01-05 DIAGNOSIS — C7952 Secondary malignant neoplasm of bone marrow: Secondary | ICD-10-CM

## 2024-01-05 LAB — RAD ONC TREATMENT SUMMARY
Course Elapsed Days: 3
Plan Fractions Treated to Date: 4
Plan Fractions Treated to Date: 4
Plan Prescribed Dose Per Fraction: 300 cGy
Plan Prescribed Dose Per Fraction: 300 cGy
Plan Total Fractions Prescribed: 10
Plan Total Fractions Prescribed: 10
Plan Total Prescribed Dose: 3000 cGy
Plan Total Prescribed Dose: 3000 cGy
Reference Point Dosage Given to Date: 1200 cGy
Reference Point Dosage Given to Date: 1200 cGy
Reference Point Session Dosage Given: 300 cGy
Reference Point Session Dosage Given: 300 cGy

## 2024-01-08 ENCOUNTER — Ambulatory Visit: Payer: Self-pay

## 2024-01-09 ENCOUNTER — Other Ambulatory Visit: Payer: Self-pay

## 2024-01-09 ENCOUNTER — Ambulatory Visit (HOSPITAL_BASED_OUTPATIENT_CLINIC_OR_DEPARTMENT_OTHER)
Admission: RE | Admit: 2024-01-09 | Discharge: 2024-01-09 | Disposition: A | Discharge status: Still In Hospital | Source: Ambulatory Visit | Attending: RADIATION ONCOLOGY | Admitting: RADIATION ONCOLOGY

## 2024-01-09 ENCOUNTER — Encounter (HOSPITAL_BASED_OUTPATIENT_CLINIC_OR_DEPARTMENT_OTHER): Payer: Self-pay | Admitting: RADIATION ONCOLOGY

## 2024-01-09 ENCOUNTER — Ambulatory Visit (INDEPENDENT_AMBULATORY_CARE_PROVIDER_SITE_OTHER): Payer: Self-pay | Admitting: NURSE PRACTITIONER

## 2024-01-09 DIAGNOSIS — C7951 Secondary malignant neoplasm of bone: Secondary | ICD-10-CM

## 2024-01-09 DIAGNOSIS — C7952 Secondary malignant neoplasm of bone marrow: Secondary | ICD-10-CM

## 2024-01-09 LAB — RAD ONC TREATMENT SUMMARY
Course Elapsed Days: 7
Plan Fractions Treated to Date: 5
Plan Fractions Treated to Date: 5
Plan Prescribed Dose Per Fraction: 300 cGy
Plan Prescribed Dose Per Fraction: 300 cGy
Plan Total Fractions Prescribed: 10
Plan Total Fractions Prescribed: 10
Plan Total Prescribed Dose: 3000 cGy
Plan Total Prescribed Dose: 3000 cGy
Reference Point Dosage Given to Date: 1500 cGy
Reference Point Dosage Given to Date: 1500 cGy
Reference Point Session Dosage Given: 300 cGy
Reference Point Session Dosage Given: 300 cGy

## 2024-01-10 ENCOUNTER — Ambulatory Visit (HOSPITAL_BASED_OUTPATIENT_CLINIC_OR_DEPARTMENT_OTHER)
Admission: RE | Admit: 2024-01-10 | Discharge: 2024-01-10 | Disposition: A | Discharge status: Still In Hospital | Source: Ambulatory Visit | Attending: RADIATION ONCOLOGY | Admitting: RADIATION ONCOLOGY

## 2024-01-10 DIAGNOSIS — C7951 Secondary malignant neoplasm of bone: Secondary | ICD-10-CM

## 2024-01-10 DIAGNOSIS — C7952 Secondary malignant neoplasm of bone marrow: Secondary | ICD-10-CM

## 2024-01-10 LAB — RAD ONC TREATMENT SUMMARY
Course Elapsed Days: 8
Plan Fractions Treated to Date: 6
Plan Fractions Treated to Date: 6
Plan Prescribed Dose Per Fraction: 300 cGy
Plan Prescribed Dose Per Fraction: 300 cGy
Plan Total Fractions Prescribed: 10
Plan Total Fractions Prescribed: 10
Plan Total Prescribed Dose: 3000 cGy
Plan Total Prescribed Dose: 3000 cGy
Reference Point Dosage Given to Date: 1800 cGy
Reference Point Dosage Given to Date: 1800 cGy
Reference Point Session Dosage Given: 300 cGy
Reference Point Session Dosage Given: 300 cGy

## 2024-01-11 ENCOUNTER — Ambulatory Visit (HOSPITAL_BASED_OUTPATIENT_CLINIC_OR_DEPARTMENT_OTHER)
Admission: RE | Admit: 2024-01-11 | Discharge: 2024-01-11 | Disposition: A | Discharge status: Still In Hospital | Source: Ambulatory Visit | Attending: RADIATION ONCOLOGY | Admitting: RADIATION ONCOLOGY

## 2024-01-11 ENCOUNTER — Other Ambulatory Visit: Payer: Self-pay

## 2024-01-11 ENCOUNTER — Encounter (INDEPENDENT_AMBULATORY_CARE_PROVIDER_SITE_OTHER): Payer: Self-pay | Admitting: HEMATOLOGY-ONCOLOGY

## 2024-01-11 ENCOUNTER — Encounter (HOSPITAL_COMMUNITY): Payer: Self-pay | Admitting: NURSE PRACTITIONER

## 2024-01-11 DIAGNOSIS — C7951 Secondary malignant neoplasm of bone: Secondary | ICD-10-CM

## 2024-01-11 DIAGNOSIS — C7952 Secondary malignant neoplasm of bone marrow: Secondary | ICD-10-CM

## 2024-01-11 LAB — RAD ONC TREATMENT SUMMARY
Course Elapsed Days: 9
Plan Fractions Treated to Date: 7
Plan Fractions Treated to Date: 7
Plan Prescribed Dose Per Fraction: 300 cGy
Plan Prescribed Dose Per Fraction: 300 cGy
Plan Total Fractions Prescribed: 10
Plan Total Fractions Prescribed: 10
Plan Total Prescribed Dose: 3000 cGy
Plan Total Prescribed Dose: 3000 cGy
Reference Point Dosage Given to Date: 2100 cGy
Reference Point Dosage Given to Date: 2100 cGy
Reference Point Session Dosage Given: 300 cGy
Reference Point Session Dosage Given: 300 cGy

## 2024-01-11 NOTE — Nursing Note (Signed)
 Received a message from Corbin Dess a nurse case manager with a medical health insurance company reporting that Ethelle Herb was working on a prior auth for this patient and she wants a return phone call at [phone] ext [phone]  to discuss.

## 2024-01-12 ENCOUNTER — Ambulatory Visit
Admission: RE | Admit: 2024-01-12 | Discharge: 2024-01-12 | Disposition: A | Discharge status: Still In Hospital | Source: Ambulatory Visit | Attending: RADIATION ONCOLOGY

## 2024-01-12 DIAGNOSIS — C7951 Secondary malignant neoplasm of bone: Secondary | ICD-10-CM

## 2024-01-12 DIAGNOSIS — C7952 Secondary malignant neoplasm of bone marrow: Secondary | ICD-10-CM

## 2024-01-12 LAB — RAD ONC TREATMENT SUMMARY
Course Elapsed Days: 10
Plan Fractions Treated to Date: 8
Plan Fractions Treated to Date: 8
Plan Prescribed Dose Per Fraction: 300 cGy
Plan Prescribed Dose Per Fraction: 300 cGy
Plan Total Fractions Prescribed: 10
Plan Total Fractions Prescribed: 10
Plan Total Prescribed Dose: 3000 cGy
Plan Total Prescribed Dose: 3000 cGy
Reference Point Dosage Given to Date: 2400 cGy
Reference Point Dosage Given to Date: 2400 cGy
Reference Point Session Dosage Given: 300 cGy
Reference Point Session Dosage Given: 300 cGy

## 2024-01-15 ENCOUNTER — Telehealth (INDEPENDENT_AMBULATORY_CARE_PROVIDER_SITE_OTHER): Payer: Self-pay | Admitting: HEMATOLOGY-ONCOLOGY

## 2024-01-15 ENCOUNTER — Ambulatory Visit (HOSPITAL_BASED_OUTPATIENT_CLINIC_OR_DEPARTMENT_OTHER)
Admission: RE | Admit: 2024-01-15 | Discharge: 2024-01-15 | Disposition: A | Discharge status: Still In Hospital | Source: Ambulatory Visit | Attending: RADIATION ONCOLOGY | Admitting: RADIATION ONCOLOGY

## 2024-01-15 ENCOUNTER — Ambulatory Visit (HOSPITAL_BASED_OUTPATIENT_CLINIC_OR_DEPARTMENT_OTHER)
Admission: RE | Admit: 2024-01-15 | Discharge: 2024-01-15 | Disposition: A | Discharge status: Still In Hospital | Source: Ambulatory Visit | Attending: RADIATION ONCOLOGY

## 2024-01-15 ENCOUNTER — Other Ambulatory Visit: Payer: Self-pay

## 2024-01-15 DIAGNOSIS — C7951 Secondary malignant neoplasm of bone: Secondary | ICD-10-CM

## 2024-01-15 DIAGNOSIS — C7952 Secondary malignant neoplasm of bone marrow: Secondary | ICD-10-CM | POA: Insufficient documentation

## 2024-01-15 DIAGNOSIS — Z510 Encounter for antineoplastic radiation therapy: Secondary | ICD-10-CM | POA: Insufficient documentation

## 2024-01-15 DIAGNOSIS — M545 Low back pain, unspecified: Secondary | ICD-10-CM | POA: Insufficient documentation

## 2024-01-15 LAB — RAD ONC TREATMENT SUMMARY
Course Elapsed Days: 13
Plan Fractions Treated to Date: 9
Plan Fractions Treated to Date: 9
Plan Prescribed Dose Per Fraction: 300 cGy
Plan Prescribed Dose Per Fraction: 300 cGy
Plan Total Fractions Prescribed: 10
Plan Total Fractions Prescribed: 10
Plan Total Prescribed Dose: 3000 cGy
Plan Total Prescribed Dose: 3000 cGy
Reference Point Dosage Given to Date: 2700 cGy
Reference Point Dosage Given to Date: 2700 cGy
Reference Point Session Dosage Given: 300 cGy
Reference Point Session Dosage Given: 300 cGy

## 2024-01-15 NOTE — Nursing Note (Signed)
 Patient called about burning in groin and hot flashes that are getting worse. Patient  states that the  burning last all day long. Talked to NP and was told that patient could hold armosin until appointment on 6-11. Patient states understanding.

## 2024-01-16 ENCOUNTER — Encounter (INDEPENDENT_AMBULATORY_CARE_PROVIDER_SITE_OTHER): Payer: Self-pay | Admitting: HEMATOLOGY-ONCOLOGY

## 2024-01-16 ENCOUNTER — Ambulatory Visit
Admission: RE | Admit: 2024-01-16 | Discharge: 2024-01-16 | Disposition: A | Discharge status: Still In Hospital | Source: Ambulatory Visit | Attending: RADIATION ONCOLOGY | Admitting: RADIATION ONCOLOGY

## 2024-01-16 ENCOUNTER — Inpatient Hospital Stay (HOSPITAL_BASED_OUTPATIENT_CLINIC_OR_DEPARTMENT_OTHER)
Admission: RE | Admit: 2024-01-16 | Discharge: 2024-01-16 | Disposition: A | Discharge status: Still In Hospital | Source: Ambulatory Visit | Attending: RADIATION ONCOLOGY

## 2024-01-16 DIAGNOSIS — C7951 Secondary malignant neoplasm of bone: Secondary | ICD-10-CM

## 2024-01-16 DIAGNOSIS — C7952 Secondary malignant neoplasm of bone marrow: Secondary | ICD-10-CM

## 2024-01-16 LAB — RAD ONC TREATMENT SUMMARY
Course Elapsed Days: 14
Plan Fractions Treated to Date: 10
Plan Fractions Treated to Date: 10
Plan Prescribed Dose Per Fraction: 300 cGy
Plan Prescribed Dose Per Fraction: 300 cGy
Plan Total Fractions Prescribed: 10
Plan Total Fractions Prescribed: 10
Plan Total Prescribed Dose: 3000 cGy
Plan Total Prescribed Dose: 3000 cGy
Reference Point Dosage Given to Date: 3000 cGy
Reference Point Dosage Given to Date: 3000 cGy

## 2024-01-17 ENCOUNTER — Other Ambulatory Visit: Payer: Self-pay

## 2024-01-17 ENCOUNTER — Other Ambulatory Visit: Payer: Self-pay | Admitting: Pharmacist

## 2024-01-17 ENCOUNTER — Encounter (INDEPENDENT_AMBULATORY_CARE_PROVIDER_SITE_OTHER): Payer: Self-pay | Admitting: HEMATOLOGY-ONCOLOGY

## 2024-01-17 NOTE — Telephone Encounter (Signed)
 Specialty Pharmacy Post-Initiation Assessment    Date of assessment: 01/17/2024  Patient: Melanie Snyder is a 68 y.o. female  Diagnosis: Metastatic breast cancer  Specialty Medication: Kisqali  600 mg daily for 21 days on and 7 days off. Repeat cycle  Prescriber: Dr. Latisha Poland   Initiation of therapy: 01/12/24     Subjective:  Spoke with patient for check in regarding the start of kisqali  therapy. Notes she held anastrozole three days ago due to groin burning. Patient notes that burning is improving, unclear if it is medication related vs. Radiation? Last radiation treatment 01/16/24.   Side effects:   Dyspepsia noted. Not taking supportive medications.   Denies N/V/D  Denies cardiac symptoms.   Adherence: Patient reports starting therapy on 01/12/24. Adherence: Denies any missed doses. Reports using routine as a method of adherence. Patient took last dose today and has 15 doses left on hand.  Medication list reported changes: none    Objective:  Pertinent labs include:  CBC with diff  Lab Results   Component Value Date    WBC 4.4 01/03/2024    HGB 14.7 (H) 01/03/2024    PLTCNT 172 01/03/2024    PMNABS 2.90 01/03/2024    LYMPHSABS 0.90 (L) 01/03/2024    BMP and Renal Function  Lab Results   Component Value Date    SODIUM 139 01/03/2024    POTASSIUM 4.5 01/03/2024    CHLORIDE 104 01/03/2024    CALCIUM 9.3 01/03/2024    MAGNESIUM 2.2 11/14/2023    PHOSPHORUS 4.0 11/14/2023    BUN 9 01/03/2024    GLUCOSENF 104 01/03/2024    CREATININE 0.73 01/03/2024    GFR 91 01/03/2024      Hepatic Function  Lab Results   Component Value Date    AST 28 01/03/2024    ALT 18 01/03/2024    ALKPHOS 61 01/03/2024    TOTBILIRUBIN 0.8 01/03/2024    Other Monitoring  Labs under management of primary oncologist.         Assessment/Plan:  Side effects: experiencing but tolerable provided mitigation strategies including Counseled on Tums, if heart burn continues can try famotidine. Patient willing to try these options. Encouraged patient to limit intake  of Coca Cola (acidic beverages can worsen dyspepsia).     Adherence: Patient report of medication on hand and fill records demonstrate adherence.   Medication list: reviewed with patient up-to-date as of today  Scheduled pharmacist follow up for next refill or sooner as needed.     [place] Dillyn Joaquin, [facility]  01/17/2024, 15:13

## 2024-01-18 ENCOUNTER — Encounter (INDEPENDENT_AMBULATORY_CARE_PROVIDER_SITE_OTHER): Payer: Self-pay | Admitting: HEMATOLOGY-ONCOLOGY

## 2024-01-18 ENCOUNTER — Other Ambulatory Visit (INDEPENDENT_AMBULATORY_CARE_PROVIDER_SITE_OTHER): Payer: Self-pay | Admitting: HEMATOLOGY-ONCOLOGY

## 2024-01-18 ENCOUNTER — Encounter (INDEPENDENT_AMBULATORY_CARE_PROVIDER_SITE_OTHER): Payer: Self-pay

## 2024-01-18 DIAGNOSIS — Z5181 Encounter for therapeutic drug level monitoring: Secondary | ICD-10-CM

## 2024-01-18 MED ORDER — FAMOTIDINE 20 MG TABLET
20.0000 mg | ORAL_TABLET | Freq: Every evening | ORAL | 2 refills | Status: DC
Start: 2024-01-18 — End: 2024-03-20

## 2024-01-18 MED ORDER — ONDANSETRON 8 MG DISINTEGRATING TABLET
8.0000 mg | ORAL_TABLET | Freq: Three times a day (TID) | ORAL | 2 refills | Status: DC | PRN
Start: 2024-01-18 — End: 2024-03-20

## 2024-01-18 NOTE — Nursing Note (Signed)
 Patient called and confirmed she started Kisqali  on 5/30, see oral chemotherapy flowsheet. She states she has been having "heartburn" and "mild nausea." Patient requests if Dr Latisha Poland sends in something over the counter for heartburn that it be sent as prescription so her insurance will assist with cost. I also discussed with patient that she will need to have an ecg completed on 6/11 during her next appointment, per [facility] pharmacy recommendation. Patient verbalized understanding. Discussed with Dr Latisha Poland and prescriptions for Zofran and Pepcid pended. Patient notified by My Chart.

## 2024-01-22 ENCOUNTER — Other Ambulatory Visit: Payer: Self-pay

## 2024-01-23 ENCOUNTER — Telehealth (INDEPENDENT_AMBULATORY_CARE_PROVIDER_SITE_OTHER): Payer: Self-pay | Admitting: HEMATOLOGY-ONCOLOGY

## 2024-01-23 ENCOUNTER — Encounter (INDEPENDENT_AMBULATORY_CARE_PROVIDER_SITE_OTHER): Payer: Self-pay | Admitting: HEMATOLOGY-ONCOLOGY

## 2024-01-23 NOTE — Nursing Note (Signed)
 Patient called and left message that she had been to [facility] and everything was negative.

## 2024-01-23 NOTE — Nursing Note (Addendum)
 Patient called stating she had a temperature of a 101.4 yesterday which is now down to under 100 with Tylenol, she is complaining of feeling achy with a sore throat. I asked her if she has been tested for COVID or flu and she said no that she was sure that it is from the Kisqali . She said she has an appointment with us  tomorrow, I told her she will need to have temperature less than 100 without any fever reducing medications for 24 hours to keep appointment, otherwise we will need to reschedule. She said she really needed to see Dr Latisha Poland tomorrow and is sure her symptoms are due to the medication. She said she can not get in to her PCP. Secure chat sent to Dr Latisha Poland and Loyce Ruffini to determine if testing needed to be done prior to appointment and if needs rescheduled.

## 2024-01-24 ENCOUNTER — Telehealth (INDEPENDENT_AMBULATORY_CARE_PROVIDER_SITE_OTHER): Payer: Self-pay | Admitting: HEMATOLOGY-ONCOLOGY

## 2024-01-24 ENCOUNTER — Encounter (INDEPENDENT_AMBULATORY_CARE_PROVIDER_SITE_OTHER): Payer: Self-pay | Admitting: HEMATOLOGY-ONCOLOGY

## 2024-01-24 ENCOUNTER — Ambulatory Visit (INDEPENDENT_AMBULATORY_CARE_PROVIDER_SITE_OTHER): Admission: RE | Admit: 2024-01-24 | Payer: Self-pay | Source: Ambulatory Visit

## 2024-01-24 ENCOUNTER — Other Ambulatory Visit: Payer: Self-pay

## 2024-01-24 ENCOUNTER — Ambulatory Visit: Payer: Self-pay | Attending: HEMATOLOGY-ONCOLOGY | Admitting: HEMATOLOGY-ONCOLOGY

## 2024-01-24 ENCOUNTER — Ambulatory Visit (HOSPITAL_COMMUNITY)
Admission: RE | Admit: 2024-01-24 | Discharge: 2024-01-24 | Disposition: A | Discharge status: Home / Self Care | Source: Ambulatory Visit | Attending: HEMATOLOGY-ONCOLOGY

## 2024-01-24 VITALS — BP 127/63 | HR 98 | Temp 98.7°F | Ht 63.0 in | Wt 213.3 lb

## 2024-01-24 DIAGNOSIS — Z5181 Encounter for therapeutic drug level monitoring: Secondary | ICD-10-CM

## 2024-01-24 DIAGNOSIS — Z9011 Acquired absence of right breast and nipple: Secondary | ICD-10-CM | POA: Insufficient documentation

## 2024-01-24 DIAGNOSIS — Z923 Personal history of irradiation: Secondary | ICD-10-CM | POA: Insufficient documentation

## 2024-01-24 DIAGNOSIS — Z1732 Human epidermal growth factor receptor 2 negative status: Secondary | ICD-10-CM | POA: Insufficient documentation

## 2024-01-24 DIAGNOSIS — C50911 Malignant neoplasm of unspecified site of right female breast: Secondary | ICD-10-CM

## 2024-01-24 DIAGNOSIS — Z9221 Personal history of antineoplastic chemotherapy: Secondary | ICD-10-CM | POA: Insufficient documentation

## 2024-01-24 DIAGNOSIS — C7951 Secondary malignant neoplasm of bone: Secondary | ICD-10-CM | POA: Insufficient documentation

## 2024-01-24 DIAGNOSIS — Z170 Estrogen receptor positive status [ER+]: Secondary | ICD-10-CM | POA: Insufficient documentation

## 2024-01-24 DIAGNOSIS — C50919 Malignant neoplasm of unspecified site of unspecified female breast: Secondary | ICD-10-CM | POA: Insufficient documentation

## 2024-01-24 NOTE — Nursing Note (Signed)
 Patient complaining of nausea. Patient had numerous side effects with armosin went over guardant test. Chemo consent signed, side effects explained and information given. All questions answered. Return n 1 month. Patient to have an EKG today.

## 2024-01-24 NOTE — Progress Notes (Unsigned)
 Department of Hematology/Oncology  Progress Note   Name: Melanie Snyder  RUE:[id number]  Date of Birth: 1956/04/12  Encounter Date: 01/24/2024    REFERRING PROVIDER:  Verl Glatter, MD  [address]  [place],  [place] [address]    REASON FOR OFFICE VISIT:  Breast Cancer     HISTORY OF PRESENT ILLNESS:  Melanie Snyder is a 68 y.o. female who presents today for assistance with managing metastatic breast cancer.    The patient has a history of infiltrating ductal carcinoma of the right breast.  She was stage III at the time of initial diagnosis.  T3 N2 M0, grade 2.  ER positive and HER2 Neu negative.  She underwent adjuvant therapy with Adriamycin and Cytoxan followed by paclitaxel.  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      She had adjuvant radiation therapy, which completed in January of 2021.  She has been on treatment with adjuvant letrozole  since January of 2021.  Late last year she was found to have circulating tumor DNA present.  This was followed by an imaging study which showed a left humerus metastatic deposit as well as a possible lesion in the lumbar spine.    01/24/2024: The patient is here for follow up of metastatic breast cancer.  She states that she tried taking the Aromasin , but ended up having issues with the burning sensation around her growing and waist area.  She states that it was terrible and has discontinued that medication.  She has been taking the Kisqali  without any problems.    ROS:   Pertinent review of systems as discussed in HPI    HISTORY:  Past Medical History:   Diagnosis Date    Breast CA     RIGHT CHEMO/RADIATION    Breast cancer 01/11/2022    Cardiac arrhythmia     Hyperlipidemia     Hypothyroidism     Malignant neoplasm of right female breast     Osteopenia          Past Surgical History:   Procedure Laterality Date    CESAREAN SECTION N/A     HX BREAST BIOPSY Right     6 months of chemo and radiation after  that    HX BREAST LUMPECTOMY Right     CHEMO/RADIATION    HX BREAST REDUCTION Left          Social History     Socioeconomic History    Marital status: Married     Spouse name: Not on file    Number of children: Not on file    Years of education: Not on file    Highest education level: Not on file   Occupational History    Not on file   Tobacco Use    Smoking status: Never    Smokeless tobacco: Never   Vaping Use    Vaping status: Never Used   Substance and Sexual Activity    Alcohol use: Never    Drug use: Never    Sexual activity: Not Currently   Other Topics Concern    Not on file   Social History Narrative    Not on file     Social Determinants of Health     Financial Resource Strain: Not on file   Transportation Needs: Not on file   Social Connections: Not on file   Intimate Partner Violence: Not on file   Housing Stability: Not on  file     Family Medical History:       Problem Relation (Age of Onset)    Cancer Mother, Maternal Grandfather    Coronary Artery Disease Paternal Aunt, Paternal Uncle    No Known Problems Father, Sister, Brother, Maternal Grandmother, Paternal Grandmother, Paternal Grandfather, Daughter, Son, Maternal Aunt, Maternal Uncle, Other            Current Outpatient Medications   Medication Sig    calcium carbonate/vitamin D3 (CALTRATE-600 PLUS VITAMIN D3 ORAL) Caltrate 600 plus D    cetirizine (ZYRTEC) 10 mg Oral Tablet Take 1 Tablet (10 mg total) by mouth Once per day as needed    exemestane  (AROMASIN ) 25 mg Oral Tablet Take 1 Tablet (25 mg total) by mouth Every morning after breakfast    famotidine  (PEPCID ) 20 mg Oral Tablet Take 1 Tablet (20 mg total) by mouth Every evening    levothyroxine (SYNTHROID) 112 mcg Oral Tablet     lidocaine -prilocaine (EMLA) 2.5-2.5 % Cream APPLY CREAM TOPICALLY TO AFFECTED AREA TWICE DAILY AS NEEDED    meloxicam (MOBIC) 15 mg Oral Tablet Take 1 Tablet (15 mg total) by mouth Daily    ondansetron  (ZOFRAN  ODT) 8 mg Oral Tablet, Rapid Dissolve Place 1 Tablet (8  mg total) under the tongue Every 8 hours as needed for Nausea/Vomiting    pravastatin (PRAVACHOL) 40 mg Oral Tablet Take 1 Tablet (40 mg total) by mouth    ribociclib  (KISQALI ) 600 mg/day (200 mg x 3) Oral Tablet Take 3 tablets (600 mg total) by mouth once daily for 21 days followed by 7 days off. Repeat every 28 days.     Allergies   Allergen Reactions    Amoxicillin      Other Reaction(s): Not available       PHYSICAL EXAM:  Most Recent Vitals    Flowsheet Row Office Visit from 12/28/2023 in Hematology/Oncology, [facility]   Temperature 36.8 C (98.2 F) filed at... 12/28/2023 0847   Heart Rate 105 filed at... 12/28/2023 0847   Respiratory Rate --   BP (Non-Invasive) 148/85 filed at... 12/28/2023 0847   SpO2 99 % filed at... 12/28/2023 0847   Height 1.6 m (5' 3) filed at... 12/28/2023 0847   Weight 96.8 kg (213 lb 6.4 oz) filed at... 12/28/2023 0847   BMI (Calculated) 37.88 filed at... 12/28/2023 0847   BSA (Calculated) 2.07 filed at... 12/28/2023 0847      ECOG Status: (0) Fully active, able to carry on all predisease performance without restriction   Physical Exam    DIAGNOSTIC DATA:  No results found for this or any previous visit (from the past [address] hours).    LABS:   CBC  Diff   Lab Results   Component Value Date/Time    WBC 4.4 01/03/2024 09:18 AM    HGB 14.7 (H) 01/03/2024 09:18 AM    HCT 43.1 (H) 01/03/2024 09:18 AM    PLTCNT 172 01/03/2024 09:18 AM    RBC 4.35 01/03/2024 09:18 AM    MCV 99.2 (H) 01/03/2024 09:18 AM    MCHC 34.1 01/03/2024 09:18 AM    MCH 33.8 (H) 01/03/2024 09:18 AM    RDW 12.9 01/03/2024 09:18 AM    MPV 9.6 01/03/2024 09:18 AM    Lab Results   Component Value Date/Time    PMNS 67 01/03/2024 09:18 AM    LYMPHOCYTES 20 01/03/2024 09:18 AM    EOSINOPHIL 3 01/03/2024 09:18 AM    MONOCYTES 10 01/03/2024 09:18  AM    BASOPHILS 1 01/03/2024 09:18 AM    BASOPHILS 0.00 01/03/2024 09:18 AM    PMNABS 2.90 01/03/2024 09:18 AM    LYMPHSABS 0.90 (L) 01/03/2024 09:18 AM    EOSABS 0.10 01/03/2024  09:18 AM    MONOSABS 0.40 01/03/2024 09:18 AM            Comprehensive Metabolic Profile    Lab Results   Component Value Date    SODIUM 139 01/03/2024    POTASSIUM 4.5 01/03/2024    CHLORIDE 104 01/03/2024    CO2 29 01/03/2024    ANIONGAP 6 01/03/2024    BUN 9 01/03/2024    CREATININE 0.73 01/03/2024    ALBUMIN 4.5 01/03/2024    CALCIUM 9.3 01/03/2024    GLUCOSENF 104 01/03/2024    ALKPHOS 61 01/03/2024    ALT 18 01/03/2024    AST 28 01/03/2024    TOTBILIRUBIN 0.8 01/03/2024    TOTALPROTEIN 6.9 01/03/2024          BASIC METABOLIC PANEL  Lab Results   Component Value Date    SODIUM 139 01/03/2024    POTASSIUM 4.5 01/03/2024    CHLORIDE 104 01/03/2024    CO2 29 01/03/2024    ANIONGAP 6 01/03/2024    BUN 9 01/03/2024    CREATININE 0.73 01/03/2024    BUNCRRATIO 12 01/03/2024    GFR 91 01/03/2024    CALCIUM 9.3 01/03/2024    GLUCOSENF 104 01/03/2024           ASSESSMENT:  Problem List Items Addressed This Visit          Oncology    Breast cancer - Primary    Relevant Orders    Referral to ONCOLOGY INFUSION - [facility] - (Chemo, Hydration, Injection, Infusion, Pheresis, Photopheresis))    COMPREHENSIVE METABOLIC PANEL, NON-FASTING    CBC    CA 27,29        ICD-10-CM    1. Breast cancer  C50.919 Referral to ONCOLOGY INFUSION - [facility] - (Chemo, Hydration, Injection, Infusion, Pheresis, Photopheresis))     COMPREHENSIVE METABOLIC PANEL, NON-FASTING     CBC     CA 27,29         TREATMENT HISTORY:  Letrozole   Exemestane /palbociclib (did not tolerate exemestane )  Fulvestrant/palbociclib (with plan to add Inavoisib in the near future)    PLAN:   1. All relevant medical records were reviewed including available pertinent provider notes, procedure notes, imaging, laboratory, and pathology.   2. All pertinent labs and/or imaging were reviewed with the patient.   3. 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  ER positive, HER2 Neu negative.  She has developed a recurrence  manifesting as letrozole  failure.  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  There are three different regimens which include fulvestrant which can be utilized.  Because she is already on Kisqali , we will add fulvestrant to that, and consider adding the third agent (Inavolisib) at a later date, potentially with an initial dose reduction.    Barnet Lias was given the chance to ask questions, and these were answered to their satisfaction. The patient is welcome to call with any questions or concerns in the meantime.     On the day of the encounter, a total of 35 minutes was spent on this patient encounter including review of historical information, examination, documentation and post-visit activities.  Return in about 4 weeks (around 02/21/2024).     Layman Pries, MD  01/24/2024 , 09:44    CC:  Tresia Fruit [place]-Hurt, PA-C  [address]  [place] [address]    Verl Glatter, MD  [address]  [place],  [place] [address]    This note was partially generated using MModal Fluency Direct system, and there may be some incorrect words, spellings, and punctuation that were not noted in checking the note before saving.

## 2024-01-25 ENCOUNTER — Encounter (INDEPENDENT_AMBULATORY_CARE_PROVIDER_SITE_OTHER): Payer: Self-pay | Admitting: HEMATOLOGY-ONCOLOGY

## 2024-01-26 ENCOUNTER — Encounter (INDEPENDENT_AMBULATORY_CARE_PROVIDER_SITE_OTHER): Payer: Self-pay | Admitting: HEMATOLOGY-ONCOLOGY

## 2024-01-29 ENCOUNTER — Other Ambulatory Visit: Payer: Self-pay

## 2024-01-29 ENCOUNTER — Encounter (HOSPITAL_COMMUNITY): Payer: Self-pay | Admitting: NURSE PRACTITIONER

## 2024-01-30 ENCOUNTER — Other Ambulatory Visit: Payer: Self-pay

## 2024-01-30 ENCOUNTER — Other Ambulatory Visit: Payer: Self-pay | Admitting: Pharmacist

## 2024-01-30 NOTE — Telephone Encounter (Unsigned)
 Specialty Pharmacy Follow Up Note    Date of assessment: 01/30/2024  Patient: Melanie Snyder is a 68 y.o. female  Diagnosis: Metastatic breast cancer  Specialty Medication: Ribociclib  600 mg daily for 21 days on and 7 days off. Repeat cycle.   Prescriber: Dr. Latisha Poland   Initiation of therapy: 01/12/24    Subjective:  Spoke with patient regarding kisqali . She attempted to complete an ECG on 01/24/24 however the result was not processed. Patient completed a repeat ECG on 01/31/24.   Disease state: Symptom report:   denies recent hospitalizations/ER/urgent care visits in the past month related to diagnosis.  Reported goal includes: tolerate therapy and prevent worsening disease progression.  Side effects:   Nausea, using prophylactic ondansetron  and as needed throughout the day. Taking ribociclib  with food.   Dyspepsia: Using Tums and famotidine .   Fatigue noted.   Adherence: Denies any missed doses. Reports using routine as a method of adherence.  Medication list reported changes: none    Objective:  Pertinent labs and monitoring parameters include:  CBC with diff  Lab Results   Component Value Date    WBC 4.4 01/03/2024    HGB 14.7 (H) 01/03/2024    PLTCNT 172 01/03/2024    PMNABS 2.90 01/03/2024    LYMPHSABS 0.90 (L) 01/03/2024    BMP and Renal Function  Lab Results   Component Value Date    SODIUM 139 01/03/2024    POTASSIUM 4.5 01/03/2024    CHLORIDE 104 01/03/2024    CALCIUM 9.3 01/03/2024    MAGNESIUM 2.2 11/14/2023    PHOSPHORUS 4.0 11/14/2023    BUN 9 01/03/2024    GLUCOSENF 104 01/03/2024    CREATININE 0.73 01/03/2024    GFR 91 01/03/2024      Hepatic Function  Lab Results   Component Value Date    AST 28 01/03/2024    ALT 18 01/03/2024    ALKPHOS 61 01/03/2024    TOTBILIRUBIN 0.8 01/03/2024    Other Monitoring  Qtcf 438 ms (01/31/24)  Labs under management of primary oncologist.         Assessment/Plan:  Disease state: Medication appropriate with therapeutic goal of preventing worsening disease progression.  Reviewed when to contact the pharmacy, when to contact the clinic, when to seek appropriate care. Patient declined review of medication purpose, administration, storage/handling, and disease state.  Side effects: experiencing but tolerable continue to monitor.   Nausea/Dyspepsia: Rph reviewed using a journal to log meals, goal to identify foods that worsen her symptoms. Reviewed BRAT diet and avoiding high processed foods, high fat, fried, and spicy foods. Reviewed use of supportive care medications.   Adherence: Patient report of medication on hand and fill records demonstrate adherence. Next planned cycle date: 6/27  Medication list reviewed with Patient; up-to-date as of today.  Notable drug-interactions: N/A  Scheduled delivery of Kisqali  for Tuesday 6/24. Scheduled pharmacist follow up for next refill or sooner as needed.    [place] Ronald Londo, [facility] 01/30/2024, 10:02

## 2024-01-31 ENCOUNTER — Other Ambulatory Visit: Payer: Self-pay

## 2024-01-31 ENCOUNTER — Encounter (INDEPENDENT_AMBULATORY_CARE_PROVIDER_SITE_OTHER): Payer: Self-pay | Admitting: HEMATOLOGY-ONCOLOGY

## 2024-01-31 ENCOUNTER — Ambulatory Visit
Admission: RE | Admit: 2024-01-31 | Discharge: 2024-01-31 | Disposition: A | Discharge status: Home / Self Care | Source: Ambulatory Visit | Attending: HEMATOLOGY-ONCOLOGY | Admitting: HEMATOLOGY-ONCOLOGY

## 2024-01-31 DIAGNOSIS — Z5181 Encounter for therapeutic drug level monitoring: Secondary | ICD-10-CM

## 2024-02-01 ENCOUNTER — Ambulatory Visit: Admitting: RADIATION ONCOLOGY

## 2024-02-01 ENCOUNTER — Encounter (HOSPITAL_COMMUNITY): Payer: Self-pay | Admitting: NURSE PRACTITIONER

## 2024-02-01 ENCOUNTER — Encounter (INDEPENDENT_AMBULATORY_CARE_PROVIDER_SITE_OTHER): Payer: Self-pay | Admitting: HEMATOLOGY-ONCOLOGY

## 2024-02-01 ENCOUNTER — Other Ambulatory Visit: Payer: Self-pay

## 2024-02-01 DIAGNOSIS — Z5181 Encounter for therapeutic drug level monitoring: Secondary | ICD-10-CM

## 2024-02-01 LAB — ECG 12 LEAD
Atrial Rate: 74 {beats}/min
Calculated P Axis: 55 degrees
Calculated R Axis: 74 degrees
Calculated T Axis: 70 degrees
PR Interval: 154 ms
QRS Duration: 78 ms
QT Interval: 408 ms
QTC Calculation: 452 ms
Ventricular rate: 74 {beats}/min

## 2024-02-05 ENCOUNTER — Other Ambulatory Visit: Payer: Self-pay

## 2024-02-15 ENCOUNTER — Encounter (INDEPENDENT_AMBULATORY_CARE_PROVIDER_SITE_OTHER): Payer: Self-pay | Admitting: HEMATOLOGY-ONCOLOGY

## 2024-02-19 ENCOUNTER — Encounter (INDEPENDENT_AMBULATORY_CARE_PROVIDER_SITE_OTHER): Payer: Self-pay | Admitting: HEMATOLOGY-ONCOLOGY

## 2024-02-19 ENCOUNTER — Other Ambulatory Visit (INDEPENDENT_AMBULATORY_CARE_PROVIDER_SITE_OTHER): Payer: Self-pay | Admitting: HEMATOLOGY-ONCOLOGY

## 2024-02-20 ENCOUNTER — Encounter (HOSPITAL_COMMUNITY): Payer: Self-pay | Admitting: NURSE PRACTITIONER

## 2024-02-20 ENCOUNTER — Encounter (INDEPENDENT_AMBULATORY_CARE_PROVIDER_SITE_OTHER): Payer: Self-pay | Admitting: HEMATOLOGY-ONCOLOGY

## 2024-02-21 ENCOUNTER — Encounter (INDEPENDENT_AMBULATORY_CARE_PROVIDER_SITE_OTHER): Payer: Self-pay | Admitting: HEMATOLOGY-ONCOLOGY

## 2024-02-21 ENCOUNTER — Ambulatory Visit (HOSPITAL_BASED_OUTPATIENT_CLINIC_OR_DEPARTMENT_OTHER)

## 2024-02-21 ENCOUNTER — Telehealth (INDEPENDENT_AMBULATORY_CARE_PROVIDER_SITE_OTHER): Payer: Self-pay | Admitting: HEMATOLOGY-ONCOLOGY

## 2024-02-21 ENCOUNTER — Ambulatory Visit (INDEPENDENT_AMBULATORY_CARE_PROVIDER_SITE_OTHER): Payer: Self-pay | Admitting: HEMATOLOGY-ONCOLOGY

## 2024-02-21 ENCOUNTER — Ambulatory Visit
Admission: RE | Admit: 2024-02-21 | Discharge: 2024-02-21 | Disposition: A | Discharge status: Home / Self Care | Payer: Self-pay | Source: Ambulatory Visit | Attending: HEMATOLOGY-ONCOLOGY | Admitting: HEMATOLOGY-ONCOLOGY

## 2024-02-21 ENCOUNTER — Other Ambulatory Visit: Payer: Self-pay

## 2024-02-21 ENCOUNTER — Ambulatory Visit (INDEPENDENT_AMBULATORY_CARE_PROVIDER_SITE_OTHER)
Admission: RE | Admit: 2024-02-21 | Discharge: 2024-02-21 | Disposition: A | Discharge status: Home / Self Care | Payer: Self-pay | Source: Ambulatory Visit | Attending: HEMATOLOGY-ONCOLOGY

## 2024-02-21 ENCOUNTER — Ambulatory Visit
Admission: RE | Admit: 2024-02-21 | Discharge: 2024-02-21 | Disposition: A | Discharge status: Still In Hospital | Source: Ambulatory Visit | Attending: RADIATION ONCOLOGY | Admitting: RADIATION ONCOLOGY

## 2024-02-21 VITALS — BP 137/95 | HR 88 | Temp 98.5°F | Resp 18

## 2024-02-21 VITALS — BP 131/86 | HR 94 | Temp 98.8°F | Ht 63.5 in | Wt 216.2 lb

## 2024-02-21 DIAGNOSIS — C50911 Malignant neoplasm of unspecified site of right female breast: Secondary | ICD-10-CM

## 2024-02-21 DIAGNOSIS — Z7969 Long term (current) use of other immunomodulators and immunosuppressants: Secondary | ICD-10-CM | POA: Insufficient documentation

## 2024-02-21 DIAGNOSIS — Z17411 Hormone receptor positive with human epidermal growth factor receptor 2 negative status: Secondary | ICD-10-CM | POA: Insufficient documentation

## 2024-02-21 DIAGNOSIS — C50919 Malignant neoplasm of unspecified site of unspecified female breast: Secondary | ICD-10-CM

## 2024-02-21 DIAGNOSIS — Z9011 Acquired absence of right breast and nipple: Secondary | ICD-10-CM | POA: Insufficient documentation

## 2024-02-21 DIAGNOSIS — Z79818 Long term (current) use of other agents affecting estrogen receptors and estrogen levels: Secondary | ICD-10-CM | POA: Insufficient documentation

## 2024-02-21 DIAGNOSIS — Z923 Personal history of irradiation: Secondary | ICD-10-CM | POA: Insufficient documentation

## 2024-02-21 DIAGNOSIS — M545 Low back pain, unspecified: Secondary | ICD-10-CM | POA: Insufficient documentation

## 2024-02-21 DIAGNOSIS — C7951 Secondary malignant neoplasm of bone: Secondary | ICD-10-CM | POA: Insufficient documentation

## 2024-02-21 DIAGNOSIS — C50411 Malignant neoplasm of upper-outer quadrant of right female breast: Secondary | ICD-10-CM | POA: Insufficient documentation

## 2024-02-21 DIAGNOSIS — Z853 Personal history of malignant neoplasm of breast: Secondary | ICD-10-CM | POA: Insufficient documentation

## 2024-02-21 DIAGNOSIS — Z5111 Encounter for antineoplastic chemotherapy: Secondary | ICD-10-CM | POA: Insufficient documentation

## 2024-02-21 DIAGNOSIS — Z9889 Other specified postprocedural states: Secondary | ICD-10-CM | POA: Insufficient documentation

## 2024-02-21 LAB — CBC
HCT: 35.8 % (ref 31.2–41.9)
HGB: 12.7 g/dL (ref 10.9–14.3)
MCH: 36.2 pg — ABNORMAL HIGH (ref 24.7–32.8)
MCHC: 35.5 g/dL (ref 32.3–35.6)
MCV: 102.1 fL — ABNORMAL HIGH (ref 75.5–95.3)
MPV: 8.6 fL (ref 7.9–10.8)
PLATELETS: 218 x10ˆ3/uL (ref 140–440)
RBC: 3.5 x10ˆ6/uL — ABNORMAL LOW (ref 3.63–4.92)
RDW: 18.2 % — ABNORMAL HIGH (ref 12.3–17.7)
WBC: 2.4 x10ˆ3/uL — ABNORMAL LOW (ref 3.8–11.8)

## 2024-02-21 LAB — COMPREHENSIVE METABOLIC PANEL, NON-FASTING
ALBUMIN/GLOBULIN RATIO: 2 — ABNORMAL HIGH (ref 0.8–1.4)
ALBUMIN: 4.6 g/dL (ref 3.5–5.7)
ALKALINE PHOSPHATASE: 62 U/L (ref 34–104)
ALT (SGPT): 92 U/L — ABNORMAL HIGH (ref 7–52)
ANION GAP: 6 mmol/L (ref 4–13)
AST (SGOT): 80 U/L — ABNORMAL HIGH (ref 13–39)
BILIRUBIN TOTAL: 0.7 mg/dL (ref 0.3–1.0)
BUN/CREA RATIO: 13 (ref 6–22)
BUN: 12 mg/dL (ref 7–25)
CALCIUM, CORRECTED: 8.8 mg/dL — ABNORMAL LOW (ref 8.9–10.8)
CALCIUM: 9.3 mg/dL (ref 8.6–10.3)
CHLORIDE: 105 mmol/L (ref 98–107)
CO2 TOTAL: 28 mmol/L (ref 21–31)
CREATININE: 0.96 mg/dL (ref 0.60–1.30)
ESTIMATED GFR: 66 mL/min/1.73mˆ2 (ref >59)
GLOBULIN: 2.3 (ref 2.0–3.5)
GLUCOSE: 113 mg/dL — ABNORMAL HIGH (ref 74–109)
OSMOLALITY, CALCULATED: 278 mosm/kg (ref 270–290)
POTASSIUM: 4.5 mmol/L (ref 3.5–5.1)
PROTEIN TOTAL: 6.9 g/dL (ref 6.4–8.9)
SODIUM: 139 mmol/L (ref 136–145)

## 2024-02-21 MED ORDER — HYDROCODONE 5 MG-ACETAMINOPHEN 325 MG TABLET
1.0000 | ORAL_TABLET | Freq: Four times a day (QID) | ORAL | 0 refills | Status: DC | PRN
Start: 2024-02-21 — End: 2024-05-09

## 2024-02-21 MED ORDER — FULVESTRANT 250 MG/5 ML INTRAMUSCULAR SYRINGE
500.0000 mg | INJECTION | Freq: Once | INTRAMUSCULAR | Status: AC
Start: 2024-02-21 — End: 2024-02-21
  Administered 2024-02-21: 500 mg via INTRAMUSCULAR
  Filled 2024-02-21: qty 10

## 2024-02-21 NOTE — Telephone Encounter (Signed)
 Today is first day of faslodex . Taking PO chemo with no complaints. Went over labs. All questions answered. Return in one month.

## 2024-02-21 NOTE — Progress Notes (Signed)
 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  Clinical Stage IIIA T3N2M0.  Now with recurrence noted on Signatera testing and MRI.    Assessment:  68 year old with recurrent breast cancer breast cancer.  Stable after completing palliative radiotherapy to 2 small areas now on new systemic therapy.      Recommendations:  We will repeat Signatera testing today.  I will add Norco for pain control for her low back pain.  We will see her back in 6 weeks and she is instructed to call in the interim should she have problems or concerns.    The indications, time course, benefits, risks and side effects of radiation treatment were explained to the patient, and her questions were answered to her apparent satisfaction. I encouraged her to contact us  at any time should she have any further questions or concerns. I personally saw and examined the patient, and reviewed all prior imaging and pathologic findings with her. I spent greater than 50% of a 30 minute visit in discussion of the patient's diagnosis and management.    FULL NOTE     Interval History :   Melanie Snyder is a 68 y.o. female with a history of right breast cancer.  She had neoadjuvant chemotherapy and lumpectomy followed by radiation treatments which were completed in January of 2021.  She returns after completing palliative radiotherapy to the lumbar spine and humerus.  She reports some continued low back pain.  She has seen Dr. Moses later today.  She has begun Kisquali in his changing her estrogen blocker.    Pain Assessment:  None    Past Medical/Surgical  History:  Past Medical History:   Diagnosis Date    Breast CA     RIGHT CHEMO/RADIATION    Breast cancer 01/11/2022    Cardiac arrhythmia     Hyperlipidemia     Hypothyroidism     Malignant neoplasm of right female breast     Osteopenia          Past Surgical History:   Procedure Laterality Date    CESAREAN SECTION N/A     HX BREAST BIOPSY Right     6 months of chemo and radiation after that    HX BREAST LUMPECTOMY Right     CHEMO/RADIATION    HX BREAST REDUCTION Left            Family History:   Family Medical History:       Problem Relation (Age of Onset)    Cancer Mother, Maternal Grandfather    Coronary Artery Disease Paternal Aunt, Paternal Uncle    No Known Problems Father, Sister, Brother, Maternal Grandmother, Paternal Grandmother, Paternal Grandfather, Daughter, Son, Maternal Aunt, Maternal Uncle, Other              Social History:   Social History     Socioeconomic History    Marital status: Married     Spouse name: Not on file    Number of children: Not on file    Years of education: Not on file  Highest education level: Not on file   Occupational History    Not on file   Tobacco Use    Smoking status: Never    Smokeless tobacco: Never   Vaping Use    Vaping status: Never Used   Substance and Sexual Activity    Alcohol use: Never    Drug use: Never    Sexual activity: Not Currently   Other Topics Concern    Not on file   Social History Narrative    Not on file     Social Determinants of Health     Financial Resource Strain: Not on file   Transportation Needs: Not on file   Social Connections: Not on file   Intimate Partner Violence: Not on file   Housing Stability: Not on file       ALLERGIES:   Allergies   Allergen Reactions    Amoxicillin      Other Reaction(s): Not available        MEDICATIONS:   Current Outpatient Medications   Medication Instructions    calcium carbonate/vitamin D3 (CALTRATE-600 PLUS VITAMIN D3 ORAL) Caltrate 600 plus D    cetirizine (ZYRTEC) 10 mg, DAILY PRN    exemestane   (AROMASIN ) 25 mg, Oral, EVERY MORNING AFTER BREAKFAST    famotidine  (PEPCID ) 20 mg, Oral, EVERY EVENING    HYDROcodone -acetaminophen  (NORCO) 5-325 mg Oral Tablet 1 Tablet, Oral, EVERY 6 HOURS PRN    levothyroxine (SYNTHROID) 112 mcg Oral Tablet     lidocaine -prilocaine (EMLA) 2.5-2.5 % Cream APPLY CREAM TOPICALLY TO AFFECTED AREA TWICE DAILY AS NEEDED    meloxicam (MOBIC) 15 mg, Daily    ondansetron  (ZOFRAN  ODT) 8 mg, Sublingual, EVERY 8 HOURS PRN    pravastatin (PRAVACHOL) 40 mg    ribociclib  (KISQALI ) 600 mg/day (200 mg x 3) Oral Tablet Take 3 tablets (600 mg total) by mouth once daily for 21 days followed by 7 days off. Repeat every 28 days.        REVIEW OF SYSTEMS  Pertinent review of systems as discussed in Interval History.      Objective:     Vitals:    02/21/24 0953   BP: (!) 167/77   Pulse: 97   Resp: 16   Temp: 36.3 C (97.4 F)   Weight: 103 kg (226 lb)             PHYSICAL EXAMINATION  Physical Exam  Constitutional:       Appearance: Normal appearance.   Eyes:      Extraocular Movements: Extraocular movements intact.      Pupils: Pupils are equal, round, and reactive to light.   Pulmonary:      Effort: Pulmonary effort is normal.   Musculoskeletal:         General: Normal range of motion.      Cervical back: Normal range of motion.   Neurological:      General: No focal deficit present.      Mental Status: She is alert and oriented to person, place, and time.   Psychiatric:         Mood and Affect: Mood normal.         Behavior: Behavior normal.          LABS/IMAGING: All relevant labs and imaging were reviewed as per HPI.      Fairy Patten, MD 02/21/2024, 10:11    rr:[id number]@

## 2024-02-21 NOTE — Nurses Notes (Signed)
 [id number]:  Arrival to unit today for initial faslodex  injections, patient is ambulatory and alone.  Medication information sheet given and patient is advised of both side effects and when to notify dr.  Lorilee signes noted and elevated blood pressure of 95 diastolic, patient states she is nervous.  Fulvestrant  500 mg IM given in 2 divided doses to each hip as ordered.  Patient tolerated injections well.  Left unit with next appt in 2 weeks.  Suzen Moose, RN

## 2024-02-21 NOTE — Progress Notes (Unsigned)
 Department of Hematology/Oncology  Progress Note   Name: Melanie Snyder  FMW:[id number]  Date of Birth: 1956/04/10  Encounter Date: 02/21/2024    REFERRING PROVIDER:  Steen Lenis, MD  [address]  [place],  [place] [address]    REASON FOR OFFICE VISIT:  Breast Cancer     HISTORY OF PRESENT ILLNESS:  Melanie Snyder is a 68 y.o. female who presents today for assistance with managing metastatic breast cancer.    The patient has a history of infiltrating ductal carcinoma of the right breast.  She was stage III at the time of initial diagnosis.  T3 N2 M0, grade 2.  ER positive and HER2 Neu negative.  She underwent adjuvant therapy with Adriamycin and Cytoxan followed by paclitaxel.  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      She had adjuvant radiation therapy, which completed in January of 2021.  She has been on treatment with adjuvant letrozole  since January of 2021.  Late last year she was found to have circulating tumor DNA present.  This was followed by an imaging study which showed a left humerus metastatic deposit as well as a possible lesion in the lumbar spine.    01/24/2024: The patient is here for follow up of metastatic breast cancer.  She states that she tried taking the Aromasin , but ended up having issues with the burning sensation around her growing and waist area.  She states that it was terrible and has discontinued that medication.  She has been taking the Kisqali  without any problems.    02/21/2024: The patient is here for follow up of metastatic breast cancer.  She is to begin treatment with Faslodex  and Kisqali  today.    ROS:   Pertinent review of systems as discussed in HPI    HISTORY:  Past Medical History:   Diagnosis Date    Breast CA     RIGHT CHEMO/RADIATION    Breast cancer 01/11/2022    Cardiac arrhythmia     Hyperlipidemia     Hypothyroidism     Malignant neoplasm of right female breast     Osteopenia          Past  Surgical History:   Procedure Laterality Date    CESAREAN SECTION N/A     HX BREAST BIOPSY Right     6 months of chemo and radiation after that    HX BREAST LUMPECTOMY Right     CHEMO/RADIATION    HX BREAST REDUCTION Left          Social History     Socioeconomic History    Marital status: Married     Spouse name: Not on file    Number of children: Not on file    Years of education: Not on file    Highest education level: Not on file   Occupational History    Not on file   Tobacco Use    Smoking status: Never    Smokeless tobacco: Never   Vaping Use    Vaping status: Never Used   Substance and Sexual Activity    Alcohol use: Never    Drug use: Never    Sexual activity: Not Currently   Other Topics Concern    Not on file   Social History Narrative    Not on file     Social Determinants of Health     Financial Resource Strain: Not on file  Transportation Needs: Not on file   Social Connections: Not on file   Intimate Partner Violence: Not on file   Housing Stability: Not on file     Family Medical History:       Problem Relation (Age of Onset)    Cancer Mother, Maternal Grandfather    Coronary Artery Disease Paternal Aunt, Paternal Uncle    No Known Problems Father, Sister, Brother, Maternal Grandmother, Paternal Grandmother, Paternal Grandfather, Daughter, Son, Maternal Aunt, Maternal Uncle, Other            Current Outpatient Medications   Medication Sig    calcium carbonate/vitamin D3 (CALTRATE-600 PLUS VITAMIN D3 ORAL) Caltrate 600 plus D    cetirizine (ZYRTEC) 10 mg Oral Tablet Take 1 Tablet (10 mg total) by mouth Once per day as needed    exemestane  (AROMASIN ) 25 mg Oral Tablet Take 1 Tablet (25 mg total) by mouth Every morning after breakfast    famotidine  (PEPCID ) 20 mg Oral Tablet Take 1 Tablet (20 mg total) by mouth Every evening    HYDROcodone -acetaminophen  (NORCO) 5-325 mg Oral Tablet Take 1 Tablet by mouth Every 6 hours as needed for Pain for up to 30 days    levothyroxine (SYNTHROID) 112 mcg Oral Tablet      lidocaine -prilocaine (EMLA) 2.5-2.5 % Cream APPLY CREAM TOPICALLY TO AFFECTED AREA TWICE DAILY AS NEEDED    meloxicam (MOBIC) 15 mg Oral Tablet Take 1 Tablet (15 mg total) by mouth Daily    ondansetron  (ZOFRAN  ODT) 8 mg Oral Tablet, Rapid Dissolve Place 1 Tablet (8 mg total) under the tongue Every 8 hours as needed for Nausea/Vomiting    pravastatin (PRAVACHOL) 40 mg Oral Tablet Take 1 Tablet (40 mg total) by mouth    ribociclib  (KISQALI ) 600 mg/day (200 mg x 3) Oral Tablet Take 3 tablets (600 mg total) by mouth once daily for 21 days followed by 7 days off. Repeat every 28 days.     Allergies   Allergen Reactions    Amoxicillin      Other Reaction(s): Not available       PHYSICAL EXAM:  Most Recent Vitals    Flowsheet Row Office Visit from 12/28/2023 in Hematology/Oncology, [facility]   Temperature 36.8 C (98.2 F) filed at... 12/28/2023 0847   Heart Rate 105 filed at... 12/28/2023 0847   Respiratory Rate --   BP (Non-Invasive) 148/85 filed at... 12/28/2023 0847   SpO2 99 % filed at... 12/28/2023 0847   Height 1.6 m (5' 3) filed at... 12/28/2023 0847   Weight 96.8 kg (213 lb 6.4 oz) filed at... 12/28/2023 0847   BMI (Calculated) 37.88 filed at... 12/28/2023 0847   BSA (Calculated) 2.07 filed at... 12/28/2023 0847      ECOG Status: (0) Fully active, able to carry on all predisease performance without restriction   Physical Exam    DIAGNOSTIC DATA:  No results found for this or any previous visit (from the past 82479 hours).    LABS:   CBC  Diff   Lab Results   Component Value Date/Time    WBC 2.4 (L) 02/21/2024 02:29 PM    HGB 12.7 02/21/2024 02:29 PM    HCT 35.8 02/21/2024 02:29 PM    PLTCNT 218 02/21/2024 02:29 PM    RBC 3.50 (L) 02/21/2024 02:29 PM    MCV 102.1 (H) 02/21/2024 02:29 PM    MCHC 35.5 02/21/2024 02:29 PM    MCH 36.2 (H) 02/21/2024 02:29 PM    RDW 18.2 (  H) 02/21/2024 02:29 PM    MPV 8.6 02/21/2024 02:29 PM    Lab Results   Component Value Date/Time    PMNS 67 01/03/2024 09:18 AM     LYMPHOCYTES 20 01/03/2024 09:18 AM    EOSINOPHIL 3 01/03/2024 09:18 AM    MONOCYTES 10 01/03/2024 09:18 AM    BASOPHILS 1 01/03/2024 09:18 AM    BASOPHILS 0.00 01/03/2024 09:18 AM    PMNABS 2.90 01/03/2024 09:18 AM    LYMPHSABS 0.90 (L) 01/03/2024 09:18 AM    EOSABS 0.10 01/03/2024 09:18 AM    MONOSABS 0.40 01/03/2024 09:18 AM            Comprehensive Metabolic Profile    Lab Results   Component Value Date    SODIUM 139 02/21/2024    POTASSIUM 4.5 02/21/2024    CHLORIDE 105 02/21/2024    CO2 28 02/21/2024    ANIONGAP 6 02/21/2024    BUN 12 02/21/2024    CREATININE 0.96 02/21/2024    ALBUMIN 4.6 02/21/2024    CALCIUM 9.3 02/21/2024    GLUCOSENF 113 (H) 02/21/2024    ALKPHOS 62 02/21/2024    ALT 92 (H) 02/21/2024    AST 80 (H) 02/21/2024    TOTBILIRUBIN 0.7 02/21/2024    TOTALPROTEIN 6.9 02/21/2024          BASIC METABOLIC PANEL  Lab Results   Component Value Date    SODIUM 139 02/21/2024    POTASSIUM 4.5 02/21/2024    CHLORIDE 105 02/21/2024    CO2 28 02/21/2024    ANIONGAP 6 02/21/2024    BUN 12 02/21/2024    CREATININE 0.96 02/21/2024    BUNCRRATIO 13 02/21/2024    GFR 66 02/21/2024    CALCIUM 9.3 02/21/2024    GLUCOSENF 113 (H) 02/21/2024           ASSESSMENT:  Problem List Items Addressed This Visit          Oncology    Breast cancer - Primary        ICD-10-CM    1. Breast cancer  C50.919          TREATMENT HISTORY:  Letrozole   Exemestane /palbociclib (did not tolerate exemestane )  Fulvestrant /palbociclib (with plan to add Inavoisib in the near future)    PLAN:   1. All relevant medical records were reviewed including available pertinent provider notes, procedure notes, imaging, laboratory, and pathology.   2. All pertinent labs and/or imaging were reviewed with the patient.   3. 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  ER positive, HER2 Neu negative.  She has developed a recurrence manifesting as letrozole  failure.  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  There are three different regimens which include fulvestrant  which can be utilized.  We can consider adding the third agent (Inavolisib) at a later date, potentially with an initial dose reduction.    Nathanel LITTIE Holts was given the chance to ask questions, and these were answered to their satisfaction. The patient is welcome to call with any questions or concerns in the meantime.     On the day of the encounter, a total of 35 minutes was spent on this patient encounter including review of historical information, examination, documentation and post-visit activities.   Return in about 4 weeks (around 03/20/2024).     Lynwood Pipes, MD  02/21/2024 , 15:21    CC:  Etta Caldron Oretta, PA-C  [address]  [place] [address]    Steen Lenis, MD  [address]  [place],  [place] [address]    This note was partially generated using MModal Fluency Direct system, and there may be some incorrect words, spellings, and punctuation that were not noted in checking the note before saving.

## 2024-02-23 ENCOUNTER — Other Ambulatory Visit: Payer: Self-pay

## 2024-02-23 LAB — CA 27,29: CA 27.29: 22 U/mL (ref <38)

## 2024-02-27 ENCOUNTER — Encounter (INDEPENDENT_AMBULATORY_CARE_PROVIDER_SITE_OTHER): Payer: Self-pay | Admitting: HEMATOLOGY-ONCOLOGY

## 2024-02-27 ENCOUNTER — Other Ambulatory Visit: Payer: Self-pay

## 2024-02-29 ENCOUNTER — Other Ambulatory Visit: Payer: Self-pay

## 2024-03-01 ENCOUNTER — Other Ambulatory Visit: Payer: Self-pay

## 2024-03-05 ENCOUNTER — Other Ambulatory Visit: Payer: Self-pay

## 2024-03-06 ENCOUNTER — Ambulatory Visit
Admission: RE | Admit: 2024-03-06 | Discharge: 2024-03-06 | Disposition: A | Discharge status: Home / Self Care | Payer: Self-pay | Source: Ambulatory Visit | Attending: HEMATOLOGY-ONCOLOGY | Admitting: HEMATOLOGY-ONCOLOGY

## 2024-03-06 ENCOUNTER — Other Ambulatory Visit: Payer: Self-pay

## 2024-03-06 VITALS — BP 133/78 | HR 108 | Temp 97.0°F | Resp 20

## 2024-03-06 DIAGNOSIS — C50919 Malignant neoplasm of unspecified site of unspecified female breast: Secondary | ICD-10-CM | POA: Insufficient documentation

## 2024-03-06 DIAGNOSIS — Z5111 Encounter for antineoplastic chemotherapy: Secondary | ICD-10-CM | POA: Insufficient documentation

## 2024-03-06 MED ORDER — FULVESTRANT 250 MG/5 ML INTRAMUSCULAR SYRINGE
500.0000 mg | INJECTION | Freq: Once | INTRAMUSCULAR | Status: AC
Start: 2024-03-06 — End: 2024-03-06
  Administered 2024-03-06: 500 mg via INTRAMUSCULAR
  Filled 2024-03-06: qty 10

## 2024-03-06 NOTE — Nurses Notes (Signed)
 1613-1714-Arrived to OP ONC ambulatory.  Here for Faslodex  injections.  No voiced complaints offered.  Fasalodex 250mg /38ml administered IM in left and right buttocks over 90 seconds each. Gauze with adhesive bandage applied to both injection sites.  Tolerated well.  Left OP ONC ambulatory.  No s/s of distress, VSS. Alan Borrow, RN

## 2024-03-18 ENCOUNTER — Other Ambulatory Visit (INDEPENDENT_AMBULATORY_CARE_PROVIDER_SITE_OTHER): Payer: Self-pay | Admitting: HEMATOLOGY-ONCOLOGY

## 2024-03-20 ENCOUNTER — Ambulatory Visit (INDEPENDENT_AMBULATORY_CARE_PROVIDER_SITE_OTHER): Payer: Self-pay | Admitting: HEMATOLOGY-ONCOLOGY

## 2024-03-20 ENCOUNTER — Ambulatory Visit (INDEPENDENT_AMBULATORY_CARE_PROVIDER_SITE_OTHER)
Admission: RE | Admit: 2024-03-20 | Discharge: 2024-03-20 | Disposition: A | Discharge status: Home / Self Care | Payer: Self-pay | Source: Ambulatory Visit | Attending: HEMATOLOGY-ONCOLOGY | Admitting: HEMATOLOGY-ONCOLOGY

## 2024-03-20 ENCOUNTER — Encounter (INDEPENDENT_AMBULATORY_CARE_PROVIDER_SITE_OTHER): Payer: Self-pay | Admitting: HEMATOLOGY-ONCOLOGY

## 2024-03-20 ENCOUNTER — Ambulatory Visit
Admission: RE | Admit: 2024-03-20 | Discharge: 2024-03-20 | Disposition: A | Discharge status: Home / Self Care | Payer: Self-pay | Source: Ambulatory Visit | Attending: HEMATOLOGY-ONCOLOGY | Admitting: HEMATOLOGY-ONCOLOGY

## 2024-03-20 ENCOUNTER — Other Ambulatory Visit (INDEPENDENT_AMBULATORY_CARE_PROVIDER_SITE_OTHER): Payer: Self-pay | Admitting: HEMATOLOGY-ONCOLOGY

## 2024-03-20 ENCOUNTER — Other Ambulatory Visit: Payer: Self-pay

## 2024-03-20 VITALS — BP 147/76 | HR 81 | Temp 97.6°F | Resp 18

## 2024-03-20 VITALS — BP 152/77 | HR 84 | Temp 98.0°F | Ht 63.0 in | Wt 215.7 lb

## 2024-03-20 DIAGNOSIS — C50919 Malignant neoplasm of unspecified site of unspecified female breast: Secondary | ICD-10-CM

## 2024-03-20 DIAGNOSIS — C7951 Secondary malignant neoplasm of bone: Secondary | ICD-10-CM | POA: Insufficient documentation

## 2024-03-20 DIAGNOSIS — Z1732 Human epidermal growth factor receptor 2 negative status: Secondary | ICD-10-CM | POA: Insufficient documentation

## 2024-03-20 DIAGNOSIS — Z79818 Long term (current) use of other agents affecting estrogen receptors and estrogen levels: Secondary | ICD-10-CM | POA: Insufficient documentation

## 2024-03-20 DIAGNOSIS — Z923 Personal history of irradiation: Secondary | ICD-10-CM | POA: Insufficient documentation

## 2024-03-20 DIAGNOSIS — Z170 Estrogen receptor positive status [ER+]: Secondary | ICD-10-CM | POA: Insufficient documentation

## 2024-03-20 DIAGNOSIS — C50911 Malignant neoplasm of unspecified site of right female breast: Secondary | ICD-10-CM | POA: Insufficient documentation

## 2024-03-20 DIAGNOSIS — Z9011 Acquired absence of right breast and nipple: Secondary | ICD-10-CM | POA: Insufficient documentation

## 2024-03-20 DIAGNOSIS — Z7969 Long term (current) use of other immunomodulators and immunosuppressants: Secondary | ICD-10-CM | POA: Insufficient documentation

## 2024-03-20 DIAGNOSIS — Z5111 Encounter for antineoplastic chemotherapy: Secondary | ICD-10-CM | POA: Insufficient documentation

## 2024-03-20 LAB — COMPREHENSIVE METABOLIC PANEL, NON-FASTING
ALBUMIN/GLOBULIN RATIO: 2.1 — ABNORMAL HIGH (ref 0.8–1.4)
ALBUMIN: 4.5 g/dL (ref 3.5–5.7)
ALKALINE PHOSPHATASE: 72 U/L (ref 34–104)
ALT (SGPT): 43 U/L (ref 7–52)
ANION GAP: 5 mmol/L (ref 4–13)
AST (SGOT): 50 U/L — ABNORMAL HIGH (ref 13–39)
BILIRUBIN TOTAL: 0.8 mg/dL (ref 0.3–1.0)
BUN/CREA RATIO: 15 (ref 6–22)
BUN: 13 mg/dL (ref 7–25)
CALCIUM, CORRECTED: 8.8 mg/dL — ABNORMAL LOW (ref 8.9–10.8)
CALCIUM: 9.2 mg/dL (ref 8.6–10.3)
CHLORIDE: 104 mmol/L (ref 98–107)
CO2 TOTAL: 28 mmol/L (ref 21–31)
CREATININE: 0.86 mg/dL (ref 0.60–1.30)
ESTIMATED GFR: 75 mL/min/1.73mˆ2 (ref >59)
GLOBULIN: 2.1 (ref 2.0–3.5)
GLUCOSE: 115 mg/dL — ABNORMAL HIGH (ref 74–109)
OSMOLALITY, CALCULATED: 275 mosm/kg (ref 270–290)
POTASSIUM: 4.3 mmol/L (ref 3.5–5.1)
PROTEIN TOTAL: 6.6 g/dL (ref 6.4–8.9)
SODIUM: 137 mmol/L (ref 136–145)

## 2024-03-20 LAB — CBC WITH DIFF
BASOPHIL #: 0 x10ˆ3/uL (ref 0.00–0.10)
BASOPHIL %: 1 % (ref 0–1)
EOSINOPHIL #: 0.1 x10ˆ3/uL (ref 0.00–0.50)
EOSINOPHIL %: 4 % (ref 1–7)
HCT: 36.2 % (ref 31.2–41.9)
HGB: 12.9 g/dL (ref 10.9–14.3)
LYMPHOCYTE #: 0.6 x10ˆ3/uL — ABNORMAL LOW (ref 1.10–3.10)
LYMPHOCYTE %: 28 % (ref 16–46)
MCH: 38.1 pg — ABNORMAL HIGH (ref 24.7–32.8)
MCHC: 35.7 g/dL — ABNORMAL HIGH (ref 32.3–35.6)
MCV: 106.8 fL — ABNORMAL HIGH (ref 75.5–95.3)
MONOCYTE #: 0.2 x10ˆ3/uL (ref 0.20–0.90)
MONOCYTE %: 10 % (ref 4–11)
MPV: 8.7 fL (ref 7.9–10.8)
NEUTROPHIL #: 1.2 x10ˆ3/uL — ABNORMAL LOW (ref 1.90–8.20)
NEUTROPHIL %: 57 % (ref 43–77)
PLATELETS: 200 x10ˆ3/uL (ref 140–440)
RBC: 3.39 x10ˆ6/uL — ABNORMAL LOW (ref 3.63–4.92)
RDW: 17.1 % (ref 12.3–17.7)
WBC: 2.1 x10ˆ3/uL — ABNORMAL LOW (ref 3.8–11.8)

## 2024-03-20 MED ORDER — ONDANSETRON 8 MG DISINTEGRATING TABLET
8.0000 mg | ORAL_TABLET | Freq: Three times a day (TID) | ORAL | 4 refills | Status: DC | PRN
Start: 2024-03-20 — End: 2024-05-20

## 2024-03-20 MED ORDER — FAMOTIDINE 20 MG TABLET
20.0000 mg | ORAL_TABLET | Freq: Every evening | ORAL | 4 refills | Status: DC
Start: 2024-03-20 — End: 2024-05-20

## 2024-03-20 MED ORDER — FULVESTRANT 250 MG/5 ML INTRAMUSCULAR SYRINGE
500.0000 mg | INJECTION | Freq: Once | INTRAMUSCULAR | Status: AC
Start: 2024-03-20 — End: 2024-03-20
  Administered 2024-03-20: 500 mg via INTRAMUSCULAR
  Filled 2024-03-20: qty 10

## 2024-03-20 NOTE — Nurses Notes (Signed)
 1550-1635-Arrived to OP ONC ambulatory.  Here for Faslodex  injections.  No voiced complaints offered.  Fasalodex 250mg /60ml administered IM in left and right buttocks over 90 seconds each. Gauze with adhesive bandage applied to both injection sites.  Tolerated well.  Left OP ONC ambulatory.  No s/s of distress, VSS. Alan Borrow, RN

## 2024-03-20 NOTE — Progress Notes (Signed)
 Department of Hematology/Oncology  Progress Note   Name: Melanie Snyder  FMW:[id number]  Date of Birth: [birth year]  Encounter Date: 03/20/2024    REFERRING PROVIDER:  Steen Lenis, MD  [address]  [place],  [place] [address]    REASON FOR OFFICE VISIT:  Breast Cancer (Follow up)     HISTORY OF PRESENT ILLNESS:  Melanie Snyder is a 68 y.o. female who presents today for assistance with managing metastatic breast cancer.    The patient has a history of infiltrating ductal carcinoma of the right breast.  She was stage III at the time of initial diagnosis.  T3 N2 M0, grade 2.  ER positive and HER2 Neu negative.  She underwent adjuvant therapy with Adriamycin and Cytoxan followed by paclitaxel.  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      She had adjuvant radiation therapy, which completed in January of 2021.  She has been on treatment with adjuvant letrozole  since January of 2021.  Late last year she was found to have circulating tumor DNA present.  This was followed by an imaging study which showed a left humerus metastatic deposit as well as a possible lesion in the lumbar spine.    01/24/2024: The patient is here for follow up of metastatic breast cancer.  She states that she tried taking the Aromasin , but ended up having issues with the burning sensation around her growing and waist area.  She states that it was terrible and has discontinued that medication.  She has been taking the Kisqali  without any problems.    02/21/2024: The patient is here for follow up of metastatic breast cancer.  She is to begin treatment with Faslodex  and Kisqali  today.    03/20/2024: The patient is here for follow up of metastatic breast cancer.  She has been on treatment with Faslodex  and Kisqali .  She states that she is tolerating treatment well, and her most recent Signatera test shows impressive improvement.    ROS:   Pertinent review of systems as discussed in  HPI    HISTORY:  Past Medical History:   Diagnosis Date    Breast CA     RIGHT CHEMO/RADIATION    Breast cancer 01/11/2022    Cardiac arrhythmia     Hyperlipidemia     Hypothyroidism     Malignant neoplasm of right female breast     Osteopenia          Past Surgical History:   Procedure Laterality Date    CESAREAN SECTION N/A     HX BREAST BIOPSY Right     6 months of chemo and radiation after that    HX BREAST LUMPECTOMY Right     CHEMO/RADIATION    HX BREAST REDUCTION Left          Social History     Socioeconomic History    Marital status: Married     Spouse name: Not on file    Number of children: Not on file    Years of education: Not on file    Highest education level: Not on file   Occupational History    Not on file   Tobacco Use    Smoking status: Never    Smokeless tobacco: Never   Vaping Use    Vaping status: Never Used   Substance and Sexual Activity    Alcohol use: Never    Drug use: Never    Sexual  activity: Not Currently   Other Topics Concern    Not on file   Social History Narrative    Not on file     Social Determinants of Health     Financial Resource Strain: Not on file   Transportation Needs: Not on file   Social Connections: Not on file   Intimate Partner Violence: Not on file   Housing Stability: Not on file     Family Medical History:       Problem Relation (Age of Onset)    Cancer Mother, Maternal Grandfather    Coronary Artery Disease Paternal Aunt, Paternal Uncle    No Known Problems Father, Sister, Brother, Maternal Grandmother, Paternal Grandmother, Paternal Grandfather, Daughter, Son, Maternal Aunt, Maternal Uncle, Other            Current Outpatient Medications   Medication Sig    calcium carbonate/vitamin D3 (CALTRATE-600 PLUS VITAMIN D3 ORAL) Caltrate 600 plus D    cetirizine (ZYRTEC) 10 mg Oral Tablet Take 1 Tablet (10 mg total) by mouth Once per day as needed    exemestane  (AROMASIN ) 25 mg Oral Tablet Take 1 Tablet (25 mg total) by mouth Every morning after breakfast    famotidine   (PEPCID ) 20 mg Oral Tablet Take 1 Tablet (20 mg total) by mouth Every evening    HYDROcodone -acetaminophen  (NORCO) 5-325 mg Oral Tablet Take 1 Tablet by mouth Every 6 hours as needed for Pain for up to 30 days    levothyroxine (SYNTHROID) 112 mcg Oral Tablet     lidocaine -prilocaine (EMLA) 2.5-2.5 % Cream APPLY CREAM TOPICALLY TO AFFECTED AREA TWICE DAILY AS NEEDED    meloxicam (MOBIC) 15 mg Oral Tablet Take 1 Tablet (15 mg total) by mouth Daily    ondansetron  (ZOFRAN  ODT) 8 mg Oral Tablet, Rapid Dissolve Place 1 Tablet (8 mg total) under the tongue Every 8 hours as needed for Nausea/Vomiting    pravastatin (PRAVACHOL) 40 mg Oral Tablet Take 1 Tablet (40 mg total) by mouth    ribociclib  (KISQALI ) 600 mg/day (200 mg x 3) Oral Tablet Take 3 tablets (600 mg total) by mouth once daily for 21 days followed by 7 days off. Repeat every 28 days.     Allergies   Allergen Reactions    Amoxicillin      Other Reaction(s): Not available       PHYSICAL EXAM:  Most Recent Vitals    Flowsheet Row Office Visit from 12/28/2023 in Hematology/Oncology, [facility]   Temperature 36.8 C (98.2 F) filed at... 12/28/2023 0847   Heart Rate 105 filed at... 12/28/2023 0847   Respiratory Rate --   BP (Non-Invasive) 148/85 filed at... 12/28/2023 0847   SpO2 99 % filed at... 12/28/2023 0847   Height 1.6 m (5' 3) filed at... 12/28/2023 0847   Weight 96.8 kg (213 lb 6.4 oz) filed at... 12/28/2023 0847   BMI (Calculated) 37.88 filed at... 12/28/2023 0847   BSA (Calculated) 2.07 filed at... 12/28/2023 0847      ECOG Status: (0) Fully active, able to carry on all predisease performance without restriction   Physical Exam    DIAGNOSTIC DATA:  No results found for this or any previous visit (from the past 82479 hours).    LABS:   CBC  Diff   Lab Results   Component Value Date/Time    WBC 2.1 (L) 03/20/2024 03:02 PM    HGB 12.9 03/20/2024 03:02 PM    HCT 36.2 03/20/2024 03:02 PM  PLTCNT 200 03/20/2024 03:02 PM    RBC 3.39 (L) 03/20/2024 03:02  PM    MCV 106.8 (H) 03/20/2024 03:02 PM    MCHC 35.7 (H) 03/20/2024 03:02 PM    MCH 38.1 (H) 03/20/2024 03:02 PM    RDW 17.1 03/20/2024 03:02 PM    MPV 8.7 03/20/2024 03:02 PM    Lab Results   Component Value Date/Time    PMNS 57 03/20/2024 03:02 PM    LYMPHOCYTES 28 03/20/2024 03:02 PM    EOSINOPHIL 4 03/20/2024 03:02 PM    MONOCYTES 10 03/20/2024 03:02 PM    BASOPHILS 1 03/20/2024 03:02 PM    BASOPHILS 0.00 03/20/2024 03:02 PM    PMNABS 1.20 (L) 03/20/2024 03:02 PM    LYMPHSABS 0.60 (L) 03/20/2024 03:02 PM    EOSABS 0.10 03/20/2024 03:02 PM    MONOSABS 0.20 03/20/2024 03:02 PM            Comprehensive Metabolic Profile    Lab Results   Component Value Date    SODIUM 137 03/20/2024    POTASSIUM 4.3 03/20/2024    CHLORIDE 104 03/20/2024    CO2 28 03/20/2024    ANIONGAP 5 03/20/2024    BUN 13 03/20/2024    CREATININE 0.86 03/20/2024    ALBUMIN 4.5 03/20/2024    CALCIUM 9.2 03/20/2024    GLUCOSENF 115 (H) 03/20/2024    ALKPHOS 72 03/20/2024    ALT 43 03/20/2024    AST 50 (H) 03/20/2024    TOTBILIRUBIN 0.8 03/20/2024    TOTALPROTEIN 6.6 03/20/2024          BASIC METABOLIC PANEL  Lab Results   Component Value Date    SODIUM 137 03/20/2024    POTASSIUM 4.3 03/20/2024    CHLORIDE 104 03/20/2024    CO2 28 03/20/2024    ANIONGAP 5 03/20/2024    BUN 13 03/20/2024    CREATININE 0.86 03/20/2024    BUNCRRATIO 15 03/20/2024    GFR 75 03/20/2024    CALCIUM 9.2 03/20/2024    GLUCOSENF 115 (H) 03/20/2024           ASSESSMENT:  Problem List Items Addressed This Visit          Oncology    Breast cancer - Primary    Relevant Orders    COMPREHENSIVE METABOLIC PANEL, NON-FASTING    CBC    CA 27,29        ICD-10-CM    1. Breast cancer  C50.919 COMPREHENSIVE METABOLIC PANEL, NON-FASTING     CBC     CA 27,29         TREATMENT HISTORY:  Letrozole   Exemestane /palbociclib (did not tolerate exemestane )  Fulvestrant /palbociclib (with plan to add Inavoisib in the near future)    PLAN:   1. All relevant medical records were reviewed including  available pertinent provider notes, procedure notes, imaging, laboratory, and pathology.   2. All pertinent labs and/or imaging were reviewed with the patient.   3. 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  ER positive, HER2 Neu negative.  She has developed a recurrence manifesting as letrozole  failure.  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  She is currently on treatment with Faslodex  and Kisqali , and is responding well.  We can consider adding the third agent (Inavolisib) at a later date, potentially with an initial dose reduction.    Nathanel LITTIE Holts was given  the chance to ask questions, and these were answered to their satisfaction. The patient is welcome to call with any questions or concerns in the meantime.     On the day of the encounter, a total of 35 minutes was spent on this patient encounter including review of historical information, examination, documentation and post-visit activities.   Return in about 2 months (around 05/20/2024).     Lynwood Pipes, MD  03/20/2024 , 16:28    CC:  Etta Caldron Hill-Hurt, PA-C  [address]  [place] [address]    Steen Lenis, MD  [address]  [place],  [place] [address]    This note was partially generated using MModal Fluency Direct system, and there may be some incorrect words, spellings, and punctuation that were not noted in checking the note before saving.

## 2024-03-20 NOTE — Nursing Note (Signed)
 Seen by Dr. Moses plan of care and lab results discussed. Patient states she is tolerating Kisqali  well with mainly side effect of fatigue. Follow up in 2 months.

## 2024-03-26 ENCOUNTER — Encounter (INDEPENDENT_AMBULATORY_CARE_PROVIDER_SITE_OTHER): Payer: Self-pay | Admitting: HEMATOLOGY-ONCOLOGY

## 2024-03-26 ENCOUNTER — Encounter (HOSPITAL_COMMUNITY): Payer: Self-pay | Admitting: NURSE PRACTITIONER

## 2024-03-26 ENCOUNTER — Other Ambulatory Visit: Payer: Self-pay

## 2024-03-27 ENCOUNTER — Other Ambulatory Visit: Payer: Self-pay

## 2024-03-29 ENCOUNTER — Other Ambulatory Visit: Payer: Self-pay

## 2024-04-03 ENCOUNTER — Other Ambulatory Visit: Payer: Self-pay

## 2024-04-03 ENCOUNTER — Ambulatory Visit (HOSPITAL_BASED_OUTPATIENT_CLINIC_OR_DEPARTMENT_OTHER): Admitting: RADIATION ONCOLOGY

## 2024-04-04 ENCOUNTER — Ambulatory Visit: Attending: RADIATION ONCOLOGY

## 2024-04-04 ENCOUNTER — Ambulatory Visit
Admission: RE | Admit: 2024-04-04 | Discharge: 2024-04-04 | Disposition: A | Discharge status: Still In Hospital | Source: Ambulatory Visit | Attending: RADIATION ONCOLOGY | Admitting: RADIATION ONCOLOGY

## 2024-04-04 ENCOUNTER — Other Ambulatory Visit: Payer: Self-pay

## 2024-04-04 DIAGNOSIS — Z9889 Other specified postprocedural states: Secondary | ICD-10-CM | POA: Insufficient documentation

## 2024-04-04 DIAGNOSIS — Z170 Estrogen receptor positive status [ER+]: Secondary | ICD-10-CM

## 2024-04-04 DIAGNOSIS — Z79811 Long term (current) use of aromatase inhibitors: Secondary | ICD-10-CM | POA: Insufficient documentation

## 2024-04-04 DIAGNOSIS — C7951 Secondary malignant neoplasm of bone: Secondary | ICD-10-CM | POA: Insufficient documentation

## 2024-04-04 DIAGNOSIS — Z853 Personal history of malignant neoplasm of breast: Secondary | ICD-10-CM | POA: Insufficient documentation

## 2024-04-04 DIAGNOSIS — C50911 Malignant neoplasm of unspecified site of right female breast: Secondary | ICD-10-CM | POA: Insufficient documentation

## 2024-04-04 DIAGNOSIS — Z9221 Personal history of antineoplastic chemotherapy: Secondary | ICD-10-CM | POA: Insufficient documentation

## 2024-04-04 DIAGNOSIS — Z9011 Acquired absence of right breast and nipple: Secondary | ICD-10-CM | POA: Insufficient documentation

## 2024-04-04 DIAGNOSIS — C50411 Malignant neoplasm of upper-outer quadrant of right female breast: Secondary | ICD-10-CM

## 2024-04-04 DIAGNOSIS — Z923 Personal history of irradiation: Secondary | ICD-10-CM | POA: Insufficient documentation

## 2024-04-04 NOTE — Progress Notes (Signed)
 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  Clinical Stage IIIA T3N2M0.  Now with recurrence noted on Signatera testing and MRI.    Assessment:  68 year old with recurrent breast cancer breast cancer.  Stable after completing palliative radiotherapy to 2 small areas.      Recommendations:  We will repeat Signatera testing today.  She has had good pain relief. We will see her back in 4 weeks and she is instructed to call in the interim should she have problems or concerns.    The indications, time course, benefits, risks and side effects of radiation treatment were explained to the patient, and her questions were answered to her apparent satisfaction. I encouraged her to contact us  at any time should she have any further questions or concerns. I personally saw and examined the patient, and reviewed all prior imaging and pathologic findings with her. I spent greater than 50% of a 30 minute visit in discussion of the patient's diagnosis and management.    FULL NOTE     Interval History :   Melanie Snyder is a 68 y.o. female with a history of right breast cancer.  She had neoadjuvant chemotherapy and lumpectomy followed by radiation treatments which were completed in January of 2021.  She returns after completing palliative radiotherapy to the lumbar spine and humerus.  She is tolerating Kisquali and Faslodex  with some mild aches and pains and some pain in her ears.    Pain Assessment:  None    Past Medical/Surgical History:  Past Medical History:   Diagnosis Date    Breast CA     RIGHT CHEMO/RADIATION    Breast cancer  01/11/2022    Cardiac arrhythmia     Hyperlipidemia     Hypothyroidism     Malignant neoplasm of right female breast     Osteopenia          Past Surgical History:   Procedure Laterality Date    CESAREAN SECTION N/A     HX BREAST BIOPSY Right     6 months of chemo and radiation after that    HX BREAST LUMPECTOMY Right     CHEMO/RADIATION    HX BREAST REDUCTION Left            Family History:   Family Medical History:       Problem Relation (Age of Onset)    Cancer Mother, Maternal Grandfather    Coronary Artery Disease Paternal Aunt, Paternal Uncle    No Known Problems Father, Sister, Brother, Maternal Grandmother, Paternal Grandmother, Paternal Grandfather, Daughter, Son, Maternal Aunt, Maternal Uncle, Other              Social History:   Social History     Socioeconomic History    Marital status: Married     Spouse name: Not on file    Number of children: Not on file    Years of education: Not on file    Highest education level: Not on file   Occupational History    Not on file   Tobacco  Use    Smoking status: Never    Smokeless tobacco: Never   Vaping Use    Vaping status: Never Used   Substance and Sexual Activity    Alcohol use: Never    Drug use: Never    Sexual activity: Not Currently   Other Topics Concern    Not on file   Social History Narrative    Not on file     Social Determinants of Health     Financial Resource Strain: Not on file   Transportation Needs: Not on file   Social Connections: Not on file   Intimate Partner Violence: Not on file   Housing Stability: Not on file       ALLERGIES:   Allergies   Allergen Reactions    Amoxicillin      Other Reaction(s): Not available        MEDICATIONS:   Current Outpatient Medications   Medication Instructions    calcium carbonate/vitamin D3 (CALTRATE-600 PLUS VITAMIN D3 ORAL) Caltrate 600 plus D    cetirizine (ZYRTEC) 10 mg, DAILY PRN    exemestane  (AROMASIN ) 25 mg, Oral, EVERY MORNING AFTER BREAKFAST    famotidine  (PEPCID ) 20 mg, Oral, EVERY EVENING     levothyroxine (SYNTHROID) 112 mcg Oral Tablet     lidocaine -prilocaine (EMLA) 2.5-2.5 % Cream APPLY CREAM TOPICALLY TO AFFECTED AREA TWICE DAILY AS NEEDED    meloxicam (MOBIC) 15 mg, Daily    ondansetron  (ZOFRAN  ODT) 8 mg, Sublingual, EVERY 8 HOURS PRN    pravastatin (PRAVACHOL) 40 mg    ribociclib  (KISQALI ) 600 mg/day (200 mg x 3) Oral Tablet Take 3 tablets (600 mg total) by mouth once daily for 21 days followed by 7 days off. Repeat every 28 days.        REVIEW OF SYSTEMS  Pertinent review of systems as discussed in Interval History.      Objective:     There were no vitals filed for this visit.            PHYSICAL EXAMINATION  Physical Exam  Constitutional:       Appearance: Normal appearance.   Eyes:      Extraocular Movements: Extraocular movements intact.      Pupils: Pupils are equal, round, and reactive to light.   Pulmonary:      Effort: Pulmonary effort is normal.   Musculoskeletal:         General: Normal range of motion.      Cervical back: Normal range of motion.   Neurological:      General: No focal deficit present.      Mental Status: She is alert and oriented to person, place, and time.   Psychiatric:         Mood and Affect: Mood normal.         Behavior: Behavior normal.          LABS/IMAGING: All relevant labs and imaging were reviewed as per HPI.      Fairy Patten, MD 04/04/2024, 10:04    rr:[id number]@

## 2024-04-16 ENCOUNTER — Other Ambulatory Visit (INDEPENDENT_AMBULATORY_CARE_PROVIDER_SITE_OTHER): Payer: Self-pay | Admitting: HEMATOLOGY-ONCOLOGY

## 2024-04-16 ENCOUNTER — Encounter (HOSPITAL_COMMUNITY): Payer: Self-pay | Admitting: NURSE PRACTITIONER

## 2024-04-17 ENCOUNTER — Ambulatory Visit
Admission: RE | Admit: 2024-04-17 | Discharge: 2024-04-17 | Disposition: A | Discharge status: Home / Self Care | Payer: Self-pay | Source: Ambulatory Visit | Attending: HEMATOLOGY-ONCOLOGY | Admitting: HEMATOLOGY-ONCOLOGY

## 2024-04-17 ENCOUNTER — Encounter (HOSPITAL_COMMUNITY): Payer: Self-pay | Admitting: NURSE PRACTITIONER

## 2024-04-17 ENCOUNTER — Other Ambulatory Visit (HOSPITAL_BASED_OUTPATIENT_CLINIC_OR_DEPARTMENT_OTHER): Payer: Self-pay | Admitting: RADIATION ONCOLOGY

## 2024-04-17 ENCOUNTER — Other Ambulatory Visit: Payer: Self-pay

## 2024-04-17 VITALS — BP 141/72 | HR 96 | Temp 97.7°F | Resp 18

## 2024-04-17 DIAGNOSIS — C50919 Malignant neoplasm of unspecified site of unspecified female breast: Secondary | ICD-10-CM | POA: Insufficient documentation

## 2024-04-17 DIAGNOSIS — Z5111 Encounter for antineoplastic chemotherapy: Secondary | ICD-10-CM | POA: Insufficient documentation

## 2024-04-17 DIAGNOSIS — C50911 Malignant neoplasm of unspecified site of right female breast: Secondary | ICD-10-CM

## 2024-04-17 MED ORDER — FULVESTRANT 250 MG/5 ML INTRAMUSCULAR SYRINGE
500.0000 mg | INJECTION | Freq: Once | INTRAMUSCULAR | Status: AC
Start: 2024-04-17 — End: 2024-04-17
  Administered 2024-04-17: 500 mg via INTRAMUSCULAR
  Filled 2024-04-17: qty 10

## 2024-04-17 NOTE — Nurses Notes (Signed)
 [id number]-Arrived to OP ONC ambulatory.  Here for Faslodex  injections.  No voiced complaints offered.  Fasalodex 250mg /60ml administered IM in left and right buttocks over 90 seconds each. Gauze with adhesive bandage applied to both injection sites.  Tolerated well.  Left OP ONC ambulatory.  No s/s of distress, VSS. Alan Borrow, RN

## 2024-04-26 ENCOUNTER — Other Ambulatory Visit: Payer: Self-pay

## 2024-04-26 NOTE — Telephone Encounter (Signed)
 Specialty Pharmacy Follow Up Note    Date of assessment: 04/26/2024  Patient: Melanie Snyder is a 68 y.o. female  Diagnosis: metastatic breast cancer  Specialty Medication: Kisqali  600 mg daily for 21 days on and 7 days off. Repeat cycle  Prescriber: Dr. Moses  Initiation of therapy: 01/12/24    Subjective:  Spoke with patient regarding Kisqali .   Disease state: Symptom report: none  denies recent hospitalizations/ER/urgent care visits in the past month related to diagnosis.  Reported goal includes: tolerate therapy and prevent worsening disease progression  Side effects:   Nausea: tolerable with PRN ondansetron   Heartburn: using famotidine  with decent control  Fatigue: improved but still manageable   Adherence: Denies any missed doses. Reports using routine as a method of adherence.  Medication list reported changes: none    Objective:  Pertinent labs and monitoring parameters include:  CBC with diff  Lab Results   Component Value Date    WBC 2.1 (L) 03/20/2024    HGB 12.9 03/20/2024    PLTCNT 200 03/20/2024    PMNABS 1.20 (L) 03/20/2024    LYMPHSABS 0.60 (L) 03/20/2024    BMP and Renal Function  Lab Results   Component Value Date    SODIUM 137 03/20/2024    POTASSIUM 4.3 03/20/2024    CHLORIDE 104 03/20/2024    CALCIUM 9.2 03/20/2024    MAGNESIUM 2.2 11/14/2023    PHOSPHORUS 4.0 11/14/2023    BUN 13 03/20/2024    GLUCOSENF 115 (H) 03/20/2024    CREATININE 0.86 03/20/2024    GFR 75 03/20/2024      Hepatic Function  Lab Results   Component Value Date    AST 50 (H) 03/20/2024    ALT 43 03/20/2024    ALKPHOS 72 03/20/2024    TOTBILIRUBIN 0.8 03/20/2024    Other Monitoring  QTcF 01/31/24: 438       Assessment/Plan:  Disease state: Medication appropriate with therapeutic goal of Prevention of Progression. Reviewed medication purpose, administration, storage/handling, when to contact the pharmacy, when to contact the clinic, when to seek appropriate care, and disease state  Side effects: experiencing but tolerable  continue to monitor  Adherence: Patient report of medication on hand and fill records demonstrate adherence.  Reminded of the importance of adherence and reviewed missed dose instructions   Medication list reviewed with Patient; up-to-date as of today.  Notable drug-interactions: none  Scheduled delivery of Kisqali  for 9/17. Scheduled pharmacist follow up for 3 months or sooner as needed.      Carigan Lister, [facility] 04/26/2024, 16:18

## 2024-04-29 ENCOUNTER — Other Ambulatory Visit: Payer: Self-pay

## 2024-04-30 ENCOUNTER — Other Ambulatory Visit: Payer: Self-pay

## 2024-05-09 ENCOUNTER — Other Ambulatory Visit (HOSPITAL_BASED_OUTPATIENT_CLINIC_OR_DEPARTMENT_OTHER): Payer: Self-pay | Admitting: RADIATION ONCOLOGY

## 2024-05-09 ENCOUNTER — Ambulatory Visit

## 2024-05-09 ENCOUNTER — Other Ambulatory Visit: Payer: Self-pay

## 2024-05-09 ENCOUNTER — Ambulatory Visit
Admission: RE | Admit: 2024-05-09 | Discharge: 2024-05-09 | Disposition: A | Discharge status: Still In Hospital | Source: Ambulatory Visit | Attending: RADIATION ONCOLOGY | Admitting: RADIATION ONCOLOGY

## 2024-05-09 DIAGNOSIS — Z853 Personal history of malignant neoplasm of breast: Secondary | ICD-10-CM | POA: Insufficient documentation

## 2024-05-09 DIAGNOSIS — Z170 Estrogen receptor positive status [ER+]: Secondary | ICD-10-CM

## 2024-05-09 DIAGNOSIS — K769 Liver disease, unspecified: Secondary | ICD-10-CM

## 2024-05-09 DIAGNOSIS — Z923 Personal history of irradiation: Secondary | ICD-10-CM | POA: Insufficient documentation

## 2024-05-09 DIAGNOSIS — Z7969 Long term (current) use of other immunomodulators and immunosuppressants: Secondary | ICD-10-CM | POA: Insufficient documentation

## 2024-05-09 DIAGNOSIS — Z79811 Long term (current) use of aromatase inhibitors: Secondary | ICD-10-CM | POA: Insufficient documentation

## 2024-05-09 DIAGNOSIS — Z9889 Other specified postprocedural states: Secondary | ICD-10-CM | POA: Insufficient documentation

## 2024-05-09 DIAGNOSIS — C7951 Secondary malignant neoplasm of bone: Secondary | ICD-10-CM | POA: Insufficient documentation

## 2024-05-09 DIAGNOSIS — Z9221 Personal history of antineoplastic chemotherapy: Secondary | ICD-10-CM | POA: Insufficient documentation

## 2024-05-09 DIAGNOSIS — Z9011 Acquired absence of right breast and nipple: Secondary | ICD-10-CM | POA: Insufficient documentation

## 2024-05-09 DIAGNOSIS — C50411 Malignant neoplasm of upper-outer quadrant of right female breast: Secondary | ICD-10-CM

## 2024-05-09 DIAGNOSIS — C50911 Malignant neoplasm of unspecified site of right female breast: Secondary | ICD-10-CM | POA: Insufficient documentation

## 2024-05-09 DIAGNOSIS — C7952 Secondary malignant neoplasm of bone marrow: Secondary | ICD-10-CM | POA: Insufficient documentation

## 2024-05-09 MED ORDER — HYDROCODONE 5 MG-ACETAMINOPHEN 325 MG TABLET: 1.0000 | ORAL_TABLET | Freq: Four times a day (QID) | ORAL | 0 refills | Status: DC | PRN

## 2024-05-09 NOTE — Progress Notes (Signed)
 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  Clinical Stage IIIA T3N2M0.  Now with recurrence noted on Signatera testing and MRI.      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    Oncology disease status: The patient has no clinical or biochemical evidence of cancer at this visit or Recurrent cancer has been confirmed.    Recommendations:  I have recommended three-phase contrasted CT of the liver to evaluate lesions noted on PET-CT scan.  We will see her back afterwards to go over the results.  I will continue her pain medication.  She is instructed to call in the interim should she have problems or concerns.    The indications, time course, benefits, risks and side effects of radiation treatment were explained to the patient, and her questions were answered to her apparent satisfaction. I encouraged her to contact us  at any time should she have any further questions or concerns. I personally saw and examined the patient, and reviewed all prior imaging and pathologic findings with her. On the day of the encounter, a total of 30 minutes was spent on this patient encounter including review of historical data, examination, documentation and post-visit activities.  This time documented excludes procedural time.   FULL NOTE     Interval History :   Melanie Snyder is a 68 y.o. female with a history of breast cancer.  She has had recurrence in his on Kisquali at this time tolerating it reasonably well.  She has had  palliative radiotherapy to the left humerus.  She continues to have some pain in his improved with Norco.  Recent Signatera showed a increased value.  PET-CT was performed and personally reviewed and shows 3 worrisome areas in the liver though the report does not mention them.    Pain Assessment:  Continued occasional pain    Past Medical/Surgical History:  Past Medical History:   Diagnosis Date    Breast CA     RIGHT CHEMO/RADIATION    Breast cancer 01/11/2022    Cardiac arrhythmia     Hyperlipidemia     Hypothyroidism     Malignant neoplasm of right female breast     Osteopenia          Past Surgical History:   Procedure Laterality Date    CESAREAN SECTION N/A     HX BREAST BIOPSY Right     6 months of chemo and radiation after that    HX BREAST LUMPECTOMY Right     CHEMO/RADIATION    HX BREAST REDUCTION Left            Family History:   Family Medical History:       Problem Relation (Age of Onset)    Cancer Mother, Maternal Grandfather    Coronary Artery Disease Paternal Aunt, Paternal Uncle    No Known Problems Father, Sister, Brother, Maternal Grandmother, Paternal Grandmother, Paternal Grandfather, Daughter, Son, Maternal Aunt, Maternal Uncle, Other  Social History:   Social History     Socioeconomic History    Marital status: Married     Spouse name: Not on file    Number of children: Not on file    Years of education: Not on file    Highest education level: Not on file   Occupational History    Not on file   Tobacco Use    Smoking status: Never    Smokeless tobacco: Never   Vaping Use    Vaping status: Never Used   Substance and Sexual Activity    Alcohol use: Never    Drug use: Never    Sexual activity: Not Currently   Other Topics Concern    Not on file   Social History Narrative    Not on file     Social Determinants of Health     Financial Resource Strain: Not on file   Transportation Needs: Not on file   Social Connections: Not on file   Intimate Partner Violence: Not on file   Housing  Stability: Not on file       ALLERGIES:   Allergies[1]     MEDICATIONS:   Current Outpatient Medications   Medication Instructions    calcium carbonate/vitamin D3 (CALTRATE-600 PLUS VITAMIN D3 ORAL) Caltrate 600 plus D    cetirizine (ZYRTEC) 10 mg, DAILY PRN    famotidine  (PEPCID ) 20 mg, Oral, EVERY EVENING    HYDROcodone -acetaminophen  (NORCO) 5-325 mg Oral Tablet 1 Tablet, Oral, EVERY 6 HOURS PRN    levothyroxine (SYNTHROID) 112 mcg Oral Tablet     lidocaine -prilocaine (EMLA) 2.5-2.5 % Cream APPLY CREAM TOPICALLY TO AFFECTED AREA TWICE DAILY AS NEEDED    meloxicam (MOBIC) 15 mg, Daily    ondansetron  (ZOFRAN  ODT) 8 mg, Sublingual, EVERY 8 HOURS PRN    pravastatin (PRAVACHOL) 40 mg    ribociclib  (KISQALI ) 600 mg/day (200 mg x 3) Oral Tablet Take 3 tablets (600 mg total) by mouth once daily for 21 days followed by 7 days off. Repeat every 28 days.        REVIEW OF SYSTEMS  Pertinent review of systems as discussed in Interval History.      Objective:     Vitals:    05/09/24 0926   BP: 139/70   Pulse: 94   Resp: 16   Temp: 37 C (98.6 F)   Weight: 97.5 kg (215 lb)             PHYSICAL EXAMINATION  Physical Exam  Constitutional:       Appearance: Normal appearance.   Eyes:      Pupils: Pupils are equal, round, and reactive to light.   Cardiovascular:      Rate and Rhythm: Normal rate.   Pulmonary:      Effort: Pulmonary effort is normal.   Musculoskeletal:         General: Normal range of motion.   Skin:     General: Skin is warm and dry.   Neurological:      General: No focal deficit present.      Mental Status: She is alert and oriented to person, place, and time.   Psychiatric:         Mood and Affect: Mood normal.         Behavior: Behavior normal.          LABS/IMAGING: All relevant labs and imaging were reviewed as per HPI.      Fairy Patten, MD 05/09/2024, 09:42  cc:[id number]@          [1]   Allergies  Allergen Reactions    Amoxicillin      Other Reaction(s): Not available

## 2024-05-10 ENCOUNTER — Ambulatory Visit
Admission: RE | Admit: 2024-05-10 | Discharge: 2024-05-10 | Disposition: A | Source: Ambulatory Visit | Attending: RADIATION ONCOLOGY

## 2024-05-10 ENCOUNTER — Other Ambulatory Visit (HOSPITAL_BASED_OUTPATIENT_CLINIC_OR_DEPARTMENT_OTHER): Payer: Self-pay | Admitting: RADIATION ONCOLOGY

## 2024-05-10 ENCOUNTER — Ambulatory Visit (HOSPITAL_BASED_OUTPATIENT_CLINIC_OR_DEPARTMENT_OTHER)

## 2024-05-10 DIAGNOSIS — K769 Liver disease, unspecified: Secondary | ICD-10-CM | POA: Insufficient documentation

## 2024-05-10 DIAGNOSIS — C7951 Secondary malignant neoplasm of bone: Secondary | ICD-10-CM | POA: Insufficient documentation

## 2024-05-10 DIAGNOSIS — C50919 Malignant neoplasm of unspecified site of unspecified female breast: Secondary | ICD-10-CM | POA: Insufficient documentation

## 2024-05-10 LAB — BUN/CREATININE RATIO
BUN/CREA RATIO: 17
BUN: 15 mg/dL (ref 7–18)
CREATININE: 0.9 mg/dL (ref 0.55–1.02)
ESTIMATED GFR: 71 mL/min/1.73mˆ2 (ref >59)

## 2024-05-10 MED ORDER — IOPAMIDOL 370 MG IODINE/ML (76 %) INTRAVENOUS SOLUTION
100.0000 mL | INTRAVENOUS | Status: AC
Start: 2024-05-10 — End: 2024-05-10
  Administered 2024-05-10: 100 mL via INTRAVENOUS
  Filled 2024-05-10: qty 100

## 2024-05-13 ENCOUNTER — Other Ambulatory Visit (INDEPENDENT_AMBULATORY_CARE_PROVIDER_SITE_OTHER): Payer: Self-pay | Admitting: HEMATOLOGY-ONCOLOGY

## 2024-05-15 ENCOUNTER — Other Ambulatory Visit: Payer: Self-pay

## 2024-05-15 ENCOUNTER — Ambulatory Visit
Admission: RE | Admit: 2024-05-15 | Discharge: 2024-05-15 | Disposition: A | Discharge status: Home / Self Care | Payer: Self-pay | Source: Ambulatory Visit | Attending: HEMATOLOGY-ONCOLOGY | Admitting: HEMATOLOGY-ONCOLOGY

## 2024-05-15 ENCOUNTER — Other Ambulatory Visit (HOSPITAL_BASED_OUTPATIENT_CLINIC_OR_DEPARTMENT_OTHER): Payer: Self-pay | Admitting: RADIATION ONCOLOGY

## 2024-05-15 VITALS — BP 150/72 | HR 85 | Temp 97.7°F | Resp 18

## 2024-05-15 DIAGNOSIS — C50919 Malignant neoplasm of unspecified site of unspecified female breast: Secondary | ICD-10-CM

## 2024-05-15 DIAGNOSIS — M8580 Other specified disorders of bone density and structure, unspecified site: Secondary | ICD-10-CM | POA: Insufficient documentation

## 2024-05-15 DIAGNOSIS — Z853 Personal history of malignant neoplasm of breast: Secondary | ICD-10-CM

## 2024-05-15 DIAGNOSIS — N959 Unspecified menopausal and perimenopausal disorder: Secondary | ICD-10-CM | POA: Insufficient documentation

## 2024-05-15 DIAGNOSIS — Z5111 Encounter for antineoplastic chemotherapy: Secondary | ICD-10-CM | POA: Insufficient documentation

## 2024-05-15 MED ORDER — DENOSUMAB 60 MG/ML SUBCUTANEOUS SYRINGE
60.0000 mg | INJECTION | Freq: Once | SUBCUTANEOUS | Status: AC
Start: 2024-05-15 — End: 2024-05-15
  Administered 2024-05-15: 60 mg via SUBCUTANEOUS
  Filled 2024-05-15: qty 1

## 2024-05-15 MED ORDER — FULVESTRANT 250 MG/5 ML INTRAMUSCULAR SYRINGE
500.0000 mg | INJECTION | Freq: Once | INTRAMUSCULAR | Status: AC
Start: 2024-05-15 — End: 2024-05-15
  Administered 2024-05-15: 500 mg via INTRAMUSCULAR
  Filled 2024-05-15: qty 10

## 2024-05-15 NOTE — Nurses Notes (Addendum)
 [id number]-Arrived to OP ONC ambulatory.  Here for Faslodex  injections.  No voiced complaints offered.  Fasalodex 250mg /39ml administered IM in left and right buttocks over 90 seconds each. Gauze with adhesive bandage applied to both injection sites.  Tolerated well.  Calcium level verified on pt online [facility] app and was 9.5 on 04/19/2024.Prolia  60 mg administered sub-q in right upper arm.  Tolerated well.  Adhesive bandage applied.  Left OP ONC ambulatory.  No s/s of distress, VSS. Alan Borrow, RN

## 2024-05-16 ENCOUNTER — Ambulatory Visit (HOSPITAL_BASED_OUTPATIENT_CLINIC_OR_DEPARTMENT_OTHER): Admitting: RADIATION ONCOLOGY

## 2024-05-20 ENCOUNTER — Other Ambulatory Visit: Payer: Self-pay

## 2024-05-20 ENCOUNTER — Other Ambulatory Visit (INDEPENDENT_AMBULATORY_CARE_PROVIDER_SITE_OTHER): Payer: Self-pay | Admitting: HEMATOLOGY-ONCOLOGY

## 2024-05-20 ENCOUNTER — Encounter (INDEPENDENT_AMBULATORY_CARE_PROVIDER_SITE_OTHER): Payer: Self-pay | Admitting: HEMATOLOGY-ONCOLOGY

## 2024-05-20 ENCOUNTER — Ambulatory Visit: Payer: Self-pay | Attending: HEMATOLOGY-ONCOLOGY | Admitting: HEMATOLOGY-ONCOLOGY

## 2024-05-20 ENCOUNTER — Ambulatory Visit (INDEPENDENT_AMBULATORY_CARE_PROVIDER_SITE_OTHER)
Admission: RE | Admit: 2024-05-20 | Discharge: 2024-05-20 | Disposition: A | Source: Ambulatory Visit | Attending: HEMATOLOGY-ONCOLOGY

## 2024-05-20 VITALS — BP 154/84 | HR 86 | Temp 97.7°F | Ht 63.5 in | Wt 215.5 lb

## 2024-05-20 DIAGNOSIS — C50919 Malignant neoplasm of unspecified site of unspecified female breast: Secondary | ICD-10-CM

## 2024-05-20 LAB — MANUAL DIFFERENTIAL
BASOPHIL %: 2 % (ref 0–3)
BASOPHIL ABSOLUTE: 0.04 x10ˆ3/uL (ref 0.00–0.30)
BASOPHILS MANUAL: 2
EOSINOPHIL %: 3 % (ref 0–7)
EOSINOPHIL ABSOLUTE: 0.06 x10ˆ3/uL (ref 0.00–0.80)
EOSINOPHILS MANUAL: 3
LYMPHOCYTE %: 35 % (ref 25–45)
LYMPHOCYTE ABSOLUTE: 0.67 x10ˆ3/uL — ABNORMAL LOW (ref 1.10–5.00)
LYMPHOCYTES MANUAL: 35
METAMYELOCYTE %: 1 %
METAMYELOCYTE ABSOLUTE: 0.02 x10ˆ3/uL
METAMYELOCYTES MANUAL: 1
MONOCYTE %: 1 % (ref 0–12)
MONOCYTE ABSOLUTE: 0.02 x10ˆ3/uL (ref 0.00–0.30)
MONOCYTES MANUAL: 1
MYELOCYTE %: 1 %
MYELOCYTE ABSOLUTE: 0.02 x10ˆ3/uL
MYELOCYTES MANUAL: 1
NEUTROPHIL %: 57 % (ref 40–76)
NEUTROPHIL ABSOLUTE: 1.08 x10ˆ3/uL — ABNORMAL LOW (ref 1.80–8.40)
NEUTROPHILS MANUAL: 57
PLATELET MORPHOLOGY COMMENT: NORMAL
RBC MORPHOLOGY COMMENT: NORMAL
SCHISTOCYTES: ABSENT
TOTAL CELLS COUNTED [#] IN BLOOD: 100
WBC: 1.9 x10ˆ3/uL

## 2024-05-20 LAB — COMPREHENSIVE METABOLIC PANEL, NON-FASTING
ALBUMIN/GLOBULIN RATIO: 1.9 — ABNORMAL HIGH (ref 0.8–1.4)
ALBUMIN: 4.3 g/dL (ref 3.5–5.7)
ALKALINE PHOSPHATASE: 72 U/L (ref 34–104)
ALT (SGPT): 15 U/L (ref 7–52)
ANION GAP: 4 mmol/L (ref 4–13)
AST (SGOT): 29 U/L (ref 13–39)
BILIRUBIN TOTAL: 0.7 mg/dL (ref 0.3–1.0)
BUN/CREA RATIO: 20 (ref 6–22)
BUN: 15 mg/dL (ref 7–25)
CALCIUM, CORRECTED: 8.2 mg/dL — ABNORMAL LOW (ref 8.9–10.8)
CALCIUM: 8.4 mg/dL — ABNORMAL LOW (ref 8.6–10.3)
CHLORIDE: 107 mmol/L (ref 98–107)
CO2 TOTAL: 27 mmol/L (ref 21–31)
CREATININE: 0.76 mg/dL (ref 0.60–1.30)
ESTIMATED GFR: 87 mL/min/1.73mˆ2 (ref >59)
GLOBULIN: 2.3 (ref 2.0–3.5)
GLUCOSE: 111 mg/dL — ABNORMAL HIGH (ref 74–109)
OSMOLALITY, CALCULATED: 277 mosm/kg (ref 270–290)
POTASSIUM: 4.3 mmol/L (ref 3.5–5.1)
PROTEIN TOTAL: 6.6 g/dL (ref 6.4–8.9)
SODIUM: 138 mmol/L (ref 136–145)

## 2024-05-20 LAB — CBC
HCT: 34.6 % (ref 31.2–41.9)
HGB: 12.3 g/dL (ref 10.9–14.3)
MCH: 39.1 pg — ABNORMAL HIGH (ref 24.7–32.8)
MCHC: 35.7 g/dL — ABNORMAL HIGH (ref 32.3–35.6)
MCV: 109.5 fL — ABNORMAL HIGH (ref 75.5–95.3)
MPV: 8.9 fL (ref 7.9–10.8)
PLATELETS: 141 x10ˆ3/uL (ref 140–440)
RBC: 3.16 x10ˆ6/uL — ABNORMAL LOW (ref 3.63–4.92)
RDW: 13.7 % (ref 12.3–17.7)
WBC: 1.9 x10ˆ3/uL — CL (ref 3.8–11.8)

## 2024-05-20 MED ORDER — FAMOTIDINE 20 MG TABLET
20.0000 mg | ORAL_TABLET | Freq: Every evening | ORAL | 4 refills | Status: AC
Start: 2024-05-20 — End: ?

## 2024-05-20 MED ORDER — ONDANSETRON 8 MG DISINTEGRATING TABLET
8.0000 mg | ORAL_TABLET | Freq: Three times a day (TID) | ORAL | 4 refills | Status: AC | PRN
Start: 2024-05-20 — End: ?

## 2024-05-20 NOTE — Nursing Note (Signed)
 Patient in room. Went over last scans and labs. All questions answered. Continue current treatment. Continue to monitor labs. Patient to return in 2 months.

## 2024-05-20 NOTE — Progress Notes (Signed)
 Department of Hematology/Oncology  Progress Note   Name: Melanie Snyder  FMW:[id number]  Date of Birth: 1955/10/14  Encounter Date: 05/20/2024    REFERRING PROVIDER:  Steen Lenis, MD  [address]  [place],  [place] [address]    TELEMEDICINE DOCUMENTATION:  Patient Location:  [facility], [facility] outpatient Hematology/Oncology [address], [place] [address]  Patient/family aware of provider location:  yes  Patient/family consent for telemedicine:  yes    REASON FOR OFFICE VISIT:  Breast Cancer     HISTORY OF PRESENT ILLNESS:  Melanie Snyder is a 68 y.o. female who presents today for assistance with managing metastatic breast cancer.    The patient has a history of infiltrating ductal carcinoma of the right breast.  She was stage III at the time of initial diagnosis.  T3 N2 M0, grade 2.  ER positive and HER2 Neu negative.  She underwent adjuvant therapy with Adriamycin and Cytoxan followed by paclitaxel.  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      She had adjuvant radiation therapy, which completed in January of 2021.  She has been on treatment with adjuvant letrozole  since January of 2021.  Late last year she was found to have circulating tumor DNA present.  This was followed by an imaging study which showed a left humerus metastatic deposit as well as a possible lesion in the lumbar spine.    01/24/2024: The patient is here for follow up of metastatic breast cancer.  She states that she tried taking the Aromasin , but ended up having issues with the burning sensation around her growing and waist area.  She states that it was terrible and has discontinued that medication.  She has been taking the Kisqali  without any problems.    02/21/2024: The patient is here for follow up of metastatic breast cancer.  She is to begin treatment with Faslodex  and Kisqali  today.    03/20/2024: The patient is here for follow up of metastatic  breast cancer.  She has been on treatment with Faslodex  and Kisqali .  She states that she is tolerating treatment well, and her most recent Signatera test shows impressive improvement.    05/20/2024: The patient is here for follow up of metastatic breast cancer.  She is doing well lately.  She did have a relatively recent imaging study which had shown a possible lesion in the liver, although after changing treatment it appears that the lesion is no longer visible.  She has seen Radiation Oncology and they are monitoring the situation as well.    ROS:   Pertinent review of systems as discussed in HPI    HISTORY:  Past Medical History:   Diagnosis Date    Breast CA     RIGHT CHEMO/RADIATION    Breast cancer 01/11/2022    Cardiac arrhythmia     Hyperlipidemia     Hypothyroidism     Malignant neoplasm of right female breast     Osteopenia          Past Surgical History:   Procedure Laterality Date    CESAREAN SECTION N/A     HX BREAST BIOPSY Right     6 months of chemo and radiation after that    HX BREAST LUMPECTOMY Right     CHEMO/RADIATION    HX BREAST REDUCTION Left          Social History     Socioeconomic History  Marital status: Married     Spouse name: Not on file    Number of children: Not on file    Years of education: Not on file    Highest education level: Not on file   Occupational History    Not on file   Tobacco Use    Smoking status: Never    Smokeless tobacco: Never   Vaping Use    Vaping status: Never Used   Substance and Sexual Activity    Alcohol use: Never    Drug use: Never    Sexual activity: Not Currently   Other Topics Concern    Not on file   Social History Narrative    Not on file     Social Determinants of Health     Financial Resource Strain: Not on file   Transportation Needs: Not on file   Social Connections: Not on file   Intimate Partner Violence: Not on file   Housing Stability: Not on file     Family Medical History:       Problem Relation (Age of Onset)    Cancer Mother, Maternal  Grandfather    Coronary Artery Disease Paternal Aunt, Paternal Uncle    No Known Problems Father, Sister, Brother, Maternal Grandmother, Paternal Grandmother, Paternal Grandfather, Daughter, Son, Maternal Aunt, Maternal Uncle, Other            Current Outpatient Medications   Medication Sig    calcium carbonate/vitamin D3 (CALTRATE-600 PLUS VITAMIN D3 ORAL) Caltrate 600 plus D    cetirizine (ZYRTEC) 10 mg Oral Tablet Take 1 Tablet (10 mg total) by mouth Once per day as needed    famotidine  (PEPCID ) 20 mg Oral Tablet Take 1 Tablet (20 mg total) by mouth Every evening    HYDROcodone -acetaminophen  (NORCO) 5-325 mg Oral Tablet Take 1 Tablet by mouth Every 6 hours as needed for Pain for up to 30 days    levothyroxine (SYNTHROID) 112 mcg Oral Tablet     lidocaine -prilocaine (EMLA) 2.5-2.5 % Cream APPLY CREAM TOPICALLY TO AFFECTED AREA TWICE DAILY AS NEEDED    meloxicam (MOBIC) 15 mg Oral Tablet Take 1 Tablet (15 mg total) by mouth Daily    ondansetron  (ZOFRAN  ODT) 8 mg Oral Tablet, Rapid Dissolve Place 1 Tablet (8 mg total) under the tongue Every 8 hours as needed for Nausea/Vomiting    pravastatin (PRAVACHOL) 40 mg Oral Tablet Take 1 Tablet (40 mg total) by mouth    ribociclib  (KISQALI ) 600 mg/day (200 mg x 3) Oral Tablet Take 3 tablets (600 mg total) by mouth once daily for 21 days followed by 7 days off. Repeat every 28 days.     Allergies   Allergen Reactions    Amoxicillin      Other Reaction(s): Not available       PHYSICAL EXAM:  Most Recent Vitals    Flowsheet Row Office Visit from 12/28/2023 in Hematology/Oncology, [facility]   Temperature 36.8 C (98.2 F) filed at... 12/28/2023 0847   Heart Rate 105 filed at... 12/28/2023 0847   Respiratory Rate --   BP (Non-Invasive) 148/85 filed at... 12/28/2023 0847   SpO2 99 % filed at... 12/28/2023 0847   Height 1.6 m (5' 3) filed at... 12/28/2023 0847   Weight 96.8 kg (213 lb 6.4 oz) filed at... 12/28/2023 0847   BMI (Calculated) 37.88 filed at... 12/28/2023 0847    BSA (Calculated) 2.07 filed at... 12/28/2023 0847      ECOG Status: (0) Fully active, able  to carry on all predisease performance without restriction   Physical Exam    DIAGNOSTIC DATA:  No results found for this or any previous visit (from the past 82479 hours).    LABS:   CBC  Diff   Lab Results   Component Value Date/Time    WBC 2.1 (L) 03/20/2024 03:02 PM    HGB 12.9 03/20/2024 03:02 PM    HCT 36.2 03/20/2024 03:02 PM    PLTCNT 200 03/20/2024 03:02 PM    RBC 3.39 (L) 03/20/2024 03:02 PM    MCV 106.8 (H) 03/20/2024 03:02 PM    MCHC 35.7 (H) 03/20/2024 03:02 PM    MCH 38.1 (H) 03/20/2024 03:02 PM    RDW 17.1 03/20/2024 03:02 PM    MPV 8.7 03/20/2024 03:02 PM    Lab Results   Component Value Date/Time    PMNS 57 03/20/2024 03:02 PM    LYMPHOCYTES 28 03/20/2024 03:02 PM    EOSINOPHIL 4 03/20/2024 03:02 PM    MONOCYTES 10 03/20/2024 03:02 PM    BASOPHILS 1 03/20/2024 03:02 PM    BASOPHILS 0.00 03/20/2024 03:02 PM    PMNABS 1.20 (L) 03/20/2024 03:02 PM    LYMPHSABS 0.60 (L) 03/20/2024 03:02 PM    EOSABS 0.10 03/20/2024 03:02 PM    MONOSABS 0.20 03/20/2024 03:02 PM            Comprehensive Metabolic Profile    Lab Results   Component Value Date    SODIUM 138 05/20/2024    POTASSIUM 4.3 05/20/2024    CHLORIDE 107 05/20/2024    CO2 27 05/20/2024    ANIONGAP 4 05/20/2024    BUN 15 05/20/2024    CREATININE 0.76 05/20/2024    ALBUMIN 4.3 05/20/2024    CALCIUM 8.4 (L) 05/20/2024    GLUCOSENF 111 (H) 05/20/2024    ALKPHOS 72 05/20/2024    ALT 15 05/20/2024    AST 29 05/20/2024    TOTBILIRUBIN 0.7 05/20/2024    TOTALPROTEIN 6.6 05/20/2024          BASIC METABOLIC PANEL  Lab Results   Component Value Date    SODIUM 138 05/20/2024    POTASSIUM 4.3 05/20/2024    CHLORIDE 107 05/20/2024    CO2 27 05/20/2024    ANIONGAP 4 05/20/2024    BUN 15 05/20/2024    CREATININE 0.76 05/20/2024    BUNCRRATIO 20 05/20/2024    GFR 87 05/20/2024    CALCIUM 8.4 (L) 05/20/2024    GLUCOSENF 111 (H) 05/20/2024           ASSESSMENT:  Problem List  Items Addressed This Visit          Oncology    Breast cancer - Primary    Relevant Orders    COMPREHENSIVE METABOLIC PANEL, NON-FASTING    CBC    CA 27,29        ICD-10-CM    1. Breast cancer  C50.919 COMPREHENSIVE METABOLIC PANEL, NON-FASTING     CBC     CA 27,29         TREATMENT HISTORY:  Letrozole   Exemestane /palbociclib (did not tolerate exemestane )  Fulvestrant /palbociclib (with plan to add Inavoisib in the near future)    PLAN:   1. All relevant medical records were reviewed including available pertinent provider notes, procedure notes, imaging, laboratory, and pathology.   2. All pertinent labs and/or imaging were reviewed with the patient.   3. Breast cancer: Stage IV.  This was a recurrence from stage III  disease that was initially T3 N2 M0.  ER positive, HER2 Neu negative.  She has developed a recurrence manifesting as letrozole  failure.  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  She is currently on treatment with Faslodex  and Kisqali , and is responding well.  We can consider adding the third agent (Inavolisib) at a later date, potentially with an initial dose reduction.  With the improvement in her Signatera test and reassuring imaging studies, we will continue treatment unchanged for now.    Melanie Snyder was given the chance to ask questions, and these were answered to their satisfaction. The patient is welcome to call with any questions or concerns in the meantime.     On the day of the encounter, a total of 35 minutes was spent on this patient encounter including review of historical information, examination, documentation and post-visit activities.   Return in about 2 months (around 07/20/2024).     Lynwood Pipes, MD  05/20/2024 , 14:55  The patient was seen as part of a collaborative telemedicine service with Dr. Pipes who participated in the encounter by active presence via approved video/audio means for portions of the encounter.    The patient's insurance company  bears full legal and financial responsibility resulting from any deviations that they cause to my recommended treatment plan.    CC:  Etta Caldron Reserve, PA-C  [address]  [place] [address]    Steen Lenis, MD  [address]  [place],  [place] [address]    This note was partially generated using MModal Fluency Direct system, and there may be some incorrect words, spellings, and punctuation that were not noted in checking the note before saving.

## 2024-05-21 ENCOUNTER — Other Ambulatory Visit: Payer: Self-pay

## 2024-05-23 ENCOUNTER — Other Ambulatory Visit (HOSPITAL_COMMUNITY): Payer: Self-pay

## 2024-05-23 ENCOUNTER — Telehealth (INDEPENDENT_AMBULATORY_CARE_PROVIDER_SITE_OTHER): Payer: Self-pay | Admitting: NURSE PRACTITIONER

## 2024-05-23 ENCOUNTER — Other Ambulatory Visit: Payer: Self-pay

## 2024-05-23 ENCOUNTER — Encounter (HOSPITAL_COMMUNITY): Payer: Self-pay

## 2024-05-23 ENCOUNTER — Other Ambulatory Visit (INDEPENDENT_AMBULATORY_CARE_PROVIDER_SITE_OTHER): Payer: Self-pay | Admitting: NURSE PRACTITIONER

## 2024-05-23 ENCOUNTER — Ambulatory Visit

## 2024-05-23 DIAGNOSIS — C50919 Malignant neoplasm of unspecified site of unspecified female breast: Secondary | ICD-10-CM

## 2024-05-23 DIAGNOSIS — M810 Age-related osteoporosis without current pathological fracture: Secondary | ICD-10-CM

## 2024-05-23 LAB — CA 27,29: CA 27.29: 51 U/mL — ABNORMAL HIGH (ref <38)

## 2024-05-23 NOTE — Telephone Encounter (Signed)
 Received call from Patient asking about her elevated CA 27.29 at 53. Previously it was 22 in July.  She is also getting Signatera's by Dr. Cherylene.  Patient was concerned about this.  I discussed CA 27.29 with her and that it can be influenced by other issues not just breast cancer and that elevated number can be seen in the absence of cancer and normal number can be seen with cancer present.   Her last Signatera on 05/09/24 was improved from 50's to 13.09 and this is the number I recommend going by.  Her next Signatera is due in November.  I have placed an order for a CA 27.29 when she has her Signatera drawn.  Patient stated understanding and was appreciative of the lab order.     Randine Clara APRN, FNP-BC, AOCNP, 05/23/2024 , 10:43

## 2024-05-28 ENCOUNTER — Other Ambulatory Visit: Payer: Self-pay

## 2024-05-30 ENCOUNTER — Ambulatory Visit

## 2024-06-06 ENCOUNTER — Ambulatory Visit (HOSPITAL_BASED_OUTPATIENT_CLINIC_OR_DEPARTMENT_OTHER): Payer: Self-pay

## 2024-06-07 ENCOUNTER — Ambulatory Visit (HOSPITAL_BASED_OUTPATIENT_CLINIC_OR_DEPARTMENT_OTHER): Payer: Self-pay

## 2024-06-10 ENCOUNTER — Other Ambulatory Visit (INDEPENDENT_AMBULATORY_CARE_PROVIDER_SITE_OTHER): Payer: Self-pay | Admitting: HEMATOLOGY-ONCOLOGY

## 2024-06-11 ENCOUNTER — Encounter (HOSPITAL_COMMUNITY): Payer: Self-pay | Admitting: HEMATOLOGY-ONCOLOGY

## 2024-06-12 ENCOUNTER — Ambulatory Visit
Admission: RE | Admit: 2024-06-12 | Discharge: 2024-06-12 | Disposition: A | Discharge status: Home / Self Care | Source: Ambulatory Visit | Attending: HEMATOLOGY-ONCOLOGY | Admitting: HEMATOLOGY-ONCOLOGY

## 2024-06-12 ENCOUNTER — Other Ambulatory Visit: Payer: Self-pay

## 2024-06-12 VITALS — BP 123/63 | HR 91 | Temp 97.2°F | Resp 18

## 2024-06-12 DIAGNOSIS — C50919 Malignant neoplasm of unspecified site of unspecified female breast: Secondary | ICD-10-CM | POA: Insufficient documentation

## 2024-06-12 DIAGNOSIS — Z5111 Encounter for antineoplastic chemotherapy: Secondary | ICD-10-CM | POA: Insufficient documentation

## 2024-06-12 MED ORDER — FULVESTRANT 250 MG/5 ML INTRAMUSCULAR SYRINGE
500.0000 mg | INJECTION | Freq: Once | INTRAMUSCULAR | Status: AC
Start: 2024-06-12 — End: 2024-06-12
  Administered 2024-06-12: 500 mg via INTRAMUSCULAR
  Filled 2024-06-12: qty 10

## 2024-06-12 NOTE — Nurses Notes (Signed)
 [id number]: Arrived ambulatory to outpatient oncology. Here today for Faslodex  injections. Denies all complaints at present. Faslodex  250mg /64ml injections administered IM in right and left buttocks over 90 seconds each. Dressing applied to each site. No bleeding noted. Tolerated injections well. Ambulatory out of department with no voiced complaints. Delon Larve, RN

## 2024-06-16 ENCOUNTER — Ambulatory Visit
Admission: RE | Admit: 2024-06-16 | Discharge: 2024-06-16 | Disposition: A | Discharge status: Home / Self Care | Source: Ambulatory Visit | Attending: RADIATION ONCOLOGY | Admitting: RADIATION ONCOLOGY

## 2024-06-16 ENCOUNTER — Other Ambulatory Visit: Payer: Self-pay

## 2024-06-16 ENCOUNTER — Encounter (HOSPITAL_COMMUNITY): Payer: Self-pay | Admitting: HEMATOLOGY-ONCOLOGY

## 2024-06-16 DIAGNOSIS — Z853 Personal history of malignant neoplasm of breast: Secondary | ICD-10-CM | POA: Insufficient documentation

## 2024-06-16 MED ORDER — GADOBUTROL 10 MMOL/10 ML (1 MMOL/ML) INTRAVENOUS SOLUTION
10.0000 mL | INTRAVENOUS | Status: AC
Start: 2024-06-16 — End: 2024-06-16
  Administered 2024-06-16: 9 mL via INTRAVENOUS

## 2024-06-17 DIAGNOSIS — Z853 Personal history of malignant neoplasm of breast: Secondary | ICD-10-CM

## 2024-06-17 DIAGNOSIS — R160 Hepatomegaly, not elsewhere classified: Secondary | ICD-10-CM

## 2024-06-18 ENCOUNTER — Other Ambulatory Visit: Payer: Self-pay

## 2024-06-18 ENCOUNTER — Ambulatory Visit
Admission: RE | Admit: 2024-06-18 | Discharge: 2024-06-18 | Disposition: A | Discharge status: Still In Hospital | Source: Ambulatory Visit | Attending: RADIATION ONCOLOGY | Admitting: RADIATION ONCOLOGY

## 2024-06-18 ENCOUNTER — Telehealth (INDEPENDENT_AMBULATORY_CARE_PROVIDER_SITE_OTHER): Payer: Self-pay | Admitting: HEMATOLOGY-ONCOLOGY

## 2024-06-18 ENCOUNTER — Ambulatory Visit: Attending: RADIATION ONCOLOGY

## 2024-06-18 DIAGNOSIS — C50911 Malignant neoplasm of unspecified site of right female breast: Secondary | ICD-10-CM | POA: Insufficient documentation

## 2024-06-18 DIAGNOSIS — C50919 Malignant neoplasm of unspecified site of unspecified female breast: Secondary | ICD-10-CM

## 2024-06-18 NOTE — Progress Notes (Signed)
 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  Now with progression on Kisquali      Assessment:  68 year old with recurrent breast cancer with a rising Signatera and MRI showing multiple metastases.      Recommendations:  I have recommended she be seen in Medical Oncology and I have contacted Dr. Moses.  He will have her back quickly to change her chemotherapy regimen.  We will refer her to Dr. Deatrice Nam for Port-A-Cath insertion.  I will see her back in 1 month.  She is instructed to call in the interim should she have problems or concerns.    The indications, time course, benefits, risks and side effects of radiation treatment were explained to the patient, and her questions were answered to her apparent satisfaction. I encouraged her to contact us  at any time should she have any further questions or concerns. I personally saw and examined the patient, and reviewed all prior imaging and pathologic findings with her. On the day of the encounter, a total of 30 minutes was spent on this patient encounter including review of historical data, examination, documentation and post-visit activities.  This time documented excludes procedural time.   FULL NOTE     Interval History :   Melanie Snyder is a 68 y.o. female with a history of breast cancer.  She has had recurrence in his on Kisquali at this time tolerating it reasonably well.  She returns after MRI of the abdomen showing progression in multiple liver lesions.  She reports some numbness in the left lower leg but otherwise has no new symptoms.    Pain Assessment:  Continued  occasional pain    Past Medical/Surgical History:  Past Medical History:   Diagnosis Date    Breast CA     RIGHT CHEMO/RADIATION    Breast cancer 01/11/2022    Cardiac arrhythmia     Hyperlipidemia     Hypothyroidism     Malignant neoplasm of right female breast     Osteopenia          Past Surgical History:   Procedure Laterality Date    CESAREAN SECTION N/A     HX BREAST BIOPSY Right     6 months of chemo and radiation after that    HX BREAST LUMPECTOMY Right     CHEMO/RADIATION    HX BREAST REDUCTION Left            Family History:   Family Medical History:       Problem Relation (Age of Onset)    Cancer Mother, Maternal Grandfather    Coronary Artery Disease Paternal Aunt, Paternal Uncle    No Known Problems Father, Sister, Brother, Maternal Grandmother, Paternal Grandmother, Paternal Grandfather, Daughter, Son, Maternal Aunt, Maternal Uncle, Other              Social History:   Social History     Socioeconomic History    Marital status: Married     Spouse name: Not on file    Number of children: Not on file    Years of education:  Not on file    Highest education level: Not on file   Occupational History    Not on file   Tobacco Use    Smoking status: Never    Smokeless tobacco: Never   Vaping Use    Vaping status: Never Used   Substance and Sexual Activity    Alcohol use: Never    Drug use: Never    Sexual activity: Not Currently   Other Topics Concern    Not on file   Social History Narrative    Not on file     Social Determinants of Health     Financial Resource Strain: Not on file   Transportation Needs: Not on file   Social Connections: Not on file   Intimate Partner Violence: Not on file   Housing Stability: Not on file       ALLERGIES:   Allergies[1]     MEDICATIONS:   Current Outpatient Medications   Medication Instructions    calcium carbonate/vitamin D3 (CALTRATE-600 PLUS VITAMIN D3 ORAL) Caltrate 600 plus D    cetirizine (ZYRTEC) 10 mg, DAILY PRN    famotidine  (PEPCID ) 20 mg, Oral, EVERY EVENING     levothyroxine (SYNTHROID) 112 mcg Oral Tablet     lidocaine -prilocaine (EMLA) 2.5-2.5 % Cream APPLY CREAM TOPICALLY TO AFFECTED AREA TWICE DAILY AS NEEDED    meloxicam (MOBIC) 15 mg, Daily    ondansetron  (ZOFRAN  ODT) 8 mg, Sublingual, EVERY 8 HOURS PRN    pravastatin (PRAVACHOL) 40 mg    ribociclib  (KISQALI ) 600 mg/day (200 mg x 3) Oral Tablet Take 3 tablets (600 mg total) by mouth once daily for 21 days followed by 7 days off. Repeat every 28 days.        REVIEW OF SYSTEMS  Pertinent review of systems as discussed in Interval History.      Objective:     There were no vitals filed for this visit.            PHYSICAL EXAMINATION  Physical Exam  Constitutional:       Appearance: Normal appearance.   Eyes:      Pupils: Pupils are equal, round, and reactive to light.   Cardiovascular:      Rate and Rhythm: Normal rate.   Pulmonary:      Effort: Pulmonary effort is normal.   Musculoskeletal:         General: Normal range of motion.   Skin:     General: Skin is warm and dry.   Neurological:      General: No focal deficit present.      Mental Status: She is alert and oriented to person, place, and time.   Psychiatric:         Mood and Affect: Mood normal.         Behavior: Behavior normal.          LABS/IMAGING: All relevant labs and imaging were reviewed as per HPI.      Fairy Patten, MD 06/18/2024, 16:19    rr:[id number]@            [1]   Allergies  Allergen Reactions    Amoxicillin      Other Reaction(s): Not available

## 2024-06-20 ENCOUNTER — Ambulatory Visit (HOSPITAL_COMMUNITY)

## 2024-06-20 ENCOUNTER — Other Ambulatory Visit: Payer: Self-pay

## 2024-06-20 ENCOUNTER — Telehealth (INDEPENDENT_AMBULATORY_CARE_PROVIDER_SITE_OTHER): Payer: Self-pay | Admitting: HEMATOLOGY-ONCOLOGY

## 2024-06-20 ENCOUNTER — Ambulatory Visit (HOSPITAL_COMMUNITY): Admitting: Anesthesiology

## 2024-06-20 ENCOUNTER — Encounter (HOSPITAL_COMMUNITY): Payer: Self-pay | Admitting: Surgery

## 2024-06-20 ENCOUNTER — Ambulatory Visit (INDEPENDENT_AMBULATORY_CARE_PROVIDER_SITE_OTHER)
Admission: RE | Admit: 2024-06-20 | Discharge: 2024-06-20 | Disposition: A | Discharge status: Home / Self Care | Source: Ambulatory Visit | Attending: HEMATOLOGY-ONCOLOGY | Admitting: HEMATOLOGY-ONCOLOGY

## 2024-06-20 ENCOUNTER — Encounter (HOSPITAL_COMMUNITY): Payer: Self-pay | Admitting: HEMATOLOGY-ONCOLOGY

## 2024-06-20 ENCOUNTER — Ambulatory Visit (INDEPENDENT_AMBULATORY_CARE_PROVIDER_SITE_OTHER): Admitting: HEMATOLOGY-ONCOLOGY

## 2024-06-20 ENCOUNTER — Ambulatory Visit (HOSPITAL_COMMUNITY): Admitting: Surgery

## 2024-06-20 ENCOUNTER — Ambulatory Visit
Admission: RE | Admit: 2024-06-20 | Discharge: 2024-06-20 | Disposition: A | Discharge status: Home / Self Care | Source: Ambulatory Visit | Attending: Surgery | Admitting: Surgery

## 2024-06-20 ENCOUNTER — Encounter (INDEPENDENT_AMBULATORY_CARE_PROVIDER_SITE_OTHER): Payer: Self-pay | Admitting: HEMATOLOGY-ONCOLOGY

## 2024-06-20 ENCOUNTER — Encounter (HOSPITAL_COMMUNITY)
Admission: RE | Disposition: A | Discharge status: Home / Self Care | Payer: Self-pay | Source: Ambulatory Visit | Attending: Surgery

## 2024-06-20 VITALS — BP 147/95 | HR 98 | Temp 97.9°F | Ht 63.0 in | Wt 214.6 lb

## 2024-06-20 DIAGNOSIS — Z170 Estrogen receptor positive status [ER+]: Secondary | ICD-10-CM | POA: Insufficient documentation

## 2024-06-20 DIAGNOSIS — Z923 Personal history of irradiation: Secondary | ICD-10-CM | POA: Insufficient documentation

## 2024-06-20 DIAGNOSIS — C50919 Malignant neoplasm of unspecified site of unspecified female breast: Secondary | ICD-10-CM

## 2024-06-20 DIAGNOSIS — C7951 Secondary malignant neoplasm of bone: Secondary | ICD-10-CM | POA: Insufficient documentation

## 2024-06-20 DIAGNOSIS — C50911 Malignant neoplasm of unspecified site of right female breast: Secondary | ICD-10-CM

## 2024-06-20 DIAGNOSIS — C50912 Malignant neoplasm of unspecified site of left female breast: Secondary | ICD-10-CM

## 2024-06-20 DIAGNOSIS — Z9221 Personal history of antineoplastic chemotherapy: Secondary | ICD-10-CM | POA: Insufficient documentation

## 2024-06-20 DIAGNOSIS — C787 Secondary malignant neoplasm of liver and intrahepatic bile duct: Secondary | ICD-10-CM | POA: Insufficient documentation

## 2024-06-20 DIAGNOSIS — C799 Secondary malignant neoplasm of unspecified site: Secondary | ICD-10-CM

## 2024-06-20 DIAGNOSIS — Z1732 Human epidermal growth factor receptor 2 negative status: Secondary | ICD-10-CM | POA: Insufficient documentation

## 2024-06-20 LAB — MANUAL DIFFERENTIAL
BASOPHIL %: 1 % (ref 0–3)
BASOPHIL ABSOLUTE: 0.02 x10ˆ3/uL (ref 0.00–0.30)
BASOPHILS MANUAL: 1
EOSINOPHIL %: 4 % (ref 0–7)
EOSINOPHIL ABSOLUTE: 0.07 x10ˆ3/uL (ref 0.00–0.80)
EOSINOPHILS MANUAL: 4
LYMPHOCYTE %: 22 % — ABNORMAL LOW (ref 25–45)
LYMPHOCYTE ABSOLUTE: 0.4 x10ˆ3/uL — ABNORMAL LOW (ref 1.10–5.00)
LYMPHOCYTES MANUAL: 22
MONOCYTE %: 5 % (ref 0–12)
MONOCYTE ABSOLUTE: 0.09 x10ˆ3/uL (ref 0.00–0.30)
MONOCYTES MANUAL: 5
NEUTROPHIL %: 68 % (ref 40–76)
NEUTROPHIL ABSOLUTE: 1.22 x10ˆ3/uL — ABNORMAL LOW (ref 1.80–8.40)
NEUTROPHILS MANUAL: 68
PLATELET MORPHOLOGY COMMENT: ADEQUATE
RBC MORPHOLOGY COMMENT: NORMAL
SCHISTOCYTES: ABSENT
TOTAL CELLS COUNTED [#] IN BLOOD: 100
WBC: 1.8 x10ˆ3/uL

## 2024-06-20 LAB — COMPREHENSIVE METABOLIC PANEL, NON-FASTING
ALBUMIN/GLOBULIN RATIO: 2 — ABNORMAL HIGH (ref 0.8–1.4)
ALBUMIN: 4.3 g/dL (ref 3.5–5.7)
ALKALINE PHOSPHATASE: 72 U/L (ref 34–104)
ALT (SGPT): 16 U/L (ref 7–52)
ANION GAP: 5 mmol/L (ref 4–13)
AST (SGOT): 35 U/L (ref 13–39)
BILIRUBIN TOTAL: 0.9 mg/dL (ref 0.3–1.0)
BUN/CREA RATIO: 14 (ref 6–22)
BUN: 12 mg/dL (ref 7–25)
CALCIUM, CORRECTED: 8.8 mg/dL — ABNORMAL LOW (ref 8.9–10.8)
CALCIUM: 9 mg/dL (ref 8.6–10.3)
CHLORIDE: 108 mmol/L — ABNORMAL HIGH (ref 98–107)
CO2 TOTAL: 26 mmol/L (ref 21–31)
CREATININE: 0.87 mg/dL (ref 0.60–1.30)
ESTIMATED GFR: 74 mL/min/1.73mˆ2 (ref >59)
GLOBULIN: 2.1 (ref 2.0–3.5)
GLUCOSE: 107 mg/dL (ref 74–109)
OSMOLALITY, CALCULATED: 278 mosm/kg (ref 270–290)
POTASSIUM: 4.4 mmol/L (ref 3.5–5.1)
PROTEIN TOTAL: 6.4 g/dL (ref 6.4–8.9)
SODIUM: 139 mmol/L (ref 136–145)

## 2024-06-20 LAB — CBC
HCT: 35.7 % (ref 31.2–41.9)
HGB: 12.8 g/dL (ref 10.9–14.3)
MCH: 39.1 pg — ABNORMAL HIGH (ref 24.7–32.8)
MCHC: 35.9 g/dL — ABNORMAL HIGH (ref 32.3–35.6)
MCV: 109 fL — ABNORMAL HIGH (ref 75.5–95.3)
MPV: 9.7 fL (ref 7.9–10.8)
PLATELETS: 112 x10ˆ3/uL — ABNORMAL LOW (ref 140–440)
RBC: 3.27 x10ˆ6/uL — ABNORMAL LOW (ref 3.63–4.92)
RDW: 14.1 % (ref 12.3–17.7)
WBC: 1.8 x10ˆ3/uL — CL (ref 3.8–11.8)

## 2024-06-20 SURGERY — INSERTION PORT SUBCUTANEOUS
Anesthesia: Monitor Anesthesia Care | Site: Chest | Wound class: Clean Wound: Uninfected operative wounds in which no inflammation occurred

## 2024-06-20 MED ORDER — HEPARIN (PORCINE) 5,000 UNIT/ML INJECTION SOLUTION
INTRAMUSCULAR | Status: AC
Start: 2024-06-20 — End: 2024-06-20
  Filled 2024-06-20: qty 2

## 2024-06-20 MED ORDER — SODIUM CHLORIDE 0.9 % (FLUSH) INJECTION SYRINGE
3.0000 mL | INJECTION | Freq: Three times a day (TID) | INTRAMUSCULAR | Status: DC
Start: 2024-06-20 — End: 2024-06-20

## 2024-06-20 MED ORDER — HEPARIN (PORCINE) 5,000 UNIT/ML INJECTION SOLUTION
Freq: Once | INTRAMUSCULAR | Status: DC | PRN
Start: 2024-06-20 — End: 2024-06-20
  Administered 2024-06-20: 5000 [IU] via INTRAMUSCULAR

## 2024-06-20 MED ORDER — ONDANSETRON HCL (PF) 4 MG/2 ML INJECTION SOLUTION
4.0000 mg | Freq: Four times a day (QID) | INTRAMUSCULAR | Status: DC | PRN
Start: 2024-06-20 — End: 2024-06-20

## 2024-06-20 MED ORDER — FENTANYL (PF) 50 MCG/ML INJECTION WRAPPER
INJECTION | Freq: Once | INTRAMUSCULAR | Status: DC | PRN
Start: 2024-06-20 — End: 2024-06-20
  Administered 2024-06-20 (×2): 50 ug via INTRAVENOUS

## 2024-06-20 MED ORDER — LACTATED RINGERS INTRAVENOUS SOLUTION
INTRAVENOUS | Status: DC | PRN
Start: 2024-06-20 — End: 2024-06-20
  Administered 2024-06-20: 0 via INTRAVENOUS

## 2024-06-20 MED ORDER — ONDANSETRON HCL (PF) 4 MG/2 ML INJECTION SOLUTION
4.0000 mg | Freq: Once | INTRAMUSCULAR | Status: DC | PRN
Start: 2024-06-20 — End: 2024-06-20

## 2024-06-20 MED ORDER — SODIUM CHLORIDE 0.9 % INTRAVENOUS SOLUTION
INTRAVENOUS | Status: AC
Start: 2024-06-20 — End: 2024-06-20
  Filled 2024-06-20: qty 50

## 2024-06-20 MED ORDER — MIDAZOLAM 5 MG/ML INJECTION WRAPPER
INTRAMUSCULAR | Status: AC
Start: 2024-06-20 — End: 2024-06-20
  Filled 2024-06-20: qty 1

## 2024-06-20 MED ORDER — LACTATED RINGERS INTRAVENOUS SOLUTION
INTRAVENOUS | Status: DC
Start: 2024-06-20 — End: 2024-06-20

## 2024-06-20 MED ORDER — ROPIVACAINE (PF) 2 MG/ML (0.2 %) INJECTION SOLUTION
Freq: Once | INTRAMUSCULAR | Status: DC | PRN
Start: 2024-06-20 — End: 2024-06-20
  Administered 2024-06-20: 10 mL via INTRAMUSCULAR
  Administered 2024-06-20: 18 mL via INTRAMUSCULAR

## 2024-06-20 MED ORDER — IPRATROPIUM 0.5 MG-ALBUTEROL 3 MG (2.5 MG BASE)/3 ML NEBULIZATION SOLN
3.0000 mL | INHALATION_SOLUTION | Freq: Once | RESPIRATORY_TRACT | Status: DC | PRN
Start: 2024-06-20 — End: 2024-06-20

## 2024-06-20 MED ORDER — PROPOFOL 10 MG/ML IV BOLUS
INJECTION | Freq: Once | INTRAVENOUS | Status: DC | PRN
Start: 2024-06-20 — End: 2024-06-20
  Administered 2024-06-20 (×4): 50 mg via INTRAVENOUS

## 2024-06-20 MED ORDER — ROPIVACAINE (PF) 2 MG/ML (0.2 %) INJECTION SOLUTION
INTRAMUSCULAR | Status: AC
Start: 2024-06-20 — End: 2024-06-20
  Filled 2024-06-20: qty 20

## 2024-06-20 MED ORDER — DEXTROSE 5% IN WATER (D5W) FLUSH BAG - 250 ML
INTRAVENOUS | Status: DC | PRN
Start: 2024-06-20 — End: 2024-06-20

## 2024-06-20 MED ORDER — FENTANYL (PF) 50 MCG/ML INJECTION WRAPPER
25.0000 ug | INJECTION | INTRAMUSCULAR | Status: DC | PRN
Start: 2024-06-20 — End: 2024-06-20

## 2024-06-20 MED ORDER — ONDANSETRON HCL (PF) 4 MG/2 ML INJECTION SOLUTION
INTRAMUSCULAR | Status: AC
Start: 2024-06-20 — End: 2024-06-20
  Filled 2024-06-20: qty 2

## 2024-06-20 MED ORDER — FAMOTIDINE (PF) 20 MG/2 ML INTRAVENOUS SOLUTION
20.0000 mg | Freq: Once | INTRAVENOUS | Status: AC
Start: 2024-06-20 — End: 2024-06-20
  Administered 2024-06-20: 20 mg via INTRAVENOUS

## 2024-06-20 MED ORDER — CEFAZOLIN 2 GRAM INTRAVENOUS SOLUTION
INTRAVENOUS | Status: AC
Start: 2024-06-20 — End: 2024-06-20
  Filled 2024-06-20: qty 14.71

## 2024-06-20 MED ORDER — FAMOTIDINE (PF) 20 MG/2 ML INTRAVENOUS SOLUTION
INTRAVENOUS | Status: AC
Start: 2024-06-20 — End: 2024-06-20
  Filled 2024-06-20: qty 2

## 2024-06-20 MED ORDER — CEFAZOLIN 1 GRAM SOLUTION FOR INJECTION
Freq: Once | INTRAMUSCULAR | Status: DC | PRN
Start: 2024-06-20 — End: 2024-06-20
  Administered 2024-06-20: 2000 mg via INTRAVENOUS

## 2024-06-20 MED ORDER — FENTANYL (PF) 50 MCG/ML INJECTION WRAPPER
50.0000 ug | INJECTION | INTRAMUSCULAR | Status: DC | PRN
Start: 2024-06-20 — End: 2024-06-20

## 2024-06-20 MED ORDER — ETHYL ALCOHOL 62 % TOPICAL SWAB
1.0000 | Freq: Once | CUTANEOUS | Status: AC
Start: 2024-06-20 — End: 2024-06-20
  Administered 2024-06-20: 1 via NASAL

## 2024-06-20 MED ORDER — SODIUM CHLORIDE 0.9 % (FLUSH) INJECTION SYRINGE
3.0000 mL | INJECTION | INTRAMUSCULAR | Status: DC | PRN
Start: 2024-06-20 — End: 2024-06-20

## 2024-06-20 MED ORDER — HYDROCODONE 5 MG-ACETAMINOPHEN 325 MG TABLET
1.0000 | ORAL_TABLET | ORAL | Status: DC | PRN
Start: 2024-06-20 — End: 2024-06-20

## 2024-06-20 MED ORDER — MIDAZOLAM 5 MG/ML INJECTION WRAPPER
Freq: Once | INTRAMUSCULAR | Status: DC | PRN
Start: 2024-06-20 — End: 2024-06-20
  Administered 2024-06-20 (×2): 2.5 mg via INTRAVENOUS

## 2024-06-20 MED ORDER — FENTANYL (PF) 50 MCG/ML INJECTION SOLUTION
INTRAMUSCULAR | Status: AC
Start: 2024-06-20 — End: 2024-06-20
  Filled 2024-06-20: qty 2

## 2024-06-20 MED ORDER — SODIUM CHLORIDE 0.9% FLUSH BAG - 250 ML
INTRAVENOUS | Status: DC | PRN
Start: 2024-06-20 — End: 2024-06-20

## 2024-06-20 MED ORDER — ALBUTEROL SULFATE 2.5 MG/3 ML (0.083 %) SOLUTION FOR NEBULIZATION
2.5000 mg | INHALATION_SOLUTION | Freq: Once | RESPIRATORY_TRACT | Status: DC | PRN
Start: 2024-06-20 — End: 2024-06-20

## 2024-06-20 MED ORDER — ONDANSETRON HCL (PF) 4 MG/2 ML INJECTION SOLUTION
4.0000 mg | Freq: Once | INTRAMUSCULAR | Status: AC
Start: 2024-06-20 — End: 2024-06-20
  Administered 2024-06-20: 4 mg via INTRAVENOUS

## 2024-06-20 MED ORDER — PROCHLORPERAZINE EDISYLATE 10 MG/2 ML (5 MG/ML) INJECTION SOLUTION
5.0000 mg | Freq: Once | INTRAMUSCULAR | Status: DC | PRN
Start: 2024-06-20 — End: 2024-06-20

## 2024-06-20 SURGICAL SUPPLY — 24 items
ADH LIQUID LF  WTPRF VIAL PREP NONSTAIN MASTISOL STYRAX GUM MASTIC ALC MTHY SLCYT STRL CLR NHZR 2/3 (WOUND CARE SUPPLY) ×1 IMPLANT
BLADE 15 2 END CBNSTL SURG STRL DISP (SURGICAL CUTTING SUPPLIES) ×1 IMPLANT
CLEANER INSTRUMENT PRE-KLENZ 13.5 OZ (MISCELLANEOUS PT CARE ITEMS) ×1 IMPLANT
COUNTER 20 CNT BLOCK ADH NEEDLE STRL LF  RD SHARP FOAM 15.75X11.5X14IN DISP (MED SURG SUPPLIES) ×1 IMPLANT
COVER 53X24IN MAYOSTAND PRXM STRL DISP EQP SMS LF (DRAPE/PACKS/SHEETS/OR TOWEL) ×1 IMPLANT
COVER EQP 90X60IN HVDTY BCK PAD FNFLD BLU STRL (DRAPE/PACKS/SHEETS/OR TOWEL) ×2 IMPLANT
DCNTR FLUID DISPENSR BAG BAJ DISP STRL LF  ASPT TRANSF (IV TUBING & ACCESSORIES) ×1 IMPLANT
DRAPE SURG KC100 LAP CHOLE REINF ARMBRD CVR HKLP TUBE HLDR FEN 104X76X120IN STD BSC STRL (DRAPE/PACKS/SHEETS/OR TOWEL) ×1 IMPLANT
ELECTRODE PATIENT RTN 9FT VLAB C30- LB RM PHSV ACRL FOAM CORD NONIRRITATE NONSENSITIZE ADH STRP (SURGICAL CUTTING SUPPLIES) IMPLANT
GLOVE SURG 7 LF  PF BEAD CUF STRL CRM 11.8IN PROTEXIS PI PLISPRN THK9.1 MIL (GLOVES AND ACCESSORIES) ×1 IMPLANT
GLOVE SURG 7.5 LTX PF SMOOTH BEAD CUF STRL YW 12IN PROTEXIS (GLOVES AND ACCESSORIES) ×1 IMPLANT
GOWN SURG LRG STD LGTH L3 HKLP CLSR RGLN SLEEVE TWL STRL LF  DISP GRN AERO BLU PRFRM FBRC (DRAPE/PACKS/SHEETS/OR TOWEL) ×1 IMPLANT
GOWN SURG XL STD LGTH L3 HKLP CLSR RGLN SLEEVE TWL STRL LF  DISP GRN AERO BLU PRFRM FBRC (DRAPE/PACKS/SHEETS/OR TOWEL) ×1 IMPLANT
LABEL MED CORRECT MED LABELING SYS 4 FLG 2 SHEET 24 PRPRNT STRL (MED SURG SUPPLIES) ×1 IMPLANT
NEEDLE HYPO  23GA 1.5IN TW PRCSNGL SS POLYPROP REG BVL LL HUB DEHP-FR TRQS STRL LF  DISP (MED SURG SUPPLIES) ×2 IMPLANT
PORT IMPL INFUS POWERPORT ISP MRI ARGD CHRNFLX 8FR 1 LUM ATTACH CATH INTMD KIT SIL FIL SUT PLUG STRL ×1 IMPLANT
SLEEVE COMPRESS MED KNEE LGTH KENDALL SCD SEQ NONST LF  DISP 21- IN DVT PE (MED SURG SUPPLIES) IMPLANT
SOL IV 0.9% NACL 1000ML STRL PRSV FR FLXB CONTAINR LF (MEDICATIONS/SOLUTIONS) ×1 IMPLANT
SPONGE GAUZE 4X4IN AV GZ CLU COTTON ABS NWVN POSTOP LF  STRL DISP (WOUND CARE SUPPLY) ×1 IMPLANT
SPONGE SURG 4X4IN 16 PLY XRY DETECT COTTON STRL LF  DISP (WOUND CARE SUPPLY) ×1 IMPLANT
STRIP 4X.5IN STRSTRP PLSTR REINF SKNCLS WHT STRL LF (WOUND CARE SUPPLY) ×1 IMPLANT
SUTURE 4-0 PS2 MONOCRYL MTPS 27IN UNDYED MONOF ABS (SUTURE/WOUND CLOSURE) ×1 IMPLANT
SYRINGE LL 10ML LF  STRL GRAD N-PYRG DEHP-FR PVC FREE MED DISP (MED SURG SUPPLIES) ×3 IMPLANT
TOWEL 24X16IN COTTON BLU DISP SURG STRL LF (DRAPE/PACKS/SHEETS/OR TOWEL) ×1 IMPLANT

## 2024-06-20 NOTE — Nursing Note (Signed)
 Went over latest scan. Went over NCCN guidelines. All questions answered. Chemo consent signed, information given and side effects explained. Patient to stop kisqali . Hoping to get started next week.

## 2024-06-20 NOTE — Anesthesia Postprocedure Evaluation (Signed)
 Anesthesia Post Op Evaluation    Patient: Melanie Snyder  Procedure(s):  PORTACATH INSERTION    Last Vitals:Temperature: 36.1 C (97 F) (06/20/24 1230)  Heart Rate: 84 (06/20/24 1230)  BP (Non-Invasive): 111/62 (06/20/24 1230)  Respiratory Rate: (!) 21 (06/20/24 1230)  SpO2: 95 % (06/20/24 1230)    No notable events documented.    Patient is sufficiently recovered from the effects of anesthesia to participate in the evaluation and has returned to their pre-procedure level.  Patient location during evaluation: PACU       Patient participation: complete - patient participated  Level of consciousness: awake and alert and responsive to verbal stimuli    Pain score: 0  Pain management: adequate  Airway patency: patent    Anesthetic complications: no  Cardiovascular status: acceptable  Respiratory status: acceptable  Hydration status: acceptable  Patient post-procedure temperature: Pt Normothermic   PONV Status: Absent

## 2024-06-20 NOTE — H&P (Signed)
 [facility]  General Surgery  History and Physical    Date of Service:  06/20/2024  Kayse, Puccini, 68 y.o. female  Date of Admission:  06/20/2024  Date of Birth:  19-Jun-1956  PCP: Etta Caldron Hill-Hurt, PA-C    Reason for admission:  Port placement    HPI:  Melanie Snyder is a 68 y.o. White female who presents for a port placement secondary to planned advancement of chemotherapy secondary to metastatic breast cancer.  She has had a previous port placed by me on the left which was subsequently removed at a later time.  She has no specific complaints today.    Past Medical History:   Diagnosis Date    Breast CA     RIGHT CHEMO/RADIATION    Breast cancer 01/11/2022    Cardiac arrhythmia     Hyperlipidemia     Hypothyroidism     Malignant neoplasm of right female breast     Osteopenia       Past Surgical History:   Procedure Laterality Date    CESAREAN SECTION N/A     HX BREAST BIOPSY Right     6 months of chemo and radiation after that    HX BREAST LUMPECTOMY Right     CHEMO/RADIATION    HX BREAST REDUCTION Left       Social History[1]    Family Medical History:       Problem Relation (Age of Onset)    Cancer Mother, Maternal Grandfather    Coronary Artery Disease Paternal Aunt, Paternal Uncle    No Known Problems Father, Sister, Brother, Maternal Grandmother, Paternal Grandmother, Paternal Grandfather, Daughter, Son, Maternal Aunt, Maternal Uncle, Other           Medications Prior to Admission       Prescriptions    calcium carbonate/vitamin D3 (CALTRATE-600 PLUS VITAMIN D3 ORAL)    Take 1 Tablet by mouth Daily    cetirizine (ZYRTEC) 10 mg Oral Tablet    Take 1 Tablet (10 mg total) by mouth Once per day as needed    famotidine  (PEPCID ) 20 mg Oral Tablet    Take 1 Tablet (20 mg total) by mouth Every evening    HYDROcodone -acetaminophen  (NORCO) 5-325 mg Oral Tablet    Take 1 Tablet by mouth Every 6 hours as needed for Pain for up to 30 days    levothyroxine (SYNTHROID) 112 mcg Oral Tablet    Take  1 Tablet (112 mcg total) by mouth Every morning    lidocaine -prilocaine (EMLA) 2.5-2.5 % Cream    APPLY CREAM TOPICALLY TO AFFECTED AREA TWICE DAILY AS NEEDED    meloxicam (MOBIC) 15 mg Oral Tablet    Take 1 Tablet (15 mg total) by mouth Daily    ondansetron  (ZOFRAN  ODT) 8 mg Oral Tablet, Rapid Dissolve    Place 1 Tablet (8 mg total) under the tongue Every 8 hours as needed for Nausea/Vomiting    pravastatin (PRAVACHOL) 40 mg Oral Tablet    Take 1 Tablet (40 mg total) by mouth Daily    ribociclib  (KISQALI ) 600 mg/day (200 mg x 3) Oral Tablet    Take 3 Tablets (600 mg total) by mouth Daily for 21 days followed by 7 days off. Repeat every 28 days.           Allergies[2]       Patient Vitals for the past 24 hrs:   BP Temp Pulse Resp SpO2 Height Weight   06/20/24 0955 ROLLEN)  147/68 36.7 C (98 F) 89 15 95 % 1.6 m (5' 3) 97.1 kg (214 lb)          General: appropriate for age. in no acute distress.    Vital signs are present above and have been reviewed by me     HEENT: Atraumatic, Normocephalic.    Lungs: Nonlabored breathing with symmetric expansion    Heart:Regular wth respect to rate and rythmn.    Abdomen:Soft. Nontender. Nondistended     Psychiatric: Alert and oriented to person, place, and time. affect appropriate    Laboratory Data:     Results for orders placed or performed during the hospital encounter of 06/20/24 (from the past 24 hours)   COMPREHENSIVE METABOLIC PANEL, NON-FASTING   Result Value Ref Range    SODIUM 139 136 - 145 mmol/L    POTASSIUM 4.4 3.5 - 5.1 mmol/L    CHLORIDE 108 (H) 98 - 107 mmol/L    CO2 TOTAL 26 21 - 31 mmol/L    ANION GAP 5 4 - 13 mmol/L    BUN 12 7 - 25 mg/dL    CREATININE 9.12 9.39 - 1.30 mg/dL    BUN/CREA RATIO 14 6 - 22    ESTIMATED GFR 74 >59 mL/min/1.59m^2    ALBUMIN 4.3 3.5 - 5.7 g/dL    CALCIUM 9.0 8.6 - 89.6 mg/dL    GLUCOSE 892 74 - 890 mg/dL    ALKALINE PHOSPHATASE 72 34 - 104 U/L    ALT (SGPT) 16 7 - 52 U/L    AST (SGOT) 35 13 - 39 U/L    BILIRUBIN TOTAL 0.9 0.3 - 1.0 mg/dL     PROTEIN TOTAL 6.4 6.4 - 8.9 g/dL    ALBUMIN/GLOBULIN RATIO 2.0 (H) 0.8 - 1.4    OSMOLALITY, CALCULATED 278 270 - 290 mOsm/kg    CALCIUM, CORRECTED 8.8 (L) 8.9 - 10.8 mg/dL    GLOBULIN 2.1 2.0 - 3.5   CBC   Result Value Ref Range    WBC 1.8 (LL) 3.8 - 11.8 x10^3/uL    RBC 3.27 (L) 3.63 - 4.92 x10^6/uL    HGB 12.8 10.9 - 14.3 g/dL    HCT 64.2 68.7 - 58.0 %    MCV 109.0 (H) 75.5 - 95.3 fL    MCH 39.1 (H) 24.7 - 32.8 pg    MCHC 35.9 (H) 32.3 - 35.6 g/dL    RDW 85.8 87.6 - 82.2 %    PLATELETS 112 (L) 140 - 440 x10^3/uL    MPV 9.7 7.9 - 10.8 fL   MANUAL DIFFERENTIAL   Result Value Ref Range    WBC 1.8 x10^3/uL    NEUTROPHIL % 68 40 - 76 %    LYMPHOCYTE % 22 (L) 25 - 45 %    MONOCYTE % 5 0 - 12 %    EOSINOPHIL % 4 0 - 7 %    BASOPHIL % 1 0 - 3 %    METAMYELOCYTE %      MYELOCYTE %      PROMYELOCYTE %      BAND %      BLAST %      OTHER %      NEUTROPHIL ABSOLUTE 1.22 (L) 1.80 - 8.40 x10^3/uL    LYMPHOCYTE ABSOLUTE 0.40 (L) 1.10 - 5.00 x10^3/uL    MONOCYTE ABSOLUTE 0.09 0.00 - 0.30 x10^3/uL    EOSINOPHIL ABSOLUTE 0.07 0.00 - 0.80 x10^3/uL    BASOPHIL ABSOLUTE 0.02 0.00 - 0.30 x10^3/uL  METAMYELOCYTE ABSOLUTE      MYELOCYTE ABSOLUTE      PROMYELOCYTE ABSOLUTE      BLAST ABSOLUTE      OTHER CELL ABSOLUTE      ANISOCYTOSIS      HYPOCHROMASIA      POLYCHROMASIA      BASOPHILIC STIPPLING      MICROCYTOSIS      MACROCYTOSIS      ROULEAUX      SCHISTOCYTES Absent     SPHEROCYTES      TARGET CELLS      TEARDROP CELLS      OVALOCYTE (ELLIPTOCYTE)      CRENATED RED CELLS      STOMATOCYTES      ACANTHOCYTES (SPUR CELL)      ECHINOCYTE (BURR CELL)      BITE CELLS      BLISTER CELLS      SICKLE CELLS      PAPPENHEIMER BODIES      RBC AGGLUTINATES      HOWELL JOLLY BODIES      ATYPICAL LYMPHOCYTES      TOXIC GRANULATION      DOHLE BODIES      TOXIC VACUOLIZATION      AUER RODS      BASKET CELLS      HYPERSEGMENTATION      HYPOGRANULATION      HYPOSEGMENTED NEUTROPHIL      MICROORGANISMS      LARGE PLATELETS      PLATELET CLUMPS       PLATELET SATELLITOSIS      RBC MORPHOLOGY COMMENT Normal     PLATELET MORPHOLOGY COMMENT Adequate     BANDS NEUTROPHILS MANUAL      BAND ABSOLUTE      NEUTROPHILS MANUAL 68     LYMPHOCYTES MANUAL 22     MONOCYTES MANUAL 5     EOSINOPHILS MANUAL 4     BASOPHILS MANUAL 1     PROMYELOCYTES MANUAL      MYELOCYTES MANUAL      METAMYELOCYTES MANUAL      BLASTS MANUAL      TOTAL CELLS COUNTED [#] IN BLOOD 100     OTHER CELLS MANUAL      NUCLEATED RBC MANUAL      PLASMA CELL %      PLASMA CELL ABSOLUE      PLASMA CELLS MANUAL         Assessment/Plan:        Discussed risks and benefits of port placement with the patient.  Risks include pneumothorax, hemothorax, potential need for additional procedures, infection that may necessitate removal of the port.  Detailed informed consent was creasted for this procedure, reviewed in detail by the patient and signed/scanned into this chart.     This note was partially created using voice recognition software and is inherently subject to errors including those of syntax and sound alike  substitutions which may escape proof reading. In such instances, original meaning may be extrapolated by contextual derivation.    Alm DELENA Nam MD MBA CPE FACS           [1]   Social History  Tobacco Use    Smoking status: Never    Smokeless tobacco: Never   Vaping Use    Vaping status: Never Used   Substance Use Topics    Alcohol use: Never    Drug use: Never   [2]   Allergies  Allergen Reactions  Amoxicillin      Sore throat

## 2024-06-20 NOTE — Telephone Encounter (Signed)
 Kristen from the lab called a WBC of 1.8 on Melanie Snyder MRN [id number].   Results sent to Dr. Moses via secure chat.  Niels Sayre, MA

## 2024-06-20 NOTE — Anesthesia Preprocedure Evaluation (Signed)
 ANESTHESIA PRE-OP EVALUATION  Planned Procedure: PORTACATH INSERTION (Chest)  Review of Systems     anesthesia history negative     patient summary reviewed  nursing notes reviewed        Pulmonary  negative pulmonary ROS,    Cardiovascular    ECG reviewed and hyperlipidemia ,No peripheral edema and no dysrhythmias,  Exercise Tolerance: > or = 4 METS        GI/Hepatic/Renal    GERD and well controlled        Endo/Other    hypothyroidism, osteoarthritis and obesity,      Neuro/Psych/MS    back abnormality     Cancer    Right breast cancer,                     Physical Assessment      Airway       Mallampati: III    TM distance: 3 FB    Neck ROM: full  Mouth Opening: good.            Dental           (+) edentulous, upper dentures, lower dentures           Pulmonary    Breath sounds clear to auscultation  (-) no rhonchi, no decreased breath sounds, no wheezes, no rales and no stridor     Cardiovascular    Rhythm: regular  Rate: Normal  (-) no friction rub, carotid bruit is not present, no peripheral edema and no murmur     Other findings              Plan  ASA 3     Planned anesthesia type: MAC           PONV Plan:  I plan to administer pharmcologic prophalaxis antiemetics  POV PLAN:   plan for postoperative opioid use                Anesthesia issues/risks discussed are: Dental Injuries, Eye /Visual Loss, Nerve Injuries, PONV, Aspiration, Stroke, Difficult Airway, Cardiac Events/MI, Intraoperative Awareness/ Recall, Blood Loss and Sore Throat.  Anesthetic plan and risks discussed with patient  signed consent obtained          Patient's NPO status is appropriate for Anesthesia.           Plan discussed with CRNA.    (For MAC with GETA as back up)

## 2024-06-20 NOTE — Anesthesia Transfer of Care (Signed)
 ANESTHESIA TRANSFER OF CARE   Melanie Snyder is a 68 y.o. ,female, Weight: 97.1 kg (214 lb)   had Procedure(s):  PORTACATH INSERTION  performed  06/20/24   Primary Service: Alm Nam, MD    Past Medical History:   Diagnosis Date   . Breast CA     RIGHT CHEMO/RADIATION   . Breast cancer 01/11/2022   . Cardiac arrhythmia    . Hyperlipidemia    . Hypothyroidism    . Malignant neoplasm of right female breast    . Osteopenia       Allergy History as of 06/20/24       AMOXICILLIN         Noted Status Severity Type Reaction    06/20/24 0953 Zoe Doffing, RN 09/14/20 Active       Comments: Sore throat        11/03/22 1215 Mavis Kell, LPN 98/68/77 Active       Comments: Other Reaction(s): Not available     09/14/20 0936 Reatha Lowers, April, LPN 98/68/77 Active                     I completed my transfer of care / handoff to the receiving personnel during which we discussed:  Access, Airway, All key/critical aspects of case discussed, Analgesia, Antibiotics, Expectation of post procedure, Fluids/Product, Gave opportunity for questions and acknowledgement of understanding, Labs and PMHx      Post Location: PACU                                                           Last OR Temp: Temperature: 36.4 C (97.5 F)      Airway:* No LDAs found *  Blood pressure 107/61, pulse 94, temperature 36.4 C (97.5 F), resp. rate (!) 21, height 1.6 m (5' 3), weight 97.1 kg (214 lb), SpO2 99%.

## 2024-06-20 NOTE — Nurses Notes (Signed)
 Bedside hand off given to: Particia Rosella RN    Vitals Signs as follows: Temp: 97.6F                                        HR: 81                                        Resp: 18                                        BP: 124/56                                        O2: 97% on RA    Dressing Status: Dressing to left chest C/D/I    Peripheral IV:  22G left hand intact/infusing    Husband at bedside.

## 2024-06-20 NOTE — Progress Notes (Signed)
 Department of Hematology/Oncology  Progress Note   Name: Melanie Snyder  FMW:[id number]  Date of Birth: 1955-11-11  Encounter Date: 06/20/2024    REFERRING PROVIDER:  Steen Lenis, MD  [address]  [place],  [place] [address]    TELEMEDICINE DOCUMENTATION:  Patient Location:  [facility], [facility] outpatient Hematology/Oncology [address] [place] [address]  Patient/family aware of provider location:  yes  Patient/family consent for telemedicine:  yes    REASON FOR OFFICE VISIT:  Breast Cancer     HISTORY OF PRESENT ILLNESS:  Melanie Snyder is a 68 y.o. female who presents today for assistance with managing metastatic breast cancer.    The patient has a history of infiltrating ductal carcinoma of the right breast.  She was stage III at the time of initial diagnosis.  T3 N2 M0, grade 2.  ER positive and HER2 Neu negative.  She underwent adjuvant therapy with Adriamycin and Cytoxan followed by paclitaxel.  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      She had adjuvant radiation therapy, which completed in January of 2021.  She has been on treatment with adjuvant letrozole  since January of 2021.  Late last year she was found to have circulating tumor DNA present.  This was followed by an imaging study which showed a left humerus metastatic deposit as well as a possible lesion in the lumbar spine.    01/24/2024: The patient is here for follow up of metastatic breast cancer.  She states that she tried taking the Aromasin , but ended up having issues with the burning sensation around her growing and waist area.  She states that it was terrible and has discontinued that medication.  She has been taking the Kisqali  without any problems.    02/21/2024: The patient is here for follow up of metastatic breast cancer.  She is to begin treatment with Faslodex  and Kisqali  today.    03/20/2024: The patient is here for follow up of metastatic  breast cancer.  She has been on treatment with Faslodex  and Kisqali .  She states that she is tolerating treatment well, and her most recent Signatera test shows impressive improvement.    05/20/2024: The patient is here for follow up of metastatic breast cancer.  She is doing well lately.  She did have a relatively recent imaging study which had shown a possible lesion in the liver, although after changing treatment it appears that the lesion is no longer visible.  She has seen Radiation Oncology and they are monitoring the situation as well.    06/20/2024: the patient is here for follow-up of metastatic breast cancer. She is been on treatment with Faslodex  and Kisqali . She recently had an MRI of the abdomen which showed new hepatic metastases which were not present on the PET/CT scan that was performed a couple months ago.    ROS:   Pertinent review of systems as discussed in HPI    HISTORY:  Past Medical History:   Diagnosis Date    Breast CA     RIGHT CHEMO/RADIATION    Breast cancer 01/11/2022    Cardiac arrhythmia     Hyperlipidemia     Hypothyroidism     Malignant neoplasm of right female breast     Osteopenia          Past Surgical History:   Procedure Laterality Date    CESAREAN SECTION N/A     HX BREAST  BIOPSY Right     6 months of chemo and radiation after that    HX BREAST LUMPECTOMY Right     CHEMO/RADIATION    HX BREAST REDUCTION Left          Social History     Socioeconomic History    Marital status: Married     Spouse name: Not on file    Number of children: Not on file    Years of education: Not on file    Highest education level: Not on file   Occupational History    Not on file   Tobacco Use    Smoking status: Never    Smokeless tobacco: Never   Vaping Use    Vaping status: Never Used   Substance and Sexual Activity    Alcohol use: Never    Drug use: Never    Sexual activity: Not Currently   Other Topics Concern    Not on file   Social History Narrative    Not on file     Social Determinants of  Health     Financial Resource Strain: Not on file   Transportation Needs: Not on file   Social Connections: Not on file   Intimate Partner Violence: Not on file   Housing Stability: Not on file     Family Medical History:       Problem Relation (Age of Onset)    Cancer Mother, Maternal Grandfather    Coronary Artery Disease Paternal Aunt, Paternal Uncle    No Known Problems Father, Sister, Brother, Maternal Grandmother, Paternal Grandmother, Paternal Grandfather, Daughter, Son, Maternal Aunt, Maternal Uncle, Other            Current Outpatient Medications   Medication Sig    calcium carbonate/vitamin D3 (CALTRATE-600 PLUS VITAMIN D3 ORAL) Caltrate 600 plus D    cetirizine (ZYRTEC) 10 mg Oral Tablet Take 1 Tablet (10 mg total) by mouth Once per day as needed    famotidine  (PEPCID ) 20 mg Oral Tablet Take 1 Tablet (20 mg total) by mouth Every evening    HYDROcodone -acetaminophen  (NORCO) 5-325 mg Oral Tablet Take 1 Tablet by mouth Every 6 hours as needed for Pain for up to 30 days    levothyroxine (SYNTHROID) 112 mcg Oral Tablet     lidocaine -prilocaine (EMLA) 2.5-2.5 % Cream APPLY CREAM TOPICALLY TO AFFECTED AREA TWICE DAILY AS NEEDED    meloxicam (MOBIC) 15 mg Oral Tablet Take 1 Tablet (15 mg total) by mouth Daily    ondansetron  (ZOFRAN  ODT) 8 mg Oral Tablet, Rapid Dissolve Place 1 Tablet (8 mg total) under the tongue Every 8 hours as needed for Nausea/Vomiting    pravastatin (PRAVACHOL) 40 mg Oral Tablet Take 1 Tablet (40 mg total) by mouth     Allergies   Allergen Reactions    Amoxicillin      Other Reaction(s): Not available       PHYSICAL EXAM:  Most Recent Vitals    Flowsheet Row Office Visit from 12/28/2023 in Hematology/Oncology, [facility]   Temperature 36.8 C (98.2 F) filed at... 12/28/2023 0847   Heart Rate 105 filed at... 12/28/2023 0847   Respiratory Rate --   BP (Non-Invasive) 148/85 filed at... 12/28/2023 0847   SpO2 99 % filed at... 12/28/2023 0847   Height 1.6 m (5' 3) filed at... 12/28/2023  0847   Weight 96.8 kg (213 lb 6.4 oz) filed at... 12/28/2023 0847   BMI (Calculated) 37.88 filed at... 12/28/2023 9152  BSA (Calculated) 2.07 filed at... 12/28/2023 0847      ECOG Status: (0) Fully active, able to carry on all predisease performance without restriction   Physical Exam    DIAGNOSTIC DATA:  No results found for this or any previous visit (from the past 82479 hours).    LABS:   CBC  Diff   Lab Results   Component Value Date/Time    WBC 1.8 (LL) 06/20/2024 08:24 AM    WBC 1.8 06/20/2024 08:24 AM    HGB 12.8 06/20/2024 08:24 AM    HCT 35.7 06/20/2024 08:24 AM    PLTCNT 112 (L) 06/20/2024 08:24 AM    RBC 3.27 (L) 06/20/2024 08:24 AM    MCV 109.0 (H) 06/20/2024 08:24 AM    MCHC 35.9 (H) 06/20/2024 08:24 AM    MCH 39.1 (H) 06/20/2024 08:24 AM    RDW 14.1 06/20/2024 08:24 AM    MPV 9.7 06/20/2024 08:24 AM    Lab Results   Component Value Date/Time    PMNS 68 06/20/2024 08:24 AM    LYMPHOCYTES 22 (L) 06/20/2024 08:24 AM    EOSINOPHIL 4 06/20/2024 08:24 AM    MONOCYTES 5 06/20/2024 08:24 AM    BASOPHILS 1 06/20/2024 08:24 AM    PMNABS 1.20 (L) 03/20/2024 03:02 PM    LYMPHSABS 0.60 (L) 03/20/2024 03:02 PM    EOSABS 0.10 03/20/2024 03:02 PM    MONOSABS 0.20 03/20/2024 03:02 PM    BASABS 0.02 06/20/2024 08:24 AM            Comprehensive Metabolic Profile    Lab Results   Component Value Date    SODIUM 139 06/20/2024    POTASSIUM 4.4 06/20/2024    CHLORIDE 108 (H) 06/20/2024    CO2 26 06/20/2024    ANIONGAP 5 06/20/2024    BUN 12 06/20/2024    CREATININE 0.87 06/20/2024    ALBUMIN 4.3 06/20/2024    CALCIUM 9.0 06/20/2024    GLUCOSENF 107 06/20/2024    ALKPHOS 72 06/20/2024    ALT 16 06/20/2024    AST 35 06/20/2024    TOTBILIRUBIN 0.9 06/20/2024    TOTALPROTEIN 6.4 06/20/2024          BASIC METABOLIC PANEL  Lab Results   Component Value Date    SODIUM 139 06/20/2024    POTASSIUM 4.4 06/20/2024    CHLORIDE 108 (H) 06/20/2024    CO2 26 06/20/2024    ANIONGAP 5 06/20/2024    BUN 12 06/20/2024    CREATININE 0.87  06/20/2024    BUNCRRATIO 14 06/20/2024    GFR 74 06/20/2024    CALCIUM 9.0 06/20/2024    GLUCOSENF 107 06/20/2024           ASSESSMENT:  Problem List Items Addressed This Visit          Oncology    Breast cancer - Primary    Relevant Orders    BLOOD CELL COUNT W/DIFF - CANCER CENTER    COMPREHENSIVE METABOLIC PANEL, NON-FASTING    BLOOD CELL COUNT W/DIFF - CANCER CENTER    BLOOD CELL COUNT W/DIFF - CANCER CENTER    Referral to ONCOLOGY INFUSION - [place] - (Chemo, Hydration, Injection, Infusion, Pheresis, Photopheresis))        ICD-10-CM    1. Breast cancer  C50.919 BLOOD CELL COUNT W/DIFF - CANCER CENTER     COMPREHENSIVE METABOLIC PANEL, NON-FASTING     BLOOD CELL COUNT W/DIFF - CANCER CENTER     BLOOD CELL COUNT  W/DIFF - CANCER CENTER     Referral to ONCOLOGY INFUSION - [place] - (Chemo, Hydration, Injection, Infusion, Pheresis, Photopheresis))         TREATMENT HISTORY:  Letrozole   Exemestane /palbociclib (did not tolerate exemestane )  Fulvestrant /palbociclib (with plan to add Inavoisib in the near future)    PLAN:   1. All relevant medical records were reviewed including available pertinent provider notes, procedure notes, imaging, laboratory, and pathology.   2. All pertinent labs and/or imaging were reviewed with the patient.   3. 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  ER positive, HER2 Neu negative.  She has developed a recurrence manifesting as letrozole  failure.  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 We reviewed the NCCN guidelines. At this time, we will discontinue efforts at hormone blockade and instead switch to chemotherapy. We reviewed the potential treatment options and she states that paclitaxel was very difficult for her to tolerate a weekly dose previously. She states that she  tolerated doxorubicin extremely well, but we discussed that there is a lifetime amount that a person can receive and she would only be able to receive potentially to cycles. I think a good option would be Abraxane. It is in the class of tax liens which is the most effective class of drugs for treating breast cancer. It should also have lower side effects. She is agreeable to moving forward with that, so we will try to initiate treatment next week. She inquired about future efforts at hormone blockade, and I think that maybe that could be considered once she has very good control of her disease burden.    Nathanel LITTIE Holts was given the chance to ask questions, and these were answered to their satisfaction. The patient is welcome to call with any questions or concerns in the meantime.     On the day of the encounter, a total of 35 minutes was spent on this patient encounter including review of historical information, examination, documentation and post-visit activities.   Return in about 1 week (around 06/27/2024).     Lynwood Pipes, MD  06/20/2024 , 09:27  The patient was seen as part of a collaborative telemedicine service with Dr. Pipes who participated in the encounter by active presence via approved video/audio means for portions of the encounter.    The patient's insurance company bears full legal and financial responsibility resulting from any deviations that they cause to my recommended treatment plan.    CC:  Etta Caldron Franklin, PA-C  [address]  [place] [address]    Steen Lenis, MD  [address]  [place],  [place] [address]    This note was partially generated using MModal Fluency Direct system, and there may be some incorrect words, spellings, and punctuation that were not noted in checking the note before saving.

## 2024-06-20 NOTE — Discharge Instructions (Signed)
 Follow up with Dr. Steen as neededas needed    Follow up with oncologist/family doctor as scheduled    Do not remove steri strips.  They will come off on their own.    Unlimited ambulation    No heavy lifting or straining.    Resume home meds.

## 2024-06-20 NOTE — OR Surgeon (Signed)
 [facility]      Patient Name: Melanie Snyder, Melanie Snyder Number: [id number]  Date of Service: 06/20/2024   Date of Birth: 10/10/1955      Pre-Operative Diagnosis:  Stage IV Breast cancer     Post-Operative Diagnosis:  Same    Procedure(s)/Description:  PORTACATH INSERTION left subclavian with radiographic supervision and interpretation of fluoroscopy:      Attending Surgeon: Alm Nam, MD     Anesthesia:  Anesthesiologist: Horacio Rush, MD  CRNA: Gasper Jacks, CRNA    Anesthesia Type: .Monitor Anesthesia Care       Estimated Blood Loss:  <10 cc    The patient was brought to the operating suite and placed in supine position on the operating room table where anesthesia provided IV access as well as conscious sedation.  Once the patient was adequately sedated, the neck and chest were prepped and draped in usual sterile fashion in anticipation of insertion of port-A-cath.   Attention was first directed towards the patient's shoulder where the skin and subcutaneous tissues in infraclavicular position was injected with local analgesia followed by cannulation of the subclavian vein with an 18-gauge needle.  The return was nonpulsatile dark venous appearing blood.  This was  followed by passage of guide wire under fluoroscopy into the vena cava.  Once access to the venous system had been accomplished, a subcutaneous pocket was created approximately 2 cm inferior to the access point after local analgesia was accomplished.   Once the subcutaneous pocket was developed and noted to be appropriately sized for the catheter, a subcutaneous tract was developed between the guide wire and the pocket followed by passage of the catheter between the two points without difficulty.   The catheter was then connected to the hub of the port-A-cath followed by flushing of the line with heparinized saline and placement of the port-A-cath in its subcutaneous pocket.   The catheter tip was then cut to appropriate length  using fluoroscopy followed by passage of the dilator and pull-away sheath over the guide wire under fluoroscopic guidance into appropriate location.   The dilator and guide wire were then removed followed by passage of the catheter down the pull-away sheath and removal of the pull-away sheath.   The catheter was then aspirated with non pulsatile flow and flushed with ease followed by closure of the skin using subcuticular 4-0 Monocryl, both the subcutaneous pocket as well as the access point.   Fluoroscopy confirmed appropriate location of the catheter in the superior vena cava.  The catheter was aspirated and flushed with ease prior to closure.  The patient tolerated the procedure well; returned to postanesthesia care in stable condition.  Alm DELENA Nam MD MBA CPE FACS

## 2024-06-21 ENCOUNTER — Ambulatory Visit (INDEPENDENT_AMBULATORY_CARE_PROVIDER_SITE_OTHER): Payer: Self-pay | Admitting: HEMATOLOGY-ONCOLOGY

## 2024-06-21 ENCOUNTER — Other Ambulatory Visit: Payer: Self-pay

## 2024-06-21 ENCOUNTER — Telehealth (INDEPENDENT_AMBULATORY_CARE_PROVIDER_SITE_OTHER): Payer: Self-pay | Admitting: Surgery

## 2024-06-23 LAB — CA 27,29: CA 27.29: 110 U/mL — ABNORMAL HIGH (ref <38)

## 2024-06-25 ENCOUNTER — Encounter (HOSPITAL_COMMUNITY): Payer: Self-pay | Admitting: HEMATOLOGY-ONCOLOGY

## 2024-06-25 ENCOUNTER — Other Ambulatory Visit: Payer: Self-pay

## 2024-06-25 ENCOUNTER — Ambulatory Visit
Admission: RE | Admit: 2024-06-25 | Discharge: 2024-06-25 | Disposition: A | Discharge status: Home / Self Care | Source: Ambulatory Visit

## 2024-06-25 DIAGNOSIS — M810 Age-related osteoporosis without current pathological fracture: Secondary | ICD-10-CM | POA: Insufficient documentation

## 2024-06-25 DIAGNOSIS — M8580 Other specified disorders of bone density and structure, unspecified site: Secondary | ICD-10-CM

## 2024-06-26 ENCOUNTER — Telehealth (INDEPENDENT_AMBULATORY_CARE_PROVIDER_SITE_OTHER): Payer: Self-pay | Admitting: HEMATOLOGY-ONCOLOGY

## 2024-06-26 ENCOUNTER — Encounter (HOSPITAL_COMMUNITY): Payer: Self-pay | Admitting: HEMATOLOGY-ONCOLOGY

## 2024-06-27 ENCOUNTER — Encounter (HOSPITAL_COMMUNITY): Payer: Self-pay | Admitting: HEMATOLOGY-ONCOLOGY

## 2024-06-28 ENCOUNTER — Ambulatory Visit (INDEPENDENT_AMBULATORY_CARE_PROVIDER_SITE_OTHER): Payer: Self-pay | Admitting: HEMATOLOGY-ONCOLOGY

## 2024-06-28 ENCOUNTER — Other Ambulatory Visit: Payer: Self-pay

## 2024-06-28 ENCOUNTER — Encounter (HOSPITAL_COMMUNITY): Payer: Self-pay | Admitting: HEMATOLOGY-ONCOLOGY

## 2024-06-28 ENCOUNTER — Ambulatory Visit (INDEPENDENT_AMBULATORY_CARE_PROVIDER_SITE_OTHER): Payer: Self-pay

## 2024-07-01 ENCOUNTER — Ambulatory Visit (INDEPENDENT_AMBULATORY_CARE_PROVIDER_SITE_OTHER): Admitting: HEMATOLOGY-ONCOLOGY

## 2024-07-01 ENCOUNTER — Ambulatory Visit
Admission: RE | Admit: 2024-07-01 | Discharge: 2024-07-01 | Disposition: A | Discharge status: Home / Self Care | Payer: Self-pay | Source: Ambulatory Visit | Attending: HEMATOLOGY-ONCOLOGY | Admitting: HEMATOLOGY-ONCOLOGY

## 2024-07-01 ENCOUNTER — Encounter (INDEPENDENT_AMBULATORY_CARE_PROVIDER_SITE_OTHER): Payer: Self-pay | Admitting: HEMATOLOGY-ONCOLOGY

## 2024-07-01 ENCOUNTER — Other Ambulatory Visit: Payer: Self-pay

## 2024-07-01 ENCOUNTER — Ambulatory Visit (INDEPENDENT_AMBULATORY_CARE_PROVIDER_SITE_OTHER)
Admission: RE | Admit: 2024-07-01 | Discharge: 2024-07-01 | Disposition: A | Source: Ambulatory Visit | Attending: HEMATOLOGY-ONCOLOGY

## 2024-07-01 ENCOUNTER — Telehealth (INDEPENDENT_AMBULATORY_CARE_PROVIDER_SITE_OTHER): Payer: Self-pay | Admitting: HEMATOLOGY-ONCOLOGY

## 2024-07-01 VITALS — BP 145/71 | HR 84 | Temp 96.8°F | Ht 63.0 in | Wt 215.2 lb

## 2024-07-01 VITALS — BP 148/77 | HR 92 | Temp 98.2°F | Resp 17

## 2024-07-01 DIAGNOSIS — Z853 Personal history of malignant neoplasm of breast: Secondary | ICD-10-CM | POA: Insufficient documentation

## 2024-07-01 DIAGNOSIS — Z79811 Long term (current) use of aromatase inhibitors: Secondary | ICD-10-CM | POA: Insufficient documentation

## 2024-07-01 DIAGNOSIS — Z9011 Acquired absence of right breast and nipple: Secondary | ICD-10-CM | POA: Insufficient documentation

## 2024-07-01 DIAGNOSIS — Z9889 Other specified postprocedural states: Secondary | ICD-10-CM | POA: Insufficient documentation

## 2024-07-01 DIAGNOSIS — C787 Secondary malignant neoplasm of liver and intrahepatic bile duct: Secondary | ICD-10-CM | POA: Insufficient documentation

## 2024-07-01 DIAGNOSIS — C7951 Secondary malignant neoplasm of bone: Secondary | ICD-10-CM | POA: Insufficient documentation

## 2024-07-01 DIAGNOSIS — Z5112 Encounter for antineoplastic immunotherapy: Secondary | ICD-10-CM | POA: Insufficient documentation

## 2024-07-01 DIAGNOSIS — C50919 Malignant neoplasm of unspecified site of unspecified female breast: Secondary | ICD-10-CM

## 2024-07-01 LAB — MANUAL DIFFERENTIAL
BAND %: 5 % (ref 5–11)
BANDS NEUTROPHILS MANUAL: 5
BASOPHIL %: 2 % (ref 0–3)
BASOPHIL ABSOLUTE: 0.04 x10ˆ3/uL (ref 0.00–0.30)
BASOPHILS MANUAL: 2
EOSINOPHIL %: 3 % (ref 0–7)
EOSINOPHIL ABSOLUTE: 0.06 x10ˆ3/uL (ref 0.00–0.80)
EOSINOPHILS MANUAL: 3
LYMPHOCYTE %: 36 % (ref 25–45)
LYMPHOCYTE ABSOLUTE: 0.68 x10ˆ3/uL — ABNORMAL LOW (ref 1.10–5.00)
LYMPHOCYTES MANUAL: 36
MONOCYTE %: 6 % (ref 0–12)
MONOCYTE ABSOLUTE: 0.11 x10ˆ3/uL (ref 0.00–0.30)
MONOCYTES MANUAL: 6
NEUTROPHIL %: 48 % (ref 40–76)
NEUTROPHIL ABSOLUTE: 1.01 x10ˆ3/uL — ABNORMAL LOW (ref 1.80–8.40)
NEUTROPHILS MANUAL: 48
PLATELET MORPHOLOGY COMMENT: ADEQUATE
RBC MORPHOLOGY COMMENT: NORMAL
SCHISTOCYTES: ABSENT
TOTAL CELLS COUNTED [#] IN BLOOD: 100
WBC: 1.9 x10ˆ3/uL

## 2024-07-01 LAB — COMPREHENSIVE METABOLIC PANEL, NON-FASTING
ALBUMIN/GLOBULIN RATIO: 2 — ABNORMAL HIGH (ref 0.8–1.4)
ALBUMIN: 4.3 g/dL (ref 3.5–5.7)
ALKALINE PHOSPHATASE: 70 U/L (ref 34–104)
ALT (SGPT): 16 U/L (ref 7–52)
ANION GAP: 6 mmol/L (ref 4–13)
AST (SGOT): 32 U/L (ref 13–39)
BILIRUBIN TOTAL: 0.7 mg/dL (ref 0.3–1.0)
BUN/CREA RATIO: 17 (ref 6–22)
BUN: 15 mg/dL (ref 7–25)
CALCIUM, CORRECTED: 9 mg/dL (ref 8.9–10.8)
CALCIUM: 9.2 mg/dL (ref 8.6–10.3)
CHLORIDE: 106 mmol/L (ref 98–107)
CO2 TOTAL: 27 mmol/L (ref 21–31)
CREATININE: 0.9 mg/dL (ref 0.60–1.30)
ESTIMATED GFR: 71 mL/min/1.73mˆ2 (ref >59)
GLOBULIN: 2.2 (ref 2.0–3.5)
GLUCOSE: 104 mg/dL (ref 74–109)
OSMOLALITY, CALCULATED: 279 mosm/kg (ref 270–290)
POTASSIUM: 4.5 mmol/L (ref 3.5–5.1)
PROTEIN TOTAL: 6.5 g/dL (ref 6.4–8.9)
SODIUM: 139 mmol/L (ref 136–145)

## 2024-07-01 LAB — CBC WITH DIFF
HCT: 34.7 % (ref 31.2–41.9)
HGB: 12.4 g/dL (ref 10.9–14.3)
MCH: 38.8 pg — ABNORMAL HIGH (ref 24.7–32.8)
MCHC: 35.7 g/dL — ABNORMAL HIGH (ref 32.3–35.6)
MCV: 108.7 fL — ABNORMAL HIGH (ref 75.5–95.3)
MPV: 9.4 fL (ref 7.9–10.8)
PLATELETS: 139 x10ˆ3/uL — ABNORMAL LOW (ref 140–440)
RBC: 3.19 x10ˆ6/uL — ABNORMAL LOW (ref 3.63–4.92)
RDW: 14.3 % (ref 12.3–17.7)
WBC: 1.9 x10ˆ3/uL — CL (ref 3.8–11.8)

## 2024-07-01 MED ORDER — DEXTROSE 5% IN WATER (D5W) FLUSH BAG - 250 ML
INTRAVENOUS | Status: DC | PRN
Start: 2024-07-01 — End: 2024-07-02

## 2024-07-01 MED ORDER — MEPERIDINE (PF) 25 MG/ML INJECTION SOLUTION
12.5000 mg | Freq: Once | INTRAMUSCULAR | Status: DC | PRN
Start: 2024-07-01 — End: 2024-07-02

## 2024-07-01 MED ORDER — ALBUTEROL SULFATE 2.5 MG/3 ML (0.083 %) SOLUTION FOR NEBULIZATION
2.5000 mg | INHALATION_SOLUTION | Freq: Once | RESPIRATORY_TRACT | Status: DC | PRN
Start: 2024-07-01 — End: 2024-07-02

## 2024-07-01 MED ORDER — PROCHLORPERAZINE EDISYLATE 10 MG/2 ML (5 MG/ML) INJECTION SOLUTION
10.0000 mg | Freq: Once | INTRAMUSCULAR | Status: DC | PRN
Start: 2024-07-01 — End: 2024-07-02

## 2024-07-01 MED ORDER — PROCHLORPERAZINE MALEATE 10 MG TABLET
10.0000 mg | ORAL_TABLET | Freq: Once | ORAL | Status: DC | PRN
Start: 2024-07-01 — End: 2024-07-02

## 2024-07-01 MED ORDER — SODIUM CHLORIDE 0.9% FLUSH BAG - 250 ML
INTRAVENOUS | Status: DC | PRN
Start: 2024-07-01 — End: 2024-07-02

## 2024-07-01 MED ORDER — SODIUM CHLORIDE 0.9 % IV BOLUS
500.0000 mL | INJECTION | Freq: Once | Status: DC | PRN
Start: 2024-07-01 — End: 2024-07-02

## 2024-07-01 MED ORDER — PACLITAXEL-PROTEIN BOUND IVPB
100.0000 mg/m2 | INJECTION | Freq: Once | INTRAVENOUS | Status: AC
Start: 2024-07-01 — End: 2024-07-01
  Administered 2024-07-01: 0 mg via INTRAVENOUS
  Administered 2024-07-01: 200 mg via INTRAVENOUS
  Filled 2024-07-01: qty 40

## 2024-07-01 MED ORDER — DIPHENHYDRAMINE 50 MG/ML INJECTION SOLUTION
25.0000 mg | Freq: Once | INTRAMUSCULAR | Status: DC | PRN
Start: 2024-07-01 — End: 2024-07-02

## 2024-07-01 MED ORDER — DIPHENHYDRAMINE 50 MG/ML INJECTION SOLUTION
50.0000 mg | Freq: Once | INTRAMUSCULAR | Status: DC | PRN
Start: 2024-07-01 — End: 2024-07-02

## 2024-07-01 MED ORDER — ALBUTEROL SULFATE HFA 90 MCG/ACTUATION AEROSOL INHALER - RN
2.0000 | Freq: Once | RESPIRATORY_TRACT | Status: DC | PRN
Start: 2024-07-01 — End: 2024-07-02

## 2024-07-01 MED ORDER — HYDROCORTISONE SOD SUCCINATE 100 MG/2 ML VIAL WRAPPER
100.0000 mg | Freq: Once | INTRAMUSCULAR | Status: DC | PRN
Start: 2024-07-01 — End: 2024-07-02

## 2024-07-01 MED ORDER — FAMOTIDINE (PF) 20 MG/2 ML INTRAVENOUS SOLUTION
20.0000 mg | Freq: Once | INTRAVENOUS | Status: DC | PRN
Start: 2024-07-01 — End: 2024-07-02

## 2024-07-01 MED ORDER — ONDANSETRON HCL 8 MG TABLET
16.0000 mg | ORAL_TABLET | Freq: Once | ORAL | Status: AC
Start: 2024-07-01 — End: 2024-07-01
  Administered 2024-07-01: 16 mg via ORAL
  Filled 2024-07-01: qty 2

## 2024-07-01 MED ORDER — EPINEPHRINE 1 MG/ML (1 ML) INJECTION SOLUTION
0.3000 mg | Freq: Once | INTRAMUSCULAR | Status: DC | PRN
Start: 2024-07-01 — End: 2024-07-02

## 2024-07-01 NOTE — Progress Notes (Signed)
 Department of Hematology/Oncology  Progress Note   Name: Melanie Snyder  FMW:[id number]  Date of Birth: May 22, 1956  Encounter Date: 07/01/2024    REFERRING PROVIDER:  Steen Lenis, MD  [address]  [place],  [place] [address]    TELEMEDICINE DOCUMENTATION:  Patient Location:  [facility], [facility] outpatient Hematology/Oncology [address] [place] [address]  Patient/family aware of provider location:  yes  Patient/family consent for telemedicine:  yes    REASON FOR OFFICE VISIT:  Breast Cancer     HISTORY OF PRESENT ILLNESS:  Melanie Snyder is a 68 y.o. female who presents today for assistance with managing metastatic breast cancer.    The patient has a history of infiltrating ductal carcinoma of the right breast.  She was stage III at the time of initial diagnosis.  T3 N2 M0, grade 2.  ER positive and HER2 Neu negative.  She underwent adjuvant therapy with Adriamycin and Cytoxan followed by paclitaxel.  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      She had adjuvant radiation therapy, which completed in January of 2021.  She has been on treatment with adjuvant letrozole  since January of 2021.  Late last year she was found to have circulating tumor DNA present.  This was followed by an imaging study which showed a left humerus metastatic deposit as well as a possible lesion in the lumbar spine.    01/24/2024: The patient is here for follow up of metastatic breast cancer.  She states that she tried taking the Aromasin , but ended up having issues with the burning sensation around her growing and waist area.  She states that it was terrible and has discontinued that medication.  She has been taking the Kisqali  without any problems.    02/21/2024: The patient is here for follow up of metastatic breast cancer.  She is to begin treatment with Faslodex  and Kisqali  today.    03/20/2024: The patient is here for follow up of metastatic  breast cancer.  She has been on treatment with Faslodex  and Kisqali .  She states that she is tolerating treatment well, and her most recent Signatera test shows impressive improvement.    05/20/2024: The patient is here for follow up of metastatic breast cancer.  She is doing well lately.  She did have a relatively recent imaging study which had shown a possible lesion in the liver, although after changing treatment it appears that the lesion is no longer visible.  She has seen Radiation Oncology and they are monitoring the situation as well.    06/20/2024: the patient is here for follow-up of metastatic breast cancer. She is been on treatment with Faslodex  and Kisqali . She recently had an MRI of the abdomen which showed new hepatic metastases which were not present on the PET/CT scan that was performed a couple months ago.    07/01/2024: the patient is here for follow-up of metastatic breast cancer. She is motivated to initiate the new treatment today.    ROS:   Pertinent review of systems as discussed in HPI    HISTORY:  Past Medical History:   Diagnosis Date    Breast CA     RIGHT CHEMO/RADIATION    Breast cancer 01/11/2022    Cardiac arrhythmia     Hyperlipidemia     Hypothyroidism     Malignant neoplasm of right female breast     Osteopenia  Past Surgical History:   Procedure Laterality Date    CESAREAN SECTION N/A     HX BREAST BIOPSY Right     6 months of chemo and radiation after that    HX BREAST LUMPECTOMY Right     CHEMO/RADIATION    HX BREAST REDUCTION Left          Social History     Socioeconomic History    Marital status: Married     Spouse name: Not on file    Number of children: Not on file    Years of education: Not on file    Highest education level: Not on file   Occupational History    Not on file   Tobacco Use    Smoking status: Never    Smokeless tobacco: Never   Vaping Use    Vaping status: Never Used   Substance and Sexual Activity    Alcohol use: Never    Drug use: Never    Sexual  activity: Not Currently   Other Topics Concern    Not on file   Social History Narrative    Not on file     Social Determinants of Health     Financial Resource Strain: Not on file   Transportation Needs: Not on file   Social Connections: Not on file   Intimate Partner Violence: Not on file   Housing Stability: Not on file     Family Medical History:       Problem Relation (Age of Onset)    Cancer Mother, Maternal Grandfather    Coronary Artery Disease Paternal Aunt, Paternal Uncle    No Known Problems Father, Sister, Brother, Maternal Grandmother, Paternal Grandmother, Paternal Grandfather, Daughter, Son, Maternal Aunt, Maternal Uncle, Other            Current Outpatient Medications   Medication Sig    calcium carbonate/vitamin D3 (CALTRATE-600 PLUS VITAMIN D3 ORAL) Take 1 Tablet by mouth Daily    cetirizine (ZYRTEC) 10 mg Oral Tablet Take 1 Tablet (10 mg total) by mouth Once per day as needed    famotidine  (PEPCID ) 20 mg Oral Tablet Take 1 Tablet (20 mg total) by mouth Every evening    HYDROcodone -acetaminophen  (NORCO) 5-325 mg Oral Tablet Take 1 Tablet by mouth Every 6 hours as needed for Pain for up to 30 days    levothyroxine (SYNTHROID) 112 mcg Oral Tablet Take 1 Tablet (112 mcg total) by mouth Every morning    lidocaine -prilocaine (EMLA) 2.5-2.5 % Cream APPLY CREAM TOPICALLY TO AFFECTED AREA TWICE DAILY AS NEEDED    meloxicam (MOBIC) 15 mg Oral Tablet Take 1 Tablet (15 mg total) by mouth Daily    ondansetron  (ZOFRAN  ODT) 8 mg Oral Tablet, Rapid Dissolve Place 1 Tablet (8 mg total) under the tongue Every 8 hours as needed for Nausea/Vomiting    pravastatin (PRAVACHOL) 40 mg Oral Tablet Take 1 Tablet (40 mg total) by mouth Daily    ribociclib  (KISQALI ) 600 mg/day (200 mg x 3) Oral Tablet Take 3 Tablets (600 mg total) by mouth Daily for 21 days followed by 7 days off. Repeat every 28 days.     Allergies   Allergen Reactions    Amoxicillin      Sore throat          PHYSICAL EXAM:  Most Recent Vitals    Flowsheet  Row Office Visit from 12/28/2023 in Hematology/Oncology, [facility]   Temperature 36.8 C (98.2 F) filed at... 12/28/2023 9152  Heart Rate 105 filed at... 12/28/2023 0847   Respiratory Rate --   BP (Non-Invasive) 148/85 filed at... 12/28/2023 0847   SpO2 99 % filed at... 12/28/2023 0847   Height 1.6 m (5' 3) filed at... 12/28/2023 0847   Weight 96.8 kg (213 lb 6.4 oz) filed at... 12/28/2023 0847   BMI (Calculated) 37.88 filed at... 12/28/2023 0847   BSA (Calculated) 2.07 filed at... 12/28/2023 0847      ECOG Status: (0) Fully active, able to carry on all predisease performance without restriction   Physical Exam    DIAGNOSTIC DATA:  No results found for this or any previous visit (from the past 82479 hours).    LABS:   CBC  Diff   Lab Results   Component Value Date/Time    WBC 1.9 (LL) 07/01/2024 12:47 PM    HGB 12.4 07/01/2024 12:47 PM    HCT 34.7 07/01/2024 12:47 PM    PLTCNT 139 (L) 07/01/2024 12:47 PM    RBC 3.19 (L) 07/01/2024 12:47 PM    MCV 108.7 (H) 07/01/2024 12:47 PM    MCHC 35.7 (H) 07/01/2024 12:47 PM    MCH 38.8 (H) 07/01/2024 12:47 PM    RDW 14.3 07/01/2024 12:47 PM    MPV 9.4 07/01/2024 12:47 PM    Lab Results   Component Value Date/Time    PMNS 68 06/20/2024 08:24 AM    LYMPHOCYTES 22 (L) 06/20/2024 08:24 AM    EOSINOPHIL 4 06/20/2024 08:24 AM    MONOCYTES 5 06/20/2024 08:24 AM    BASOPHILS 1 06/20/2024 08:24 AM    PMNABS 1.20 (L) 03/20/2024 03:02 PM    LYMPHSABS 0.60 (L) 03/20/2024 03:02 PM    EOSABS 0.10 03/20/2024 03:02 PM    MONOSABS 0.20 03/20/2024 03:02 PM    BASABS 0.02 06/20/2024 08:24 AM            Comprehensive Metabolic Profile    Lab Results   Component Value Date    SODIUM 139 07/01/2024    POTASSIUM 4.5 07/01/2024    CHLORIDE 106 07/01/2024    CO2 27 07/01/2024    ANIONGAP 6 07/01/2024    BUN 15 07/01/2024    CREATININE 0.90 07/01/2024    ALBUMIN 4.3 07/01/2024    CALCIUM 9.2 07/01/2024    GLUCOSENF 104 07/01/2024    ALKPHOS 70 07/01/2024    ALT 16 07/01/2024    AST 32  07/01/2024    TOTBILIRUBIN 0.7 07/01/2024    TOTALPROTEIN 6.5 07/01/2024          BASIC METABOLIC PANEL  Lab Results   Component Value Date    SODIUM 139 07/01/2024    POTASSIUM 4.5 07/01/2024    CHLORIDE 106 07/01/2024    CO2 27 07/01/2024    ANIONGAP 6 07/01/2024    BUN 15 07/01/2024    CREATININE 0.90 07/01/2024    BUNCRRATIO 17 07/01/2024    GFR 71 07/01/2024    CALCIUM 9.2 07/01/2024    GLUCOSENF 104 07/01/2024           ASSESSMENT:  Problem List Items Addressed This Visit          Oncology    Breast cancer - Primary        ICD-10-CM    1. Breast cancer  C50.919          TREATMENT HISTORY:  Letrozole   Exemestane /palbociclib (did not tolerate exemestane )  Fulvestrant /palbociclib (with plan to add Inavoisib in the near future)    PLAN:  1. All relevant medical records were reviewed including available pertinent provider notes, procedure notes, imaging, laboratory, and pathology.   2. All pertinent labs and/or imaging were reviewed with the patient.   3. 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  ER positive, HER2 Neu negative.  She has developed a recurrence manifesting as letrozole  failure.  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 We reviewed the NCCN guidelines. At this time, we will discontinue efforts at hormone blockade and instead switch to chemotherapy. We reviewed the potential treatment options and she states that paclitaxel was very difficult for her to tolerate a weekly dose previously. Arrangements have been made to move forward with Abraxane. We will go ahead and initiate that therapy and I will see her back in about one month. We will periodically recheck her tumor markers in order to confirm treatment efficacy. Because of the PIK3CA mutation, she might be a candidate for apelisib as a  maintenance therapy after obtaining maximal benefit from abraxane.     Nathanel LITTIE Holts was given the chance to ask questions, and these were answered to their satisfaction. The patient is welcome to call with any questions or concerns in the meantime.     On the day of the encounter, a total of 35 minutes was spent on this patient encounter including review of historical information, examination, documentation and post-visit activities.   Return in about 4 weeks (around 07/29/2024).     Lynwood Pipes, MD  07/01/2024 , 13:33  The patient was seen as part of a collaborative telemedicine service with Dr. Pipes who participated in the encounter by active presence via approved video/audio means for portions of the encounter.    The patient's insurance company bears full legal and financial responsibility resulting from any deviations that they cause to my recommended treatment plan.    CC:  Etta Caldron [place], PA-C  [address]  [place] [address]    Steen Lenis, MD  [address]  [place],  [place] [address]    This note was partially generated using MModal Fluency Direct system, and there may be some incorrect words, spellings, and punctuation that were not noted in checking the note before saving.

## 2024-07-01 NOTE — Nurses Notes (Addendum)
 1344-Patient to unit for chemo treatment.  Patient reports no prior issues with chemotherapy transfusions.  First time receiving Abraxane.  Lung sounds are clear and normal heart sounds present.  Vital signs are stable.  Distress screening completed.  Patient has no complaints at this time.Cherene Benders, RN    Patient Assessment/Symptom Management Patient Has MD Appointment Today   Key: (+) Symptom present           (-)  Symptom not present If Symptom is Positive(+) a Nursing Note is required   Edema -   Uncontrolled Nausea -   Vomiting -   Inability to eat/drink -   Mouth Sores -   Diarrhea -   Constipation (? Last BM) -   Fatigue that interferes with ADL's -   Numbness/Tingling -change -   Other -   Fever/Signs & Symptoms of infection -   Nurse Initials KB       1400-PAC accessed and blood return obtained.Cherene Benders, RN  1431-Zofran  scanned and given per order.Cherene Benders, RN  1505-ABRAXANE infusion started.Cherene Benders, RN  1535-ABRAXANE infusion completed.  Patient tolerated well.Cherene Benders, RN  1605-PAC flushed and deaccessed.  Blood return confirmed prior to Sylvan Benders, RN  1610-Patient checking out now.  Vitals stable.Cherene Benders, RN

## 2024-07-01 NOTE — Nursing Note (Signed)
 Patient is here for first treatment with new chemo. All questions answered. Went over labs. Patient to get treatment today. Return in 4 weeks.

## 2024-07-01 NOTE — Telephone Encounter (Signed)
 Judy from the lab called a WBC of 1.9 on Melanie Snyder  MRN [id number].  Results sent to Dr. Moses via secure chat.  Niels Sayre, MA

## 2024-07-02 ENCOUNTER — Other Ambulatory Visit: Payer: Self-pay

## 2024-07-03 LAB — CA 27,29: CA 27.29: 132 U/mL — ABNORMAL HIGH (ref <38)

## 2024-07-08 ENCOUNTER — Telehealth (INDEPENDENT_AMBULATORY_CARE_PROVIDER_SITE_OTHER): Payer: Self-pay | Admitting: HEMATOLOGY-ONCOLOGY

## 2024-07-08 ENCOUNTER — Ambulatory Visit
Admission: RE | Admit: 2024-07-08 | Discharge: 2024-07-08 | Disposition: A | Discharge status: Home / Self Care | Source: Ambulatory Visit | Attending: HEMATOLOGY-ONCOLOGY | Admitting: HEMATOLOGY-ONCOLOGY

## 2024-07-08 ENCOUNTER — Other Ambulatory Visit (HOSPITAL_COMMUNITY): Payer: Self-pay | Admitting: HEMATOLOGY-ONCOLOGY

## 2024-07-08 ENCOUNTER — Other Ambulatory Visit: Payer: Self-pay

## 2024-07-08 DIAGNOSIS — C50919 Malignant neoplasm of unspecified site of unspecified female breast: Secondary | ICD-10-CM

## 2024-07-08 DIAGNOSIS — D709 Neutropenia, unspecified: Secondary | ICD-10-CM | POA: Insufficient documentation

## 2024-07-08 LAB — CBC WITH DIFF
HCT: 32.7 % (ref 31.2–41.9)
HGB: 11.8 g/dL (ref 10.9–14.3)
MCH: 38.7 pg — ABNORMAL HIGH (ref 24.7–32.8)
MCHC: 36.1 g/dL — ABNORMAL HIGH (ref 32.3–35.6)
MCV: 107.1 fL — ABNORMAL HIGH (ref 75.5–95.3)
MPV: 10.1 fL (ref 7.9–10.8)
PLATELETS: 124 x10ˆ3/uL — ABNORMAL LOW (ref 140–440)
RBC: 3.05 x10ˆ6/uL — ABNORMAL LOW (ref 3.63–4.92)
RDW: 13.8 % (ref 12.3–17.7)
WBC: 1.5 x10ˆ3/uL — CL (ref 3.8–11.8)

## 2024-07-08 LAB — MANUAL DIFFERENTIAL
BAND %: 1 % — ABNORMAL LOW (ref 5–11)
BANDS NEUTROPHILS MANUAL: 1
BASOPHIL %: 3 % (ref 0–3)
BASOPHIL ABSOLUTE: 0.05 x10ˆ3/uL (ref 0.00–0.30)
BASOPHILS MANUAL: 3
EOSINOPHIL %: 1 % (ref 0–7)
EOSINOPHIL ABSOLUTE: 0.02 x10ˆ3/uL (ref 0.00–0.80)
EOSINOPHILS MANUAL: 1
LYMPHOCYTE %: 37 % (ref 25–45)
LYMPHOCYTE ABSOLUTE: 0.56 x10ˆ3/uL — ABNORMAL LOW (ref 1.10–5.00)
LYMPHOCYTES MANUAL: 37
MONOCYTE %: 9 % (ref 0–12)
MONOCYTE ABSOLUTE: 0.14 x10ˆ3/uL (ref 0.00–0.30)
MONOCYTES MANUAL: 9
NEUTROPHIL %: 49 % (ref 40–76)
NEUTROPHIL ABSOLUTE: 0.75 x10ˆ3/uL — CL (ref 1.80–8.40)
NEUTROPHILS MANUAL: 49
PLATELET MORPHOLOGY COMMENT: NORMAL
SCHISTOCYTES: ABSENT
TOTAL CELLS COUNTED [#] IN BLOOD: 100
WBC: 1.5 x10ˆ3/uL

## 2024-07-08 NOTE — Telephone Encounter (Signed)
 Received secure chat from [facility] R.N. and Luke Moose, R.N. re: pt having critical labs, pt scheduled for treatment today and was upset that she was unable to get treatment.  Per Dr. Moses, Zarxio  ordered stat, it was 5pm and authorization team was gone for the day.  Called [facility], spoke with Melanie Snyder Pac Dx. Codes, procedure code, dosing and facility info.  Asked Melanie to mark as urgent request & stated needed approval by tomorrow morning. Melanie entered info and submitted e-mail to nurse reviewer and marked as urgent request, faxed clinicals to [phone], case Ref# [id number]. Submitted tx plan along with clinicals. Melanie Snyder states nurse reviewer will look at first thing in the morning.  I sent urgent message to auth team also stating above info.     Called [name] and let her know that we have submitted her info this evening & hopefully should be calling her early in the morning and did not want her to worry about this.  I will check status in the morning and call [facility] if needed.

## 2024-07-08 NOTE — Nurses Notes (Signed)
 1509 Pt to OP ONC unit for labs/tx. VS obtained. Port accessed with ease. Excellent blood return and flushes easily. No c/o voiced. Assessment complete. Rexene Lipps, RN  1611 WBC 1.5, ANC 0.75 Dr.Mackey notified and states skip tx this week. Pt aware. Very tearful states she is afraid the cancer will spread since it is aggressive. Questions if she would be eligible for shot to boost WBC count like she had when she received chemo in the past. Rexene Lipps, RN  [phone] Per Dr.Mackey Well, she can't get neulasta because the treatment is weekly, so the only other option would be to do regular neupogen  or whatever variant of that the insurance company whines about, and then get injections for a few days every week after every treatment or we can cut the dose of her chemo. Pt states she does not want to reduce her chemo dose. States her dose is already lowered. Rexene Lipps, RN  [phone] Message sent to [place] RN and Tobias Mantel. They will start on authorization for Neupogen  480 mcg subcutaneous qd x 3 days. Pt aware that she will be contacted ASAP. Luke Moose RN spoke to pt. Rexene Lipps, RN  [phone] Port flushed with 30 ml NS. [place] d/c'ed. Site clear. Drsg applied. Rexene Lipps, RN  [phone] Pt left OP Onc unit ambulatory. Rexene Lipps, RN

## 2024-07-09 ENCOUNTER — Ambulatory Visit
Admission: RE | Admit: 2024-07-09 | Discharge: 2024-07-09 | Disposition: A | Discharge status: Home / Self Care | Source: Ambulatory Visit | Attending: HEMATOLOGY-ONCOLOGY | Admitting: HEMATOLOGY-ONCOLOGY

## 2024-07-09 ENCOUNTER — Encounter (HOSPITAL_COMMUNITY): Payer: Self-pay | Admitting: HEMATOLOGY-ONCOLOGY

## 2024-07-09 DIAGNOSIS — D701 Agranulocytosis secondary to cancer chemotherapy: Secondary | ICD-10-CM | POA: Insufficient documentation

## 2024-07-09 DIAGNOSIS — Z7689 Persons encountering health services in other specified circumstances: Secondary | ICD-10-CM | POA: Insufficient documentation

## 2024-07-09 DIAGNOSIS — T451X5A Adverse effect of antineoplastic and immunosuppressive drugs, initial encounter: Secondary | ICD-10-CM | POA: Insufficient documentation

## 2024-07-09 MED ORDER — FILGRASTIM-SNDZ 480 MCG/0.8 ML INJECTION SYRINGE
5.0000 ug/kg | INJECTION | Freq: Once | INTRAMUSCULAR | Status: AC
Start: 2024-07-09 — End: 2024-07-09
  Administered 2024-07-09: 480 ug via SUBCUTANEOUS
  Filled 2024-07-09: qty 0.8

## 2024-07-09 NOTE — Nurses Notes (Signed)
 1238-Arrived to OP ONC ambulatory.  Here for Day 1 of 3 Zarxio  480mcg injections.  Alan Borrow, RN  1255-Zarxio  administered sub-q in left upper arm.  Will monitor for an additional 30 minutes with first injection Alan Borrow, RN  1307-VSS.  No voiced complaints offered. Alan Borrow, RN  1330-No adverse reactions noted.  Left OP ONC ambulatory with no acute distress. Will return tomorrow for next injection.  Alan Borrow, RN

## 2024-07-10 ENCOUNTER — Ambulatory Visit
Admission: RE | Admit: 2024-07-10 | Discharge: 2024-07-10 | Disposition: A | Discharge status: Home / Self Care | Source: Ambulatory Visit | Attending: HEMATOLOGY-ONCOLOGY | Admitting: HEMATOLOGY-ONCOLOGY

## 2024-07-10 ENCOUNTER — Ambulatory Visit (HOSPITAL_COMMUNITY): Payer: Self-pay

## 2024-07-10 ENCOUNTER — Encounter (INDEPENDENT_AMBULATORY_CARE_PROVIDER_SITE_OTHER): Payer: Self-pay | Admitting: Surgery

## 2024-07-10 ENCOUNTER — Other Ambulatory Visit: Payer: Self-pay

## 2024-07-10 DIAGNOSIS — D701 Agranulocytosis secondary to cancer chemotherapy: Secondary | ICD-10-CM

## 2024-07-10 DIAGNOSIS — T451X5A Adverse effect of antineoplastic and immunosuppressive drugs, initial encounter: Secondary | ICD-10-CM | POA: Insufficient documentation

## 2024-07-10 MED ORDER — FILGRASTIM-SNDZ 480 MCG/0.8 ML INJECTION SYRINGE
5.0000 ug/kg | INJECTION | Freq: Once | INTRAMUSCULAR | Status: AC
Start: 2024-07-10 — End: 2024-07-10
  Administered 2024-07-10: 480 ug via SUBCUTANEOUS
  Filled 2024-07-10: qty 0.8

## 2024-07-10 NOTE — Nurses Notes (Signed)
 1204-1218-Arrived to OP ONC ambulatory.  Here for Day 2 of 3 Zarxio  480mcg injections.  Does verbalize c/o some body aches and slight headache otherwise feeling ok.  Zarxio  administered sub-q in left upper arm.  No voiced complaints offered.  Left OP ONC ambulatory with no acute distress. Will return tomorrow for next injection.  Alan Borrow, RN

## 2024-07-11 ENCOUNTER — Ambulatory Visit
Admission: RE | Admit: 2024-07-11 | Discharge: 2024-07-11 | Disposition: A | Discharge status: Home / Self Care | Payer: Self-pay | Source: Ambulatory Visit | Attending: HEMATOLOGY-ONCOLOGY | Admitting: HEMATOLOGY-ONCOLOGY

## 2024-07-11 DIAGNOSIS — D701 Agranulocytosis secondary to cancer chemotherapy: Secondary | ICD-10-CM | POA: Insufficient documentation

## 2024-07-11 DIAGNOSIS — T451X5A Adverse effect of antineoplastic and immunosuppressive drugs, initial encounter: Secondary | ICD-10-CM | POA: Insufficient documentation

## 2024-07-11 MED ORDER — FILGRASTIM-SNDZ 480 MCG/0.8 ML INJECTION SYRINGE
5.0000 ug/kg | INJECTION | Freq: Once | INTRAMUSCULAR | Status: AC
Start: 2024-07-11 — End: 2024-07-11
  Administered 2024-07-11: 480 ug via SUBCUTANEOUS
  Filled 2024-07-11: qty 0.8

## 2024-07-11 NOTE — Nurses Notes (Signed)
**Note De-Identified Liahm Grivas Obfuscation** [phone]: Ambulatory to [address]. Here for day 3 of 3 zarxio  480mcg injections. No c/o verbalized at this time. Zarxio  administered sub-q in the left upper arm. Tolerated well. Ambulatory off the unit. Andrez Abhinav Mayorquin, RN

## 2024-07-15 ENCOUNTER — Ambulatory Visit
Admission: RE | Admit: 2024-07-15 | Discharge: 2024-07-15 | Disposition: A | Discharge status: Home / Self Care | Source: Ambulatory Visit | Attending: HEMATOLOGY-ONCOLOGY | Admitting: HEMATOLOGY-ONCOLOGY

## 2024-07-15 ENCOUNTER — Other Ambulatory Visit: Payer: Self-pay

## 2024-07-15 ENCOUNTER — Encounter (HOSPITAL_COMMUNITY): Payer: Self-pay

## 2024-07-15 VITALS — BP 147/74 | HR 91 | Temp 97.4°F | Resp 18 | Ht 63.0 in

## 2024-07-15 DIAGNOSIS — C50919 Malignant neoplasm of unspecified site of unspecified female breast: Secondary | ICD-10-CM

## 2024-07-15 DIAGNOSIS — Z5111 Encounter for antineoplastic chemotherapy: Secondary | ICD-10-CM | POA: Insufficient documentation

## 2024-07-15 LAB — MANUAL DIFFERENTIAL
BAND %: 3 % — ABNORMAL LOW (ref 5–11)
BANDS NEUTROPHILS MANUAL: 3
EOSINOPHIL %: 2 % (ref 0–7)
EOSINOPHIL ABSOLUTE: 0.14 x10ˆ3/uL (ref 0.00–0.80)
EOSINOPHILS MANUAL: 2
LYMPHOCYTE %: 16 % — ABNORMAL LOW (ref 25–45)
LYMPHOCYTE ABSOLUTE: 1.15 x10ˆ3/uL (ref 1.10–5.00)
LYMPHOCYTES MANUAL: 16
METAMYELOCYTE %: 1 %
METAMYELOCYTE ABSOLUTE: 0.07 x10ˆ3/uL
METAMYELOCYTES MANUAL: 1
MONOCYTE %: 18 % — ABNORMAL HIGH (ref 0–12)
MONOCYTE ABSOLUTE: 1.3 x10ˆ3/uL — ABNORMAL HIGH (ref 0.00–0.30)
MONOCYTES MANUAL: 18
NEUTROPHIL %: 60 % (ref 40–76)
NEUTROPHIL ABSOLUTE: 4.54 x10ˆ3/uL (ref 1.80–8.40)
NEUTROPHILS MANUAL: 60
SCHISTOCYTES: ABSENT
TOTAL CELLS COUNTED [#] IN BLOOD: 100
WBC: 7.2 x10ˆ3/uL

## 2024-07-15 LAB — CBC WITH DIFF
HCT: 34.4 % (ref 31.2–41.9)
HGB: 12.3 g/dL (ref 10.9–14.3)
MCH: 38.1 pg — ABNORMAL HIGH (ref 24.7–32.8)
MCHC: 35.7 g/dL — ABNORMAL HIGH (ref 32.3–35.6)
MCV: 106.8 fL — ABNORMAL HIGH (ref 75.5–95.3)
MPV: 9.4 fL (ref 7.9–10.8)
PLATELETS: 143 x10ˆ3/uL (ref 140–440)
RBC: 3.22 x10ˆ6/uL — ABNORMAL LOW (ref 3.63–4.92)
RDW: 13.8 % (ref 12.3–17.7)
WBC: 7.2 x10ˆ3/uL (ref 3.8–11.8)

## 2024-07-15 MED ORDER — DIPHENHYDRAMINE 50 MG/ML INJECTION SOLUTION: 50.0000 mg | Freq: Once | INTRAMUSCULAR | Status: DC | PRN

## 2024-07-15 MED ORDER — DEXTROSE 5% IN WATER (D5W) FLUSH BAG - 250 ML: INTRAVENOUS | Status: DC | PRN

## 2024-07-15 MED ORDER — ALBUTEROL SULFATE 2.5 MG/3 ML (0.083 %) SOLUTION FOR NEBULIZATION: 2.5000 mg | INHALATION_SOLUTION | Freq: Once | RESPIRATORY_TRACT | Status: DC | PRN

## 2024-07-15 MED ORDER — DIPHENHYDRAMINE 50 MG/ML INJECTION SOLUTION: 25.0000 mg | Freq: Once | INTRAMUSCULAR | Status: DC | PRN

## 2024-07-15 MED ORDER — ALBUTEROL SULFATE HFA 90 MCG/ACTUATION AEROSOL INHALER - RN: 2.0000 | Freq: Once | RESPIRATORY_TRACT | Status: DC | PRN

## 2024-07-15 MED ORDER — PACLITAXEL-PROTEIN BOUND IVPB
100.0000 mg/m2 | INJECTION | Freq: Once | INTRAVENOUS | Status: AC
  Administered 2024-07-15: 200 mg via INTRAVENOUS
  Administered 2024-07-15: 0 mg via INTRAVENOUS
  Filled 2024-07-15: qty 40

## 2024-07-15 MED ORDER — MEPERIDINE (PF) 25 MG/ML INJECTION SOLUTION: 12.5000 mg | Freq: Once | INTRAMUSCULAR | Status: DC | PRN

## 2024-07-15 MED ORDER — PROCHLORPERAZINE MALEATE 10 MG TABLET: 10.0000 mg | ORAL_TABLET | Freq: Once | ORAL | Status: DC | PRN

## 2024-07-15 MED ORDER — HYDROCORTISONE SOD SUCCINATE 100 MG/2 ML VIAL WRAPPER: 100.0000 mg | Freq: Once | INTRAMUSCULAR | Status: DC | PRN

## 2024-07-15 MED ORDER — EPINEPHRINE 1 MG/ML (1 ML) INJECTION SOLUTION: 0.3000 mg | Freq: Once | INTRAMUSCULAR | Status: DC | PRN

## 2024-07-15 MED ORDER — SODIUM CHLORIDE 0.9% FLUSH BAG - 250 ML: INTRAVENOUS | Status: DC | PRN

## 2024-07-15 MED ORDER — SODIUM CHLORIDE 0.9 % IV BOLUS: 500.0000 mL | INJECTION | Freq: Once | Status: DC | PRN

## 2024-07-15 MED ORDER — ONDANSETRON HCL 8 MG TABLET
16.0000 mg | ORAL_TABLET | Freq: Once | ORAL | Status: AC
  Administered 2024-07-15: 16 mg via ORAL
  Filled 2024-07-15: qty 2

## 2024-07-15 MED ORDER — PROCHLORPERAZINE EDISYLATE 10 MG/2 ML (5 MG/ML) INJECTION SOLUTION: 10.0000 mg | Freq: Once | INTRAMUSCULAR | Status: DC | PRN

## 2024-07-15 MED ORDER — FAMOTIDINE (PF) 20 MG/2 ML INTRAVENOUS SOLUTION: 20.0000 mg | Freq: Once | INTRAVENOUS | Status: DC | PRN

## 2024-07-15 NOTE — Nurses Notes (Signed)
 1525: Patient to unit for chemo treatment.  Patient reports no prior issues with chemotherapy.  Lung sounds are clear. VSS.   No complaints at this time.Madelin Clay, RN  1540: PAC accessed and blood return obtained. Labs drawn/sent.,me  1609:Zofran  scanned and given per order.earache  [phone]: ABRAXANE  infusion given over 30 minutes.  Patient tolerated well. Madelin Clay, RN  [phone]: PAC flushed and deaccessed. Madelin Clay, RN  [phone]: Patient left unit ambulatory in no new distress. VSS. Madelin Clay, RN

## 2024-07-16 ENCOUNTER — Encounter (INDEPENDENT_AMBULATORY_CARE_PROVIDER_SITE_OTHER): Admitting: Surgery

## 2024-07-17 ENCOUNTER — Encounter (INDEPENDENT_AMBULATORY_CARE_PROVIDER_SITE_OTHER): Admitting: Surgery

## 2024-07-18 ENCOUNTER — Other Ambulatory Visit: Payer: Self-pay

## 2024-07-18 ENCOUNTER — Ambulatory Visit
Admission: RE | Admit: 2024-07-18 | Discharge: 2024-07-18 | Disposition: A | Discharge status: Still In Hospital | Source: Ambulatory Visit | Attending: RADIATION ONCOLOGY | Admitting: RADIATION ONCOLOGY

## 2024-07-18 DIAGNOSIS — Z79633 Long term (current) use of mitotic inhibitor: Secondary | ICD-10-CM | POA: Insufficient documentation

## 2024-07-18 DIAGNOSIS — C50919 Malignant neoplasm of unspecified site of unspecified female breast: Secondary | ICD-10-CM | POA: Insufficient documentation

## 2024-07-18 DIAGNOSIS — C7981 Secondary malignant neoplasm of breast: Secondary | ICD-10-CM

## 2024-07-18 DIAGNOSIS — Z08 Encounter for follow-up examination after completed treatment for malignant neoplasm: Secondary | ICD-10-CM

## 2024-07-18 NOTE — Progress Notes (Signed)
 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    Assessment:  68 year old with recurrent metastatic breast cancer.  She began a new regimen of Abraxane  in his tolerating it reasonably well.  She has no new complaints.      Recommendations:  I have recommended she continue on her systemic therapy.  We will have her return in another month to check her Signatera levels.  She is instructed to call in the interim should she have problems or concerns.    The indications, time course, benefits, risks and side effects of radiation treatment were explained to the patient, and her questions were answered to her apparent satisfaction. I encouraged her to contact us  at any time should she have any further questions or concerns. I personally saw and examined the patient, and reviewed all prior imaging and pathologic findings with her. On the day of the encounter, a total of 30 minutes was spent on this patient encounter including review of historical data, examination, documentation and post-visit activities.  This time documented excludes procedural time.   FULL NOTE     Interval History :   Melanie Snyder is a 68 y.o. female with a history of recurrent metastatic breast cancer.  She has begun Abraxane  due to progression on prior medications.  She has had 2 doses and tolerated it well though she required neutropenia support for the 2nd dose.    Pain Assessment:  Minimal    Past Medical/Surgical History:  Past Medical History:   Diagnosis Date    Breast CA     RIGHT CHEMO/RADIATION    Breast cancer 01/11/2022    Cardiac arrhythmia     Hyperlipidemia     Hypothyroidism     Malignant neoplasm of right female breast     Osteopenia          Past Surgical History:   Procedure Laterality Date    CESAREAN SECTION N/A     HX BREAST BIOPSY Right      6 months of chemo and radiation after that    HX BREAST LUMPECTOMY Right     CHEMO/RADIATION    HX BREAST REDUCTION Left            Family History:   Family Medical History:       Problem Relation (Age of Onset)    Cancer Mother, Maternal Grandfather    Coronary Artery Disease Paternal Aunt, Paternal Uncle    No Known Problems Father, Sister, Brother, Maternal Grandmother, Paternal Grandmother, Paternal Grandfather, Daughter, Son, Maternal Aunt, Maternal Uncle, Other              Social History:   Social History     Socioeconomic History    Marital status: Married     Spouse name: Not on file    Number of children: Not on file    Years of education: Not on file    Highest education level: Not on file   Occupational History    Not on file   Tobacco Use    Smoking status: Never    Smokeless tobacco: Never   Vaping Use    Vaping status: Never Used   Substance and Sexual Activity    Alcohol  use: Never    Drug use:  Never    Sexual activity: Not Currently   Other Topics Concern    Not on file   Social History Narrative    Not on file     Social Determinants of Health     Financial Resource Strain: Not on file   Transportation Needs: Not on file   Social Connections: Not on file   Intimate Partner Violence: Not on file   Housing Stability: Not on file       ALLERGIES:   Allergies[1]     MEDICATIONS:   Current Outpatient Medications   Medication Instructions    calcium carbonate/vitamin D3 (CALTRATE-600 PLUS VITAMIN D3 ORAL) Take 1 Tablet by mouth Daily    cetirizine (ZYRTEC) 10 mg, DAILY PRN    famotidine  (PEPCID ) 20 mg, Oral, EVERY EVENING    levothyroxine (SYNTHROID) 112 mcg Oral Tablet Take 1 Tablet (112 mcg total) by mouth Every morning    lidocaine -prilocaine (EMLA) 2.5-2.5 % Cream APPLY CREAM TOPICALLY TO AFFECTED AREA TWICE DAILY AS NEEDED    meloxicam (MOBIC) 15 mg, Daily    ondansetron  (ZOFRAN  ODT) 8 mg, Sublingual, EVERY 8 HOURS PRN    pravastatin (PRAVACHOL) 40 mg, Daily        REVIEW OF SYSTEMS  Pertinent  review of systems as discussed in Interval History.      Objective:   There were no vitals filed for this visit.          PHYSICAL EXAMINATION  Physical Exam  Constitutional:       Appearance: Normal appearance.   HENT:      Mouth/Throat:      Mouth: Mucous membranes are moist.   Eyes:      Extraocular Movements: Extraocular movements intact.      Pupils: Pupils are equal, round, and reactive to light.   Pulmonary:      Effort: Pulmonary effort is normal.   Musculoskeletal:         General: Normal range of motion.   Skin:     General: Skin is warm and dry.   Neurological:      General: No focal deficit present.      Mental Status: She is alert and oriented to person, place, and time.   Psychiatric:         Mood and Affect: Mood normal.         Behavior: Behavior normal.          LABS/IMAGING: All relevant labs and imaging were reviewed as per HPI.      Fairy Patten, MD 07/18/2024, 11:19    rr:[id number]@          [1]   Allergies  Allergen Reactions    Amoxicillin      Sore throat

## 2024-07-19 ENCOUNTER — Other Ambulatory Visit: Payer: Self-pay

## 2024-07-19 ENCOUNTER — Ambulatory Visit: Admission: RE | Admit: 2024-07-19 | Discharge: 2024-07-19 | Payer: Self-pay | Attending: HEMATOLOGY-ONCOLOGY

## 2024-07-19 VITALS — BP 105/75 | HR 105 | Temp 97.8°F | Resp 17 | Ht 64.0 in | Wt 215.6 lb

## 2024-07-19 DIAGNOSIS — T451X5A Adverse effect of antineoplastic and immunosuppressive drugs, initial encounter: Secondary | ICD-10-CM | POA: Insufficient documentation

## 2024-07-19 DIAGNOSIS — D701 Agranulocytosis secondary to cancer chemotherapy: Secondary | ICD-10-CM | POA: Insufficient documentation

## 2024-07-19 LAB — CBC WITH DIFF
BASOPHIL #: 0 x10ˆ3/uL (ref 0.00–0.10)
BASOPHIL %: 1 % (ref 0–1)
EOSINOPHIL #: 0.1 x10ˆ3/uL (ref 0.00–0.50)
EOSINOPHIL %: 2 % (ref 1–7)
HCT: 34.4 % (ref 31.2–41.9)
HGB: 12.1 g/dL (ref 10.9–14.3)
LYMPHOCYTE #: 0.7 x10ˆ3/uL — ABNORMAL LOW (ref 1.10–3.10)
LYMPHOCYTE %: 16 % (ref 16–46)
MCH: 37.4 pg — ABNORMAL HIGH (ref 24.7–32.8)
MCHC: 35.1 g/dL (ref 32.3–35.6)
MCV: 106.7 fL — ABNORMAL HIGH (ref 75.5–95.3)
MONOCYTE #: 0.2 x10ˆ3/uL (ref 0.20–0.90)
MONOCYTE %: 4 % (ref 4–11)
MPV: 10.1 fL (ref 7.9–10.8)
NEUTROPHIL #: 3.2 x10ˆ3/uL (ref 1.90–8.20)
NEUTROPHIL %: 78 % — ABNORMAL HIGH (ref 43–77)
PLATELETS: 103 x10ˆ3/uL — ABNORMAL LOW (ref 140–440)
RBC: 3.23 x10ˆ6/uL — ABNORMAL LOW (ref 3.63–4.92)
RDW: 13.8 % (ref 12.3–17.7)
WBC: 4.1 x10ˆ3/uL (ref 3.8–11.8)

## 2024-07-19 NOTE — Nurses Notes (Addendum)
 1527-Patient here for lab work and possible Zarxio  injection.  Patient reports lack of appetite and intermittent nausea with last chemo treatment.  Lab work released.  Vital signs are stable.Cherene Benders, RN  1600-Patient informed that lab values do not meet requirement for treatment.  Patient checking out now.Cherene Benders, RN

## 2024-07-22 ENCOUNTER — Ambulatory Visit (INDEPENDENT_AMBULATORY_CARE_PROVIDER_SITE_OTHER): Payer: Self-pay | Admitting: HEMATOLOGY-ONCOLOGY

## 2024-07-22 ENCOUNTER — Encounter (HOSPITAL_COMMUNITY): Payer: Self-pay | Admitting: HEMATOLOGY-ONCOLOGY

## 2024-07-22 ENCOUNTER — Ambulatory Visit
Admission: RE | Admit: 2024-07-22 | Discharge: 2024-07-22 | Disposition: A | Discharge status: Home / Self Care | Payer: Self-pay | Source: Ambulatory Visit | Attending: PHYSICIAN ASSISTANT | Admitting: PHYSICIAN ASSISTANT

## 2024-07-22 ENCOUNTER — Encounter (HOSPITAL_COMMUNITY): Payer: Self-pay

## 2024-07-22 ENCOUNTER — Other Ambulatory Visit: Payer: Self-pay

## 2024-07-22 VITALS — BP 138/73 | HR 94 | Temp 97.0°F | Resp 15 | Ht 64.02 in | Wt 214.0 lb

## 2024-07-22 DIAGNOSIS — Z5111 Encounter for antineoplastic chemotherapy: Secondary | ICD-10-CM | POA: Insufficient documentation

## 2024-07-22 DIAGNOSIS — C50919 Malignant neoplasm of unspecified site of unspecified female breast: Secondary | ICD-10-CM

## 2024-07-22 LAB — CBC WITH DIFF
BASOPHIL #: 0 x10ˆ3/uL (ref 0.00–0.10)
BASOPHIL %: 1 % (ref 0–1)
EOSINOPHIL #: 0.1 x10ˆ3/uL (ref 0.00–0.50)
EOSINOPHIL %: 3 % (ref 1–7)
HCT: 31.4 % (ref 31.2–41.9)
HGB: 10.9 g/dL (ref 10.9–14.3)
LYMPHOCYTE #: 0.6 x10ˆ3/uL — ABNORMAL LOW (ref 1.10–3.10)
LYMPHOCYTE %: 25 % (ref 16–46)
MCH: 36.9 pg — ABNORMAL HIGH (ref 24.7–32.8)
MCHC: 34.8 g/dL (ref 32.3–35.6)
MCV: 106.2 fL — ABNORMAL HIGH (ref 75.5–95.3)
MONOCYTE #: 0.2 x10ˆ3/uL (ref 0.20–0.90)
MONOCYTE %: 10 % (ref 4–11)
MPV: 10.1 fL (ref 7.9–10.8)
NEUTROPHIL #: 1.5 x10ˆ3/uL — ABNORMAL LOW (ref 1.90–8.20)
NEUTROPHIL %: 61 % (ref 43–77)
PLATELETS: 134 x10ˆ3/uL — ABNORMAL LOW (ref 140–440)
RBC: 2.95 x10ˆ6/uL — ABNORMAL LOW (ref 3.63–4.92)
RDW: 13.4 % (ref 12.3–17.7)
WBC: 2.4 x10ˆ3/uL — ABNORMAL LOW (ref 3.8–11.8)

## 2024-07-22 MED ORDER — SODIUM CHLORIDE 0.9 % IV BOLUS: 500.0000 mL | INJECTION | Freq: Once | Status: DC | PRN

## 2024-07-22 MED ORDER — PACLITAXEL-PROTEIN BOUND IVPB
100.0000 mg/m2 | INJECTION | Freq: Once | INTRAVENOUS | Status: AC
  Administered 2024-07-22: 200 mg via INTRAVENOUS
  Administered 2024-07-22: 0 mg via INTRAVENOUS
  Filled 2024-07-22: qty 40

## 2024-07-22 MED ORDER — MEPERIDINE (PF) 25 MG/ML INJECTION SOLUTION: 12.5000 mg | Freq: Once | INTRAMUSCULAR | Status: DC | PRN

## 2024-07-22 MED ORDER — EPINEPHRINE 1 MG/ML (1 ML) INJECTION SOLUTION: 0.3000 mg | Freq: Once | INTRAMUSCULAR | Status: DC | PRN

## 2024-07-22 MED ORDER — DIPHENHYDRAMINE 50 MG/ML INJECTION SOLUTION: 50.0000 mg | Freq: Once | INTRAMUSCULAR | Status: DC | PRN

## 2024-07-22 MED ORDER — DIPHENHYDRAMINE 50 MG/ML INJECTION SOLUTION: 25.0000 mg | Freq: Once | INTRAMUSCULAR | Status: DC | PRN

## 2024-07-22 MED ORDER — FAMOTIDINE (PF) 20 MG/2 ML INTRAVENOUS SOLUTION: 20.0000 mg | Freq: Once | INTRAVENOUS | Status: DC | PRN

## 2024-07-22 MED ORDER — ALBUTEROL SULFATE 2.5 MG/3 ML (0.083 %) SOLUTION FOR NEBULIZATION: 2.5000 mg | INHALATION_SOLUTION | Freq: Once | RESPIRATORY_TRACT | Status: DC | PRN

## 2024-07-22 MED ORDER — PROCHLORPERAZINE EDISYLATE 10 MG/2 ML (5 MG/ML) INJECTION SOLUTION: 10.0000 mg | Freq: Once | INTRAMUSCULAR | Status: DC | PRN

## 2024-07-22 MED ORDER — ONDANSETRON HCL 8 MG TABLET
16.0000 mg | ORAL_TABLET | Freq: Once | ORAL | Status: AC
  Administered 2024-07-22: 16 mg via ORAL
  Filled 2024-07-22: qty 2

## 2024-07-22 MED ORDER — DEXTROSE 5% IN WATER (D5W) FLUSH BAG - 250 ML: INTRAVENOUS | Status: DC | PRN

## 2024-07-22 MED ORDER — ALBUTEROL SULFATE HFA 90 MCG/ACTUATION AEROSOL INHALER - RN: 2.0000 | Freq: Once | RESPIRATORY_TRACT | Status: DC | PRN

## 2024-07-22 MED ORDER — HYDROCORTISONE SOD SUCCINATE 100 MG/2 ML VIAL WRAPPER: 100.0000 mg | Freq: Once | INTRAMUSCULAR | Status: DC | PRN

## 2024-07-22 MED ORDER — PROCHLORPERAZINE MALEATE 10 MG TABLET: 10.0000 mg | ORAL_TABLET | Freq: Once | ORAL | Status: DC | PRN

## 2024-07-22 MED ORDER — SODIUM CHLORIDE 0.9% FLUSH BAG - 250 ML: INTRAVENOUS | Status: DC | PRN

## 2024-07-22 NOTE — Nurses Notes (Addendum)
 1435-Patient to unit for chemo treatment.  Patient reports some intermittent nausea following last treatment but has no other complaints.  Lung sounds are clear and normal heart sounds present.  Vital signs are stable.Cherene Benders, RN     Patient Assessment/Symptom Management Patient Has No MD Appointment Today   Key: (+) Symptom present           (-)  Symptom not present If Symptom is Positive(+) a Nursing Note is required   Edema -   Nausea +   Vomiting -   Inability to eat/drink -   Mouth Sores -   Diarrhea -   Constipation (? Last BM) -   Fatigue that interferes with ADL's -   Numbness/Tingling -change -   Other -   Fever/Signs & Symptoms of infection -   Nurse Initials KB         1500-PAC accessed and blood return obtained.Cherene Benders, RN  1537-Zofran  scanned and given per order.Cherene Benders, RN  1604-ABRAXANE  infusion started.Cherene Benders, RN  1634-ABRAXANE  infusion completed.  Patient tolerated well.Cherene Benders, RN  1655-PAC flushed and deaccessed.  Blood return confirmed prior to Sylvan Benders, RN  1700-Patient checking out now.  Vitals stable.Cherene Benders, RN

## 2024-07-26 ENCOUNTER — Other Ambulatory Visit (INDEPENDENT_AMBULATORY_CARE_PROVIDER_SITE_OTHER): Payer: Self-pay | Admitting: NURSE PRACTITIONER

## 2024-07-26 MED ORDER — MAGIC MOUTHWASH: 10.0000 mL | ORAL | 1 refills | Status: AC | PRN

## 2024-07-26 NOTE — Telephone Encounter (Signed)
 Patient called reporting that she has started developing mouth sores.  She states the last time she had chemotherapy she had a mouth rinse that helped to numb the areas.  I advised patient this is magic mouthwash and typically we can only get it at [facility].  I did call [facility] pharmacy to confirm they do not do this medication and they do not.  I sent the prescription to [facility] pharmacy and called and notified patient of this.     Randine Clara APRN, FNP-BC, AOCNP, 07/26/2024 , 10:40

## 2024-07-29 ENCOUNTER — Other Ambulatory Visit: Payer: Self-pay

## 2024-07-29 ENCOUNTER — Ambulatory Visit (HOSPITAL_COMMUNITY): Payer: Self-pay

## 2024-07-29 ENCOUNTER — Ambulatory Visit
Admission: RE | Admit: 2024-07-29 | Discharge: 2024-07-29 | Disposition: A | Discharge status: Home / Self Care | Source: Ambulatory Visit | Attending: HEMATOLOGY-ONCOLOGY | Admitting: HEMATOLOGY-ONCOLOGY

## 2024-07-29 ENCOUNTER — Ambulatory Visit (INDEPENDENT_AMBULATORY_CARE_PROVIDER_SITE_OTHER): Payer: Self-pay | Admitting: HEMATOLOGY-ONCOLOGY

## 2024-07-29 VITALS — BP 143/73 | HR 95 | Temp 98.5°F | Resp 20

## 2024-07-29 DIAGNOSIS — T451X5A Adverse effect of antineoplastic and immunosuppressive drugs, initial encounter: Secondary | ICD-10-CM | POA: Insufficient documentation

## 2024-07-29 DIAGNOSIS — D701 Agranulocytosis secondary to cancer chemotherapy: Secondary | ICD-10-CM

## 2024-07-29 LAB — MANUAL DIFFERENTIAL
EOSINOPHIL %: 16 % — ABNORMAL HIGH (ref 0–7)
EOSINOPHIL ABSOLUTE: 0.28 x10ˆ3/uL (ref 0.00–0.80)
EOSINOPHILS MANUAL: 15
LYMPHOCYTE %: 25 % (ref 25–45)
LYMPHOCYTE ABSOLUTE: 0.45 x10ˆ3/uL — ABNORMAL LOW (ref 1.10–5.00)
LYMPHOCYTES MANUAL: 24
MONOCYTE %: 9 % (ref 0–12)
MONOCYTE ABSOLUTE: 0.17 x10ˆ3/uL (ref 0.00–0.30)
MONOCYTES MANUAL: 9
NEUTROPHIL %: 50 % (ref 40–76)
NEUTROPHIL ABSOLUTE: 0.9 x10ˆ3/uL — ABNORMAL LOW (ref 1.80–8.40)
NEUTROPHILS MANUAL: 48
PLATELET MORPHOLOGY COMMENT: NORMAL
SCHISTOCYTES: ABSENT
TOTAL CELLS COUNTED [#] IN BLOOD: 96
WBC: 1.8 x10ˆ3/uL

## 2024-07-29 LAB — CBC WITH DIFF
HCT: 28.9 % — ABNORMAL LOW (ref 31.2–41.9)
HGB: 10.3 g/dL — ABNORMAL LOW (ref 10.9–14.3)
MCH: 37.3 pg — ABNORMAL HIGH (ref 24.7–32.8)
MCHC: 35.7 g/dL — ABNORMAL HIGH (ref 32.3–35.6)
MCV: 104.7 fL — ABNORMAL HIGH (ref 75.5–95.3)
MPV: 9.9 fL (ref 7.9–10.8)
PLATELETS: 189 x10ˆ3/uL (ref 140–440)
RBC: 2.76 x10ˆ6/uL — ABNORMAL LOW (ref 3.63–4.92)
RDW: 13.9 % (ref 12.3–17.7)
WBC: 1.8 x10ˆ3/uL — CL (ref 3.8–11.8)

## 2024-07-29 MED ORDER — FILGRASTIM-SNDZ 480 MCG/0.8 ML INJECTION SYRINGE: 5.0000 ug/kg | INJECTION | Freq: Once | INTRAMUSCULAR | Status: DC

## 2024-07-29 NOTE — Nurses Notes (Signed)
 [id number] Patient came in ambulatory for labs and possible injection. Labs drawn peripherally by lab personnel. WBC 1.8, ANC 0.90. Messaged Dr. Moses and he said do not give Zarxio  injection and to call for fever. Instructed patient of this. Gave patient mask and instructed on Neutropenia protocol. Patient voiced understanding. Left  unit ambulatory with no c/o offered. Sharlet Montclair, RN

## 2024-07-30 ENCOUNTER — Encounter (HOSPITAL_COMMUNITY): Payer: Self-pay | Admitting: HEMATOLOGY-ONCOLOGY

## 2024-08-01 ENCOUNTER — Ambulatory Visit
Admission: RE | Admit: 2024-08-01 | Discharge: 2024-08-01 | Disposition: A | Discharge status: Home / Self Care | Source: Ambulatory Visit | Attending: PHYSICIAN ASSISTANT | Admitting: PHYSICIAN ASSISTANT

## 2024-08-01 ENCOUNTER — Other Ambulatory Visit: Payer: Self-pay

## 2024-08-01 DIAGNOSIS — D701 Agranulocytosis secondary to cancer chemotherapy: Secondary | ICD-10-CM | POA: Insufficient documentation

## 2024-08-01 DIAGNOSIS — T451X5A Adverse effect of antineoplastic and immunosuppressive drugs, initial encounter: Secondary | ICD-10-CM | POA: Insufficient documentation

## 2024-08-01 DIAGNOSIS — C50919 Malignant neoplasm of unspecified site of unspecified female breast: Secondary | ICD-10-CM | POA: Insufficient documentation

## 2024-08-01 LAB — MANUAL DIFFERENTIAL
BASOPHIL %: 1 % (ref 0–3)
BASOPHIL ABSOLUTE: 0.02 x10ˆ3/uL (ref 0.00–0.30)
BASOPHILS MANUAL: 1
EOSINOPHIL %: 4 % (ref 0–7)
EOSINOPHIL ABSOLUTE: 0.09 x10ˆ3/uL (ref 0.00–0.80)
EOSINOPHILS MANUAL: 4
LYMPHOCYTE %: 25 % (ref 25–45)
LYMPHOCYTE ABSOLUTE: 0.55 x10ˆ3/uL — ABNORMAL LOW (ref 1.10–5.00)
LYMPHOCYTES MANUAL: 25
MONOCYTE %: 22 % — ABNORMAL HIGH (ref 0–12)
MONOCYTE ABSOLUTE: 0.48 x10ˆ3/uL — ABNORMAL HIGH (ref 0.00–0.30)
MONOCYTES MANUAL: 22
NEUTROPHIL %: 48 % (ref 40–76)
NEUTROPHIL ABSOLUTE: 1.06 x10ˆ3/uL — ABNORMAL LOW (ref 1.80–8.40)
NEUTROPHILS MANUAL: 48
PLATELET MORPHOLOGY COMMENT: NORMAL
RBC MORPHOLOGY COMMENT: NORMAL
SCHISTOCYTES: ABSENT
TOTAL CELLS COUNTED [#] IN BLOOD: 100
WBC: 2.2 x10ˆ3/uL

## 2024-08-01 LAB — CBC WITH DIFF
HCT: 31.1 % — ABNORMAL LOW (ref 31.2–41.9)
HGB: 11 g/dL (ref 10.9–14.3)
MCH: 37.1 pg — ABNORMAL HIGH (ref 24.7–32.8)
MCHC: 35.3 g/dL (ref 32.3–35.6)
MCV: 105.2 fL — ABNORMAL HIGH (ref 75.5–95.3)
MPV: 9 fL (ref 7.9–10.8)
PLATELETS: 258 x10ˆ3/uL (ref 140–440)
RBC: 2.95 x10ˆ6/uL — ABNORMAL LOW (ref 3.63–4.92)
RDW: 14.2 % (ref 12.3–17.7)
WBC: 2.2 x10ˆ3/uL — ABNORMAL LOW (ref 3.8–11.8)

## 2024-08-01 NOTE — Nurses Notes (Signed)
 1523-1713-Arrived to OP ONC ambulatory to have CBC re-checked.  Labs obtained by Curvin Louder from main hospital lab. Dr. Moses notified of results.  No new orders given.  Left OP ONC ambulatory with no distress. Alan Borrow, RN

## 2024-08-05 ENCOUNTER — Ambulatory Visit
Admission: RE | Admit: 2024-08-05 | Discharge: 2024-08-05 | Disposition: A | Discharge status: Home / Self Care | Payer: Self-pay | Source: Ambulatory Visit | Attending: HEMATOLOGY-ONCOLOGY | Admitting: HEMATOLOGY-ONCOLOGY

## 2024-08-05 ENCOUNTER — Other Ambulatory Visit (INDEPENDENT_AMBULATORY_CARE_PROVIDER_SITE_OTHER): Payer: Self-pay | Admitting: HEMATOLOGY-ONCOLOGY

## 2024-08-05 ENCOUNTER — Other Ambulatory Visit: Payer: Self-pay

## 2024-08-05 ENCOUNTER — Ambulatory Visit (INDEPENDENT_AMBULATORY_CARE_PROVIDER_SITE_OTHER)
Admission: RE | Admit: 2024-08-05 | Discharge: 2024-08-05 | Disposition: A | Discharge status: Home / Self Care | Payer: Self-pay | Source: Ambulatory Visit | Attending: HEMATOLOGY-ONCOLOGY | Admitting: HEMATOLOGY-ONCOLOGY

## 2024-08-05 ENCOUNTER — Ambulatory Visit (HOSPITAL_BASED_OUTPATIENT_CLINIC_OR_DEPARTMENT_OTHER): Admitting: HEMATOLOGY-ONCOLOGY

## 2024-08-05 ENCOUNTER — Encounter (INDEPENDENT_AMBULATORY_CARE_PROVIDER_SITE_OTHER): Payer: Self-pay | Admitting: HEMATOLOGY-ONCOLOGY

## 2024-08-05 ENCOUNTER — Encounter (HOSPITAL_COMMUNITY): Payer: Self-pay

## 2024-08-05 VITALS — BP 153/84 | HR 86 | Temp 97.3°F | Resp 18

## 2024-08-05 VITALS — BP 132/80 | HR 95 | Temp 98.6°F | Ht 64.0 in | Wt 217.9 lb

## 2024-08-05 DIAGNOSIS — C50919 Malignant neoplasm of unspecified site of unspecified female breast: Secondary | ICD-10-CM

## 2024-08-05 DIAGNOSIS — Z170 Estrogen receptor positive status [ER+]: Secondary | ICD-10-CM | POA: Insufficient documentation

## 2024-08-05 DIAGNOSIS — G629 Polyneuropathy, unspecified: Secondary | ICD-10-CM | POA: Insufficient documentation

## 2024-08-05 DIAGNOSIS — K1379 Other lesions of oral mucosa: Secondary | ICD-10-CM | POA: Insufficient documentation

## 2024-08-05 DIAGNOSIS — Z79633 Long term (current) use of mitotic inhibitor: Secondary | ICD-10-CM | POA: Insufficient documentation

## 2024-08-05 DIAGNOSIS — Z5111 Encounter for antineoplastic chemotherapy: Secondary | ICD-10-CM | POA: Insufficient documentation

## 2024-08-05 DIAGNOSIS — Z1732 Human epidermal growth factor receptor 2 negative status: Secondary | ICD-10-CM | POA: Insufficient documentation

## 2024-08-05 DIAGNOSIS — C7951 Secondary malignant neoplasm of bone: Secondary | ICD-10-CM | POA: Insufficient documentation

## 2024-08-05 DIAGNOSIS — C787 Secondary malignant neoplasm of liver and intrahepatic bile duct: Secondary | ICD-10-CM | POA: Insufficient documentation

## 2024-08-05 LAB — CBC WITH DIFF
BASOPHIL #: 0 x10ˆ3/uL (ref 0.00–0.10)
BASOPHIL %: 1 % (ref 0–1)
EOSINOPHIL #: 0 x10ˆ3/uL (ref 0.00–0.50)
EOSINOPHIL %: 0 % — ABNORMAL LOW (ref 1–7)
HCT: 33 % (ref 31.2–41.9)
HGB: 11.7 g/dL (ref 10.9–14.3)
LYMPHOCYTE #: 0.6 x10ˆ3/uL — ABNORMAL LOW (ref 1.10–3.10)
LYMPHOCYTE %: 24 % (ref 16–46)
MCH: 37.1 pg — ABNORMAL HIGH (ref 24.7–32.8)
MCHC: 35.6 g/dL (ref 32.3–35.6)
MCV: 104.4 fL — ABNORMAL HIGH (ref 75.5–95.3)
MONOCYTE #: 0.5 x10ˆ3/uL (ref 0.20–0.90)
MONOCYTE %: 19 % — ABNORMAL HIGH (ref 4–11)
MPV: 8.7 fL (ref 7.9–10.8)
NEUTROPHIL #: 1.5 x10ˆ3/uL — ABNORMAL LOW (ref 1.90–8.20)
NEUTROPHIL %: 56 % (ref 43–77)
PLATELETS: 248 x10ˆ3/uL (ref 140–440)
RBC: 3.16 x10ˆ6/uL — ABNORMAL LOW (ref 3.63–4.92)
RDW: 14.9 % (ref 12.3–17.7)
WBC: 2.7 x10ˆ3/uL — ABNORMAL LOW (ref 3.8–11.8)

## 2024-08-05 LAB — COMPREHENSIVE METABOLIC PANEL, NON-FASTING
ALBUMIN/GLOBULIN RATIO: 2 — ABNORMAL HIGH (ref 0.8–1.4)
ALBUMIN: 4.3 g/dL (ref 3.5–5.7)
ALKALINE PHOSPHATASE: 83 U/L (ref 34–104)
ALT (SGPT): 17 U/L (ref 7–52)
ANION GAP: 5 mmol/L (ref 4–13)
AST (SGOT): 36 U/L (ref 13–39)
BILIRUBIN TOTAL: 0.7 mg/dL (ref 0.3–1.0)
BUN/CREA RATIO: 23 — ABNORMAL HIGH (ref 6–22)
BUN: 15 mg/dL (ref 7–25)
CALCIUM, CORRECTED: 8.3 mg/dL — ABNORMAL LOW (ref 8.9–10.8)
CALCIUM: 8.5 mg/dL — ABNORMAL LOW (ref 8.6–10.3)
CHLORIDE: 107 mmol/L (ref 98–107)
CO2 TOTAL: 25 mmol/L (ref 21–31)
CREATININE: 0.65 mg/dL (ref 0.60–1.30)
ESTIMATED GFR: 97 mL/min/1.73mˆ2 (ref >59)
GLOBULIN: 2.1 (ref 2.0–3.5)
GLUCOSE: 106 mg/dL (ref 74–109)
OSMOLALITY, CALCULATED: 275 mosm/kg (ref 270–290)
POTASSIUM: 4.8 mmol/L (ref 3.5–5.1)
PROTEIN TOTAL: 6.4 g/dL (ref 6.4–8.9)
SODIUM: 137 mmol/L (ref 136–145)

## 2024-08-05 MED ORDER — ALBUTEROL SULFATE HFA 90 MCG/ACTUATION AEROSOL INHALER - RN: 2.0000 | Freq: Once | RESPIRATORY_TRACT | Status: DC | PRN

## 2024-08-05 MED ORDER — PROCHLORPERAZINE EDISYLATE 10 MG/2 ML (5 MG/ML) INJECTION SOLUTION: 10.0000 mg | Freq: Once | INTRAMUSCULAR | Status: DC | PRN

## 2024-08-05 MED ORDER — HYDROCHLOROTHIAZIDE 12.5 MG CAPSULE: 12.5000 mg | ORAL_CAPSULE | Freq: Every day | ORAL | 4 refills | Status: AC

## 2024-08-05 MED ORDER — ONDANSETRON HCL 8 MG TABLET
16.0000 mg | ORAL_TABLET | Freq: Once | ORAL | Status: AC
  Administered 2024-08-05: 16 mg via ORAL
  Filled 2024-08-05: qty 2

## 2024-08-05 MED ORDER — DIPHENHYDRAMINE 50 MG/ML INJECTION SOLUTION: 50.0000 mg | Freq: Once | INTRAMUSCULAR | Status: DC | PRN

## 2024-08-05 MED ORDER — SODIUM CHLORIDE 0.9 % IV BOLUS: 500.0000 mL | INJECTION | Freq: Once | Status: DC | PRN

## 2024-08-05 MED ORDER — HYDROCORTISONE SOD SUCCINATE 100 MG/2 ML VIAL WRAPPER: 100.0000 mg | Freq: Once | INTRAMUSCULAR | Status: DC | PRN

## 2024-08-05 MED ORDER — PROCHLORPERAZINE MALEATE 10 MG TABLET: 10.0000 mg | ORAL_TABLET | Freq: Once | ORAL | Status: DC | PRN

## 2024-08-05 MED ORDER — ALBUTEROL SULFATE 2.5 MG/3 ML (0.083 %) SOLUTION FOR NEBULIZATION: 2.5000 mg | INHALATION_SOLUTION | Freq: Once | RESPIRATORY_TRACT | Status: DC | PRN

## 2024-08-05 MED ORDER — SODIUM CHLORIDE 0.9% FLUSH BAG - 250 ML: INTRAVENOUS | Status: DC | PRN

## 2024-08-05 MED ORDER — PACLITAXEL-PROTEIN BOUND IVPB
90.0000 mg/m2 | INJECTION | Freq: Once | INTRAVENOUS | Status: AC
  Administered 2024-08-05: 180 mg via INTRAVENOUS
  Administered 2024-08-05: 0 mg via INTRAVENOUS
  Filled 2024-08-05: qty 36

## 2024-08-05 MED ORDER — DIPHENHYDRAMINE 50 MG/ML INJECTION SOLUTION: 25.0000 mg | Freq: Once | INTRAMUSCULAR | Status: DC | PRN

## 2024-08-05 MED ORDER — EPINEPHRINE 1 MG/ML (1 ML) INJECTION SOLUTION: 0.3000 mg | Freq: Once | INTRAMUSCULAR | Status: DC | PRN

## 2024-08-05 MED ORDER — PROMETHAZINE-DM 6.25 MG-15 MG/5 ML ORAL SYRUP: 10.0000 mL | ORAL_SOLUTION | Freq: Four times a day (QID) | ORAL | 1 refills | Status: AC | PRN

## 2024-08-05 MED ORDER — FAMOTIDINE (PF) 20 MG/2 ML INTRAVENOUS SOLUTION: 20.0000 mg | Freq: Once | INTRAVENOUS | Status: DC | PRN

## 2024-08-05 MED ORDER — MEPERIDINE (PF) 25 MG/ML INJECTION SOLUTION: 12.5000 mg | Freq: Once | INTRAMUSCULAR | Status: DC | PRN

## 2024-08-05 MED ORDER — DEXTROSE 5% IN WATER (D5W) FLUSH BAG - 250 ML: INTRAVENOUS | Status: DC | PRN

## 2024-08-05 NOTE — Nurses Notes (Signed)
 1441 - Pt arrived ambulatory to OP ONC for treatment, pt saw provider and had labs drawn prior to arrival, waiting on labs to result, assessment complete, VS/S. Jonette Kitty, RN  [phone] -Labs resulted and pt meets requirements to have treatment, port accessed, blood return present, flushed without difficulty, transparent bordered dressing applied. Jonette Kitty, RN  [phone] - 16 mg Zofran  PO given as pre med. Jonette Kitty, RN  [phone] - Abraxane  180 mg infusion started at this time, will continue to monitor. Jonette Kitty, RN  1640 - Abraxane  infusion complete, VS/S, line flushing at this time. Jonette Kitty, RN  [phone] - Line flushed without difficulty, blood return present, flushed 20 cc NS, port de accessed, pressure dressing applied. Jonette Kitty, RN  1700 - Pt left unit ambulatory without complaints and to have labs drawn on 08/09/2024 at the Oncology Office. Jonette Kitty, RN

## 2024-08-05 NOTE — Nursing Note (Signed)
 Patient discussed side effects with Dr Moses. She has been having nausea and heartburn, patient to add Prilosec or Nexium OTC. New prescription for Phenergan  Syrup sent to pharmacy. Patient complaints of bilateral leg edema, provider discussed starting low dose HCTZ. Patient states Magic Mouth wash is helping. Patient is also experiencing numbness and tingling in hands, without pain and muscle aches and fatigue. Dr Moses discussed decreasing dose by 10% to help with side effects and patient would like to decrease dose at this time.

## 2024-08-05 NOTE — Progress Notes (Signed)
 Department of Hematology/Oncology  Progress Note   Name: Melanie Snyder  FMW:[id number]  Date of Birth: [birth year]  Encounter Date: 08/05/2024    REFERRING PROVIDER:  Steen Lenis, MD  [address]  [place],  [place] [address]    REASON FOR OFFICE VISIT:  Breast Cancer     HISTORY OF PRESENT ILLNESS:  Melanie Snyder is a 68 y.o. female who presents today for assistance with managing metastatic breast cancer.    The patient has a history of infiltrating ductal carcinoma of the right breast.  She was stage III at the time of initial diagnosis.  T3 N2 M0, grade 2.  ER positive and HER2 Neu negative.  She underwent adjuvant therapy with Adriamycin and Cytoxan followed by paclitaxel .  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      She had adjuvant radiation therapy, which completed in January of 2021.  She has been on treatment with adjuvant letrozole  since January of 2021.  Late last year she was found to have circulating tumor DNA present.  This was followed by an imaging study which showed a left humerus metastatic deposit as well as a possible lesion in the lumbar spine.    01/24/2024: The patient is here for follow up of metastatic breast cancer.  She states that she tried taking the Aromasin , but ended up having issues with the burning sensation around her growing and waist area.  She states that it was terrible and has discontinued that medication.  She has been taking the Kisqali  without any problems.    02/21/2024: The patient is here for follow up of metastatic breast cancer.  She is to begin treatment with Faslodex  and Kisqali  today.    03/20/2024: The patient is here for follow up of metastatic breast cancer.  She has been on treatment with Faslodex  and Kisqali .  She states that she is tolerating treatment well, and her most recent Signatera test shows impressive improvement.    05/20/2024: The patient is here for follow up of metastatic  breast cancer.  She is doing well lately.  She did have a relatively recent imaging study which had shown a possible lesion in the liver, although after changing treatment it appears that the lesion is no longer visible.  She has seen Radiation Oncology and they are monitoring the situation as well.    06/20/2024: the patient is here for follow-up of metastatic breast cancer. She is been on treatment with Faslodex  and Kisqali . She recently had an MRI of the abdomen which showed new hepatic metastases which were not present on the PET/CT scan that was performed a couple months ago.    07/01/2024: the patient is here for follow-up of metastatic breast cancer. She is motivated to initiate the new treatment today.    08/05/2024:  The patient is here for follow up of metastatic breast cancer.  She is currently on Abraxane .  She is complaining of some issues of neuropathy and nausea.    ROS:   Pertinent review of systems as discussed in HPI    HISTORY:  Past Medical History:   Diagnosis Date    Breast CA     RIGHT CHEMO/RADIATION    Breast cancer 01/11/2022    Cardiac arrhythmia     Hyperlipidemia     Hypothyroidism     Malignant neoplasm of right female breast     Osteopenia  Past Surgical History:   Procedure Laterality Date    CESAREAN SECTION N/A     HX BREAST BIOPSY Right     6 months of chemo and radiation after that    HX BREAST LUMPECTOMY Right     CHEMO/RADIATION    HX BREAST REDUCTION Left          Social History     Socioeconomic History    Marital status: Married     Spouse name: Not on file    Number of children: Not on file    Years of education: Not on file    Highest education level: Not on file   Occupational History    Not on file   Tobacco Use    Smoking status: Never    Smokeless tobacco: Never   Vaping Use    Vaping status: Never Used   Substance and Sexual Activity    Alcohol  use: Never    Drug use: Never    Sexual activity: Not Currently   Other Topics Concern    Not on file   Social History  Narrative    Not on file     Social Determinants of Health     Financial Resource Strain: Not on file   Transportation Needs: Not on file   Social Connections: Not on file   Intimate Partner Violence: Not on file   Housing Stability: Not on file     Family Medical History:       Problem Relation (Age of Onset)    Cancer Mother, Maternal Grandfather    Coronary Artery Disease Paternal Aunt, Paternal Uncle    No Known Problems Father, Sister, Brother, Maternal Grandmother, Paternal Grandmother, Paternal Grandfather, Daughter, Son, Maternal Aunt, Maternal Uncle, Other            Current Outpatient Medications   Medication Sig    calcium carbonate/vitamin D3 (CALTRATE-600 PLUS VITAMIN D3 ORAL) Take 1 Tablet by mouth Daily    cetirizine (ZYRTEC) 10 mg Oral Tablet Take 1 Tablet (10 mg total) by mouth Once per day as needed    diphenhydrAMINE  HCl (BENADRYL ) 12.5 mg Oral Tablet, Chewable     famotidine  (PEPCID ) 20 mg Oral Tablet Take 1 Tablet (20 mg total) by mouth Every evening    hydroCHLOROthiazide  (MICROZIDE ) 12.5 mg Oral Capsule Take 1 Capsule (12.5 mg total) by mouth Daily    levothyroxine (SYNTHROID) 112 mcg Oral Tablet Take 1 Tablet (112 mcg total) by mouth Every morning    lidocaine -prilocaine (EMLA) 2.5-2.5 % Cream APPLY CREAM TOPICALLY TO AFFECTED AREA TWICE DAILY AS NEEDED    Magic Mouthwash Swish and spit 10 mL Every 2 hours as needed for Sore throat Contains lidocaine  viscous 2%, diphenhydramine  12.5 mg/5 ml, Maalox. Mix 1:1:1    meloxicam (MOBIC) 15 mg Oral Tablet Take 1 Tablet (15 mg total) by mouth Daily    ondansetron  (ZOFRAN  ODT) 8 mg Oral Tablet, Rapid Dissolve Place 1 Tablet (8 mg total) under the tongue Every 8 hours as needed for Nausea/Vomiting    pravastatin (PRAVACHOL) 40 mg Oral Tablet Take 1 Tablet (40 mg total) by mouth Daily    promethazine  (PHENERGAN ) 25 mg Oral Tablet TAKE 1 TABLET BY MOUTH EVERY 6 HOURS FOR NAUSEA    promethazine -dextromethorphan (PHENERGAN -DM) 6.25-15 mg/5 mL Oral Syrup  Take 10 mL by mouth Four times a day as needed for Cough     Allergies   Allergen Reactions    Amoxicillin      Sore throat  PHYSICAL EXAM:  Most Recent Vitals    Flowsheet Row Office Visit from 12/28/2023 in Hematology/Oncology, [facility]   Temperature 36.8 C (98.2 F) filed at... 12/28/2023 0847   Heart Rate 105 filed at... 12/28/2023 0847   Respiratory Rate --   BP (Non-Invasive) 148/85 filed at... 12/28/2023 0847   SpO2 99 % filed at... 12/28/2023 0847   Height 1.6 m (5' 3) filed at... 12/28/2023 0847   Weight 96.8 kg (213 lb 6.4 oz) filed at... 12/28/2023 0847   BMI (Calculated) 37.88 filed at... 12/28/2023 0847   BSA (Calculated) 2.07 filed at... 12/28/2023 0847      ECOG Status: (0) Fully active, able to carry on all predisease performance without restriction   Physical Exam    DIAGNOSTIC DATA:  No results found for this or any previous visit (from the past 82479 hours).    LABS:   CBC  Diff   Lab Results   Component Value Date/Time    WBC 2.7 (L) 08/05/2024 01:46 PM    HGB 11.7 08/05/2024 01:46 PM    HCT 33.0 08/05/2024 01:46 PM    PLTCNT 248 08/05/2024 01:46 PM    RBC 3.16 (L) 08/05/2024 01:46 PM    MCV 104.4 (H) 08/05/2024 01:46 PM    MCHC 35.6 08/05/2024 01:46 PM    MCH 37.1 (H) 08/05/2024 01:46 PM    RDW 14.9 08/05/2024 01:46 PM    MPV 8.7 08/05/2024 01:46 PM    Lab Results   Component Value Date/Time    PMNS 56 08/05/2024 01:46 PM    LYMPHOCYTES 24 08/05/2024 01:46 PM    EOSINOPHIL 0 (L) 08/05/2024 01:46 PM    MONOCYTES 19 (H) 08/05/2024 01:46 PM    BASOPHILS 1 08/05/2024 01:46 PM    BASOPHILS 0.00 08/05/2024 01:46 PM    PMNABS 1.50 (L) 08/05/2024 01:46 PM    LYMPHSABS 0.60 (L) 08/05/2024 01:46 PM    EOSABS 0.00 08/05/2024 01:46 PM    MONOSABS 0.50 08/05/2024 01:46 PM    BASABS 0.02 08/01/2024 04:11 PM            Comprehensive Metabolic Profile    Lab Results   Component Value Date    SODIUM 137 08/05/2024    POTASSIUM 4.8 08/05/2024    CHLORIDE 107 08/05/2024    CO2 25 08/05/2024     ANIONGAP 5 08/05/2024    BUN 15 08/05/2024    CREATININE 0.65 08/05/2024    ALBUMIN 4.3 08/05/2024    CALCIUM 8.5 (L) 08/05/2024    GLUCOSENF 106 08/05/2024    ALKPHOS 83 08/05/2024    ALT 17 08/05/2024    AST 36 08/05/2024    TOTBILIRUBIN 0.7 08/05/2024    TOTALPROTEIN 6.4 08/05/2024          BASIC METABOLIC PANEL  Lab Results   Component Value Date    SODIUM 137 08/05/2024    POTASSIUM 4.8 08/05/2024    CHLORIDE 107 08/05/2024    CO2 25 08/05/2024    ANIONGAP 5 08/05/2024    BUN 15 08/05/2024    CREATININE 0.65 08/05/2024    BUNCRRATIO 23 (H) 08/05/2024    GFR 97 08/05/2024    CALCIUM 8.5 (L) 08/05/2024    GLUCOSENF 106 08/05/2024           ASSESSMENT:  Problem List Items Addressed This Visit          Oncology    Breast cancer - Primary        ICD-10-CM  1. Breast cancer  C50.919          TREATMENT HISTORY:  Letrozole   Exemestane /palbociclib (did not tolerate exemestane )  Fulvestrant /palbociclib (with plan to add Inavoisib in the near future)    PLAN:   1. All relevant medical records were reviewed including available pertinent provider notes, procedure notes, imaging, laboratory, and pathology.   2. All pertinent labs and/or imaging were reviewed with the patient.   3. 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  ER positive, HER2 Neu negative.  She has developed a recurrence manifesting as letrozole  failure.  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  She was recently changed to Abraxane , and we do not yet have a tumor marker to determine whether treatment is efficacious.  We will therefore continue treatment and follow up with the lab when it becomes available.  Because of the PIK3CA mutation, she might be a candidate for apelisib as a maintenance therapy after obtaining maximal benefit from abraxane .   4. Nausea: We will try promethazine .    5. Neuropathy:  This is relatively minor at this time and we will monitor it  expectantly.  6. Mouth sores: Magic mouthwash.    Nathanel LITTIE Holts was given the chance to ask questions, and these were answered to their satisfaction. The patient is welcome to call with any questions or concerns in the meantime.     On the day of the encounter, a total of 35 minutes was spent on this patient encounter including review of historical information, examination, documentation and post-visit activities.   Return in about 4 weeks (around 09/02/2024).     Lynwood Pipes, MD  08/05/2024 , 18:50    The patient's insurance company bears full legal and financial responsibility resulting from any deviations that they cause to my recommended treatment plan.    CC:  Etta Caldron [place], PA-C  [address]  [place] [address]    Steen Lenis, MD  [address]  [place],  [place] [address]    This note was partially generated using MModal Fluency Direct system, and there may be some incorrect words, spellings, and punctuation that were not noted in checking the note before saving.

## 2024-08-07 ENCOUNTER — Ambulatory Visit (HOSPITAL_COMMUNITY): Payer: Self-pay

## 2024-08-07 ENCOUNTER — Encounter (INDEPENDENT_AMBULATORY_CARE_PROVIDER_SITE_OTHER): Payer: Self-pay | Admitting: HEMATOLOGY-ONCOLOGY

## 2024-08-08 LAB — CA 27,29: CA 27.29: 148 U/mL — ABNORMAL HIGH (ref <38)

## 2024-08-09 ENCOUNTER — Ambulatory Visit
Admission: RE | Admit: 2024-08-09 | Discharge: 2024-08-09 | Disposition: A | Discharge status: Home / Self Care | Source: Ambulatory Visit | Attending: HEMATOLOGY-ONCOLOGY | Admitting: HEMATOLOGY-ONCOLOGY

## 2024-08-09 ENCOUNTER — Other Ambulatory Visit: Payer: Self-pay

## 2024-08-09 ENCOUNTER — Encounter (HOSPITAL_COMMUNITY): Payer: Self-pay | Admitting: HEMATOLOGY-ONCOLOGY

## 2024-08-09 ENCOUNTER — Encounter (INDEPENDENT_AMBULATORY_CARE_PROVIDER_SITE_OTHER): Payer: Self-pay | Admitting: HEMATOLOGY-ONCOLOGY

## 2024-08-09 ENCOUNTER — Ambulatory Visit (INDEPENDENT_AMBULATORY_CARE_PROVIDER_SITE_OTHER)
Admission: RE | Admit: 2024-08-09 | Discharge: 2024-08-09 | Disposition: A | Source: Ambulatory Visit | Attending: HEMATOLOGY-ONCOLOGY

## 2024-08-09 VITALS — BP 131/76 | HR 90 | Temp 98.4°F | Resp 17 | Ht 64.02 in | Wt 219.6 lb

## 2024-08-09 DIAGNOSIS — C50919 Malignant neoplasm of unspecified site of unspecified female breast: Secondary | ICD-10-CM

## 2024-08-09 DIAGNOSIS — T451X5A Adverse effect of antineoplastic and immunosuppressive drugs, initial encounter: Secondary | ICD-10-CM | POA: Insufficient documentation

## 2024-08-09 DIAGNOSIS — D701 Agranulocytosis secondary to cancer chemotherapy: Secondary | ICD-10-CM | POA: Insufficient documentation

## 2024-08-09 LAB — MANUAL DIFFERENTIAL
BASOPHIL %: 2 % (ref 0–3)
BASOPHIL ABSOLUTE: 0.04 x10ˆ3/uL (ref 0.00–0.30)
BASOPHILS MANUAL: 2
EOSINOPHIL %: 2 % (ref 0–7)
EOSINOPHIL ABSOLUTE: 0.04 x10ˆ3/uL (ref 0.00–0.80)
EOSINOPHILS MANUAL: 2
LYMPHOCYTE %: 16 % — ABNORMAL LOW (ref 25–45)
LYMPHOCYTE ABSOLUTE: 0.29 x10ˆ3/uL — ABNORMAL LOW (ref 1.10–5.00)
LYMPHOCYTES MANUAL: 16
MONOCYTE %: 3 % (ref 0–12)
MONOCYTE ABSOLUTE: 0.05 x10ˆ3/uL (ref 0.00–0.30)
MONOCYTES MANUAL: 3
NEUTROPHIL %: 77 % — ABNORMAL HIGH (ref 40–76)
NEUTROPHIL ABSOLUTE: 1.39 x10ˆ3/uL — ABNORMAL LOW (ref 1.80–8.40)
NEUTROPHILS MANUAL: 77
PLATELET MORPHOLOGY COMMENT: NORMAL
RBC MORPHOLOGY COMMENT: NORMAL
SCHISTOCYTES: ABSENT
TOTAL CELLS COUNTED [#] IN BLOOD: 100
WBC: 1.8 x10ˆ3/uL

## 2024-08-09 LAB — CBC
HCT: 30.8 % — ABNORMAL LOW (ref 31.2–41.9)
HGB: 10.8 g/dL — ABNORMAL LOW (ref 10.9–14.3)
MCH: 36.5 pg — ABNORMAL HIGH (ref 24.7–32.8)
MCHC: 35.1 g/dL (ref 32.3–35.6)
MCV: 103.8 fL — ABNORMAL HIGH (ref 75.5–95.3)
MPV: 9.6 fL (ref 7.9–10.8)
PLATELETS: 166 x10ˆ3/uL (ref 140–440)
RBC: 2.97 x10ˆ6/uL — ABNORMAL LOW (ref 3.63–4.92)
RDW: 14.8 % (ref 12.3–17.7)
WBC: 1.8 x10ˆ3/uL — CL (ref 3.8–11.8)

## 2024-08-09 MED ORDER — FILGRASTIM-SNDZ 480 MCG/0.8 ML INJECTION SYRINGE
5.0000 ug/kg | INJECTION | Freq: Once | INTRAMUSCULAR | Status: AC
  Administered 2024-08-09: 480 ug via SUBCUTANEOUS
  Filled 2024-08-09: qty 0.8

## 2024-08-09 NOTE — Nurses Notes (Signed)
 1235-Patient here from doctor's office for Zarxio  injection.  Vital signs are stable.Cherene Benders, RN  1256-ZARXIO  injection given subq to left upper arm.Cherene Benders, RN  1301-Patient informed of appointment for tomorrow.  Patient checking out now.Cherene Benders, RN

## 2024-08-09 NOTE — Nursing Note (Signed)
 Informed Dr. Moses of patient's WBC 1.8 and ANC 1.39; patient to receive Neupogen  injection today and tomorrow per new orders; infusion staff aware and patient sent to infusion today for injection.

## 2024-08-10 ENCOUNTER — Other Ambulatory Visit: Payer: Self-pay

## 2024-08-10 ENCOUNTER — Ambulatory Visit
Admission: RE | Admit: 2024-08-10 | Discharge: 2024-08-10 | Disposition: A | Discharge status: Home / Self Care | Payer: Self-pay | Source: Ambulatory Visit | Attending: HEMATOLOGY-ONCOLOGY | Admitting: HEMATOLOGY-ONCOLOGY

## 2024-08-10 VITALS — BP 125/82 | HR 94 | Temp 97.1°F | Resp 17

## 2024-08-10 DIAGNOSIS — Z7689 Persons encountering health services in other specified circumstances: Secondary | ICD-10-CM | POA: Insufficient documentation

## 2024-08-10 DIAGNOSIS — T451X5A Adverse effect of antineoplastic and immunosuppressive drugs, initial encounter: Secondary | ICD-10-CM | POA: Insufficient documentation

## 2024-08-10 DIAGNOSIS — D701 Agranulocytosis secondary to cancer chemotherapy: Secondary | ICD-10-CM | POA: Insufficient documentation

## 2024-08-10 MED ORDER — FILGRASTIM-SNDZ 480 MCG/0.8 ML INJECTION SYRINGE
5.0000 ug/kg | INJECTION | Freq: Once | INTRAMUSCULAR | Status: AC
  Administered 2024-08-10: 480 ug via SUBCUTANEOUS
  Filled 2024-08-10: qty 0.8

## 2024-08-11 ENCOUNTER — Encounter (HOSPITAL_COMMUNITY): Payer: Self-pay | Admitting: HEMATOLOGY-ONCOLOGY

## 2024-08-11 ENCOUNTER — Ambulatory Visit (HOSPITAL_COMMUNITY): Payer: Self-pay

## 2024-08-12 ENCOUNTER — Encounter (HOSPITAL_COMMUNITY): Payer: Self-pay | Admitting: HEMATOLOGY-ONCOLOGY

## 2024-08-12 ENCOUNTER — Encounter (HOSPITAL_COMMUNITY): Payer: Self-pay

## 2024-08-12 ENCOUNTER — Ambulatory Visit
Admission: RE | Admit: 2024-08-12 | Discharge: 2024-08-12 | Disposition: A | Discharge status: Home / Self Care | Payer: Self-pay | Source: Ambulatory Visit | Attending: PHYSICIAN ASSISTANT | Admitting: PHYSICIAN ASSISTANT

## 2024-08-12 ENCOUNTER — Other Ambulatory Visit: Payer: Self-pay

## 2024-08-12 ENCOUNTER — Other Ambulatory Visit (INDEPENDENT_AMBULATORY_CARE_PROVIDER_SITE_OTHER): Payer: Self-pay | Admitting: HEMATOLOGY-ONCOLOGY

## 2024-08-12 VITALS — BP 158/97 | HR 94 | Temp 98.4°F | Resp 18 | Ht 63.0 in | Wt 216.4 lb

## 2024-08-12 DIAGNOSIS — C50919 Malignant neoplasm of unspecified site of unspecified female breast: Secondary | ICD-10-CM | POA: Insufficient documentation

## 2024-08-12 DIAGNOSIS — Z5111 Encounter for antineoplastic chemotherapy: Secondary | ICD-10-CM | POA: Insufficient documentation

## 2024-08-12 LAB — CBC WITH DIFF
BASOPHIL #: 0 x10ˆ3/uL (ref 0.00–0.10)
BASOPHIL %: 0 % (ref 0–1)
EOSINOPHIL #: 0.1 x10ˆ3/uL (ref 0.00–0.50)
EOSINOPHIL %: 1 % (ref 1–7)
HCT: 29.8 % — ABNORMAL LOW (ref 31.2–41.9)
HGB: 10.6 g/dL — ABNORMAL LOW (ref 10.9–14.3)
LYMPHOCYTE #: 0.9 x10ˆ3/uL — ABNORMAL LOW (ref 1.10–3.10)
LYMPHOCYTE %: 7 % — ABNORMAL LOW (ref 16–46)
MCH: 36.9 pg — ABNORMAL HIGH (ref 24.7–32.8)
MCHC: 35.5 g/dL (ref 32.3–35.6)
MCV: 103.9 fL — ABNORMAL HIGH (ref 75.5–95.3)
MONOCYTE #: 0.6 x10ˆ3/uL (ref 0.20–0.90)
MONOCYTE %: 5 % (ref 4–11)
MPV: 8.9 fL (ref 7.9–10.8)
NEUTROPHIL #: 11.8 x10ˆ3/uL — ABNORMAL HIGH (ref 1.90–8.20)
NEUTROPHIL %: 87 % — ABNORMAL HIGH (ref 43–77)
PLATELETS: 146 x10ˆ3/uL (ref 140–440)
RBC: 2.87 x10ˆ6/uL — ABNORMAL LOW (ref 3.63–4.92)
RDW: 14.4 % (ref 12.3–17.7)
WBC: 13.5 x10ˆ3/uL — ABNORMAL HIGH (ref 3.8–11.8)

## 2024-08-12 LAB — SCAN DIFFERENTIAL
PLATELET MORPHOLOGY COMMENT: NORMAL
SCHISTOCYTES: ABSENT

## 2024-08-12 MED ORDER — MEPERIDINE (PF) 25 MG/ML INJECTION SOLUTION: 12.5000 mg | Freq: Once | INTRAMUSCULAR | Status: DC | PRN

## 2024-08-12 MED ORDER — DIPHENHYDRAMINE 50 MG/ML INJECTION SOLUTION: 25.0000 mg | Freq: Once | INTRAMUSCULAR | Status: DC | PRN

## 2024-08-12 MED ORDER — HYDROCORTISONE SOD SUCCINATE 100 MG/2 ML VIAL WRAPPER: 100.0000 mg | Freq: Once | INTRAMUSCULAR | Status: DC | PRN

## 2024-08-12 MED ORDER — PACLITAXEL-PROTEIN BOUND IVPB
90.0000 mg/m2 | INJECTION | Freq: Once | INTRAVENOUS | Status: AC
  Administered 2024-08-12: 0 mg via INTRAVENOUS
  Administered 2024-08-12: 180 mg via INTRAVENOUS
  Filled 2024-08-12: qty 36

## 2024-08-12 MED ORDER — PROCHLORPERAZINE EDISYLATE 10 MG/2 ML (5 MG/ML) INJECTION SOLUTION: 10.0000 mg | Freq: Once | INTRAMUSCULAR | Status: DC | PRN

## 2024-08-12 MED ORDER — DIPHENHYDRAMINE 50 MG/ML INJECTION SOLUTION: 50.0000 mg | Freq: Once | INTRAMUSCULAR | Status: DC | PRN

## 2024-08-12 MED ORDER — ALBUTEROL SULFATE HFA 90 MCG/ACTUATION AEROSOL INHALER - RN: 2.0000 | Freq: Once | RESPIRATORY_TRACT | Status: DC | PRN

## 2024-08-12 MED ORDER — ONDANSETRON HCL 8 MG TABLET
16.0000 mg | ORAL_TABLET | Freq: Once | ORAL | Status: AC
  Administered 2024-08-12: 16 mg via ORAL
  Filled 2024-08-12: qty 2

## 2024-08-12 MED ORDER — SODIUM CHLORIDE 0.9% FLUSH BAG - 250 ML: INTRAVENOUS | Status: DC | PRN

## 2024-08-12 MED ORDER — DEXTROSE 5% IN WATER (D5W) FLUSH BAG - 250 ML: INTRAVENOUS | Status: DC | PRN

## 2024-08-12 MED ORDER — EPINEPHRINE 1 MG/ML (1 ML) INJECTION SOLUTION: 0.3000 mg | Freq: Once | INTRAMUSCULAR | Status: DC | PRN

## 2024-08-12 MED ORDER — ALBUTEROL SULFATE 2.5 MG/3 ML (0.083 %) SOLUTION FOR NEBULIZATION: 2.5000 mg | INHALATION_SOLUTION | Freq: Once | RESPIRATORY_TRACT | Status: DC | PRN

## 2024-08-12 MED ORDER — SODIUM CHLORIDE 0.9 % IV BOLUS: 500.0000 mL | INJECTION | Freq: Once | Status: DC | PRN

## 2024-08-12 MED ORDER — PROCHLORPERAZINE MALEATE 10 MG TABLET: 10.0000 mg | ORAL_TABLET | Freq: Once | ORAL | Status: DC | PRN

## 2024-08-12 MED ORDER — FAMOTIDINE (PF) 20 MG/2 ML INTRAVENOUS SOLUTION: 20.0000 mg | Freq: Once | INTRAVENOUS | Status: DC | PRN

## 2024-08-12 NOTE — Nurses Notes (Addendum)
 Melanie Snyder Pt ambulatory to OP Onc unit for tx. Port accessed with ease. Excellent blood return and flushes easily. Labs drawn from port and sent to lab via hospital tube system. VS obtained. Pt states she has no energy. Voices when she was receiving Taxol  years ago, she was given IV Decadron with her Taxol  tx. Pt questions if it may help with her lack of energy.  Message sent to Dr.Mackey via secure chat. Per Dr.Mackey, The half life is between 1 and 5 hours so it would be all wore off in like half a day. Pt aware and states she will discuss with Dr.Mackey at next office visit. Rexene Lipps, RN  Patient Assessment/Symptom Management Patient Has No MD Appointment Today   Key: (+) Symptom present           (-)  Symptom not present If Symptom is Positive(+) a Nursing Note is required   Edema -   Uncontrolled Nausea -   Vomiting -   Inability to eat/drink -   Mouth Sores -   Diarrhea -   Constipation (? Last BM) -   Fatigue that interferes with ADL's - pt tires easily   Numbness/Tingling -change -   Other -   Fever/Signs & Symptoms of infection -   Nurse Initials TL RN   1553 Zofran  16 mg po given. Rexene Lipps, RN  [phone] Abraxane  180 mg IV infusion started. Rexene Lipps, RN  [phone] Abraxane  infusion completed. Line flushed with NS. Pt tolerated tx without difficulty. Rexene Lipps, RN  [phone] VS obtained. Port flushed with 30 ml NS. [place]an d/c'ed. Site clear. Drsg applied. Rexene Lipps, RN  [phone] Pt left OP Onc unit ambulatory. No adverse reaction noted. Rexene Lipps, RN

## 2024-08-17 ENCOUNTER — Encounter (HOSPITAL_COMMUNITY): Payer: Self-pay | Admitting: HEMATOLOGY-ONCOLOGY

## 2024-08-19 ENCOUNTER — Ambulatory Visit
Admission: RE | Admit: 2024-08-19 | Discharge: 2024-08-19 | Disposition: A | Discharge status: Home / Self Care | Payer: Self-pay | Source: Ambulatory Visit | Attending: PHYSICIAN ASSISTANT | Admitting: PHYSICIAN ASSISTANT

## 2024-08-19 ENCOUNTER — Other Ambulatory Visit: Payer: Self-pay

## 2024-08-19 ENCOUNTER — Encounter (HOSPITAL_COMMUNITY): Payer: Self-pay

## 2024-08-19 VITALS — BP 126/69 | HR 91 | Temp 97.9°F | Resp 18 | Wt 219.0 lb

## 2024-08-19 DIAGNOSIS — Z5111 Encounter for antineoplastic chemotherapy: Secondary | ICD-10-CM | POA: Insufficient documentation

## 2024-08-19 DIAGNOSIS — C50919 Malignant neoplasm of unspecified site of unspecified female breast: Secondary | ICD-10-CM | POA: Insufficient documentation

## 2024-08-19 LAB — CBC WITH DIFF
BASOPHIL #: 0 x10ˆ3/uL (ref 0.00–0.10)
BASOPHIL %: 1 % (ref 0–1)
EOSINOPHIL #: 0.2 x10ˆ3/uL (ref 0.00–0.50)
EOSINOPHIL %: 9 % — ABNORMAL HIGH (ref 1–7)
HCT: 27.1 % — ABNORMAL LOW (ref 31.2–41.9)
HGB: 9.7 g/dL — ABNORMAL LOW (ref 10.9–14.3)
LYMPHOCYTE %: 24 % (ref 16–46)
MCH: 36.4 pg — ABNORMAL HIGH (ref 24.7–32.8)
MCHC: 35.7 g/dL — ABNORMAL HIGH (ref 32.3–35.6)
MCV: 102 fL — ABNORMAL HIGH (ref 75.5–95.3)
MONOCYTE #: 0.2 x10ˆ3/uL (ref 0.20–0.90)
MONOCYTE %: 7 % (ref 4–11)
MPV: 10.3 fL (ref 7.9–10.8)
NEUTROPHIL #: 1.3 x10ˆ3/uL — ABNORMAL LOW (ref 1.90–8.20)
NEUTROPHIL %: 59 % (ref 43–77)
PLATELETS: 117 x10ˆ3/uL — ABNORMAL LOW (ref 140–440)
RBC: 2.66 x10ˆ6/uL — ABNORMAL LOW (ref 3.63–4.92)
RDW: 15.1 % (ref 12.3–17.7)
WBC: 2.3 x10ˆ3/uL — ABNORMAL LOW (ref 3.8–11.8)

## 2024-08-19 MED ORDER — SODIUM CHLORIDE 0.9 % IV BOLUS: 500.0000 mL | INJECTION | Freq: Once | Status: DC | PRN

## 2024-08-19 MED ORDER — SODIUM CHLORIDE 0.9% FLUSH BAG - 250 ML: INTRAVENOUS | Status: DC | PRN

## 2024-08-19 MED ORDER — PROCHLORPERAZINE EDISYLATE 10 MG/2 ML (5 MG/ML) INJECTION SOLUTION: 10.0000 mg | Freq: Once | INTRAMUSCULAR | Status: DC | PRN

## 2024-08-19 MED ORDER — ALBUTEROL SULFATE 2.5 MG/3 ML (0.083 %) SOLUTION FOR NEBULIZATION: 2.5000 mg | INHALATION_SOLUTION | Freq: Once | RESPIRATORY_TRACT | Status: DC | PRN

## 2024-08-19 MED ORDER — DIPHENHYDRAMINE 50 MG/ML INJECTION SOLUTION: 25.0000 mg | Freq: Once | INTRAMUSCULAR | Status: DC | PRN

## 2024-08-19 MED ORDER — ALBUTEROL SULFATE HFA 90 MCG/ACTUATION AEROSOL INHALER - RN: 2.0000 | Freq: Once | RESPIRATORY_TRACT | Status: DC | PRN

## 2024-08-19 MED ORDER — DIPHENHYDRAMINE 50 MG/ML INJECTION SOLUTION: 50.0000 mg | Freq: Once | INTRAMUSCULAR | Status: DC | PRN

## 2024-08-19 MED ORDER — FAMOTIDINE (PF) 20 MG/2 ML INTRAVENOUS SOLUTION: 20.0000 mg | Freq: Once | INTRAVENOUS | Status: DC | PRN

## 2024-08-19 MED ORDER — ONDANSETRON HCL 8 MG TABLET
16.0000 mg | ORAL_TABLET | Freq: Once | ORAL | Status: AC
  Administered 2024-08-19: 16 mg via ORAL
  Filled 2024-08-19: qty 2

## 2024-08-19 MED ORDER — EPINEPHRINE 1 MG/ML (1 ML) INJECTION SOLUTION: 0.3000 mg | Freq: Once | INTRAMUSCULAR | Status: DC | PRN

## 2024-08-19 MED ORDER — MEPERIDINE (PF) 25 MG/ML INJECTION SOLUTION: 12.5000 mg | Freq: Once | INTRAMUSCULAR | Status: DC | PRN

## 2024-08-19 MED ORDER — PACLITAXEL-PROTEIN BOUND IVPB
90.0000 mg/m2 | INJECTION | Freq: Once | INTRAVENOUS | Status: AC
  Administered 2024-08-19: 180 mg via INTRAVENOUS
  Administered 2024-08-19: 0 mg via INTRAVENOUS
  Filled 2024-08-19: qty 36

## 2024-08-19 MED ORDER — PROCHLORPERAZINE MALEATE 10 MG TABLET: 10.0000 mg | ORAL_TABLET | Freq: Once | ORAL | Status: DC | PRN

## 2024-08-19 MED ORDER — DEXTROSE 5% IN WATER (D5W) FLUSH BAG - 250 ML: INTRAVENOUS | Status: DC | PRN

## 2024-08-19 MED ORDER — HYDROCORTISONE SOD SUCCINATE 100 MG/2 ML VIAL WRAPPER: 100.0000 mg | Freq: Once | INTRAMUSCULAR | Status: DC | PRN

## 2024-08-19 NOTE — Nurses Notes (Addendum)
 1426 Pt ambulatory to OP Onc unit for tx. Port accessed with ease. Excellent blood return and flushes easily. Labs drawn from port and sent to lab via hospital tube system. VS obtained. Melanie Melanie Lipps, RN  Patient Assessment/Symptom Management Patient Has No MD Appointment Today   Key: (+) Symptom present           (-)  Symptom not present If Symptom is Positive(+) a Nursing Note is required   Edema -   Uncontrolled Nausea -   Vomiting -   Inability to eat/drink -   Mouth Sores -   Diarrhea -   Constipation (? Last BM) -   Fatigue that interferes with ADL's - pt tires easily   Numbness/Tingling -change -   Other -   Fever/Signs & Symptoms of infection -   Nurse Initials TL RN   1534 Zofran  16 mg po given. Melanie Lipps, RN  [phone] Abraxane  180 mg IV infusion started. Melanie Lipps, RN  [phone] Abraxane  infusion completed. Line flushed with NS. Pt tolerated tx without difficulty. Melanie Lipps, RN  1710 VS obtained. Port flushed with 30 ml NS. Melanie Snyder d/c'ed. Site clear. Drsg applied. Melanie Lipps, RN  [phone] Pt left OP Onc unit ambulatory. No adverse reaction noted. Pt to return to OP Onc unit on 08/21/23 for Zarxio  injections x 3 days. Pt aware injection must be given 24 hrs after chemo finished. Pt verbalized understanding and will arrive to OP Onc unit at 4:45 pm. Melanie Warzecha, RN

## 2024-08-20 ENCOUNTER — Other Ambulatory Visit: Payer: Self-pay

## 2024-08-20 ENCOUNTER — Encounter (HOSPITAL_COMMUNITY): Payer: Self-pay | Admitting: HEMATOLOGY-ONCOLOGY

## 2024-08-20 ENCOUNTER — Ambulatory Visit
Admission: RE | Admit: 2024-08-20 | Discharge: 2024-08-20 | Disposition: A | Discharge status: Home / Self Care | Source: Ambulatory Visit | Attending: PHYSICIAN ASSISTANT | Admitting: PHYSICIAN ASSISTANT

## 2024-08-20 VITALS — BP 132/71 | HR 99 | Temp 98.7°F | Resp 18

## 2024-08-20 DIAGNOSIS — T451X5A Adverse effect of antineoplastic and immunosuppressive drugs, initial encounter: Secondary | ICD-10-CM | POA: Insufficient documentation

## 2024-08-20 DIAGNOSIS — D701 Agranulocytosis secondary to cancer chemotherapy: Secondary | ICD-10-CM | POA: Insufficient documentation

## 2024-08-20 DIAGNOSIS — Z7689 Persons encountering health services in other specified circumstances: Secondary | ICD-10-CM | POA: Insufficient documentation

## 2024-08-20 MED ORDER — FILGRASTIM-SNDZ 480 MCG/0.8 ML INJECTION SYRINGE
5.0000 ug/kg | INJECTION | Freq: Once | INTRAMUSCULAR | Status: AC
  Administered 2024-08-20: 480 ug via SUBCUTANEOUS
  Filled 2024-08-20: qty 0.8

## 2024-08-20 NOTE — Nurses Notes (Signed)
 [id number] - Pt arrived to the unit ambulatory for Xario injection.  Pt received injection in left upper arm subQ.  Pt tolerated well.  Pt informed her of her appt tomorrow.  Pt verbalized understanding.  Pt left unit ambulatory.

## 2024-08-21 ENCOUNTER — Ambulatory Visit
Admission: RE | Admit: 2024-08-21 | Discharge: 2024-08-21 | Disposition: A | Discharge status: Home / Self Care | Source: Ambulatory Visit | Attending: HEMATOLOGY-ONCOLOGY | Admitting: HEMATOLOGY-ONCOLOGY

## 2024-08-21 VITALS — BP 146/84 | HR 102 | Temp 98.7°F | Resp 18

## 2024-08-21 DIAGNOSIS — T451X5A Adverse effect of antineoplastic and immunosuppressive drugs, initial encounter: Secondary | ICD-10-CM | POA: Insufficient documentation

## 2024-08-21 DIAGNOSIS — D701 Agranulocytosis secondary to cancer chemotherapy: Secondary | ICD-10-CM | POA: Insufficient documentation

## 2024-08-21 DIAGNOSIS — Z7689 Persons encountering health services in other specified circumstances: Secondary | ICD-10-CM | POA: Insufficient documentation

## 2024-08-21 MED ORDER — FILGRASTIM-SNDZ 480 MCG/0.8 ML INJECTION SYRINGE
5.0000 ug/kg | INJECTION | Freq: Once | INTRAMUSCULAR | Status: AC
  Administered 2024-08-21: 480 ug via SUBCUTANEOUS
  Filled 2024-08-21: qty 0.8

## 2024-08-21 NOTE — Nurses Notes (Addendum)
 [id number]-Arrived to OP ONC ambulatory.  Here for Day 2 of 3 Zarxio  480mcg injections.  Does verbalize c/o being very fatigued.  No other voiced complaints offered.  Zarxio  administered sub-q in right upper arm.  No voiced complaints offered.  Left OP ONC ambulatory with no acute distress. Will return tomorrow for next injection.  Alan Borrow, RN

## 2024-08-22 ENCOUNTER — Ambulatory Visit
Admission: RE | Admit: 2024-08-22 | Discharge: 2024-08-22 | Disposition: A | Discharge status: Home / Self Care | Source: Ambulatory Visit | Attending: HEMATOLOGY-ONCOLOGY | Admitting: HEMATOLOGY-ONCOLOGY

## 2024-08-22 ENCOUNTER — Other Ambulatory Visit: Payer: Self-pay

## 2024-08-22 VITALS — BP 142/77 | HR 105 | Temp 96.7°F | Resp 18

## 2024-08-22 DIAGNOSIS — T451X5A Adverse effect of antineoplastic and immunosuppressive drugs, initial encounter: Secondary | ICD-10-CM | POA: Insufficient documentation

## 2024-08-22 DIAGNOSIS — D701 Agranulocytosis secondary to cancer chemotherapy: Secondary | ICD-10-CM | POA: Insufficient documentation

## 2024-08-22 MED ORDER — FILGRASTIM-SNDZ 480 MCG/0.8 ML INJECTION SYRINGE
5.0000 ug/kg | INJECTION | Freq: Once | INTRAMUSCULAR | Status: AC
  Administered 2024-08-22: 480 ug via SUBCUTANEOUS
  Filled 2024-08-22: qty 0.8

## 2024-08-22 NOTE — Nurses Notes (Addendum)
 [id number]-Arrived to OP ONC ambulatory.  Here for Day 3 of 3 Zarxio  480mcg injections.  Continues to be very tired and c/o being achy from the injection.   No voiced complaints offered. Zarxio  administered sub-q in left upper arm.    Left OP ONC ambulatory with no acute distress.   Alan Borrow, RN

## 2024-08-23 ENCOUNTER — Encounter (HOSPITAL_COMMUNITY): Payer: Self-pay | Admitting: HEMATOLOGY-ONCOLOGY

## 2024-08-26 ENCOUNTER — Ambulatory Visit (HOSPITAL_COMMUNITY): Payer: Self-pay

## 2024-08-27 ENCOUNTER — Ambulatory Visit
Admission: RE | Admit: 2024-08-27 | Discharge: 2024-08-27 | Disposition: A | Discharge status: Still In Hospital | Source: Ambulatory Visit | Attending: RADIATION ONCOLOGY | Admitting: RADIATION ONCOLOGY

## 2024-08-27 ENCOUNTER — Other Ambulatory Visit: Payer: Self-pay

## 2024-08-27 ENCOUNTER — Ambulatory Visit: Attending: RADIATION ONCOLOGY

## 2024-08-27 DIAGNOSIS — C50919 Malignant neoplasm of unspecified site of unspecified female breast: Secondary | ICD-10-CM | POA: Insufficient documentation

## 2024-08-27 DIAGNOSIS — C50911 Malignant neoplasm of unspecified site of right female breast: Secondary | ICD-10-CM | POA: Insufficient documentation

## 2024-08-27 DIAGNOSIS — Z923 Personal history of irradiation: Secondary | ICD-10-CM

## 2024-08-27 DIAGNOSIS — Z79633 Long term (current) use of mitotic inhibitor: Secondary | ICD-10-CM | POA: Insufficient documentation

## 2024-08-27 DIAGNOSIS — G8918 Other acute postprocedural pain: Secondary | ICD-10-CM

## 2024-08-27 DIAGNOSIS — C50411 Malignant neoplasm of upper-outer quadrant of right female breast: Secondary | ICD-10-CM

## 2024-08-27 MED ORDER — HYDROCODONE 5 MG-ACETAMINOPHEN 325 MG TABLET: 1.0000 | ORAL_TABLET | Freq: Four times a day (QID) | ORAL | 0 refills | Status: AC | PRN

## 2024-08-27 NOTE — Progress Notes (Signed)
 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    Assessment:  69 year old with recurrent metastatic breast cancer.   She has no new complaints.  Tolerating abraxane .      Recommendations:  I have recommended she continue on her systemic therapy.  We will check her Signatera levels today.  We will add additional imaging if her Signatera rises.  She is instructed to call in the interim should she have problems or concerns.    The indications, time course, benefits, risks and side effects of radiation treatment were explained to the patient, and her questions were answered to her apparent satisfaction. I encouraged her to contact us  at any time should she have any further questions or concerns. I personally saw and examined the patient, and reviewed all prior imaging and pathologic findings with her. On the day of the encounter, a total of 30 minutes was spent on this patient encounter including review of historical data, examination, documentation and post-visit activities.  This time documented excludes procedural time.   FULL NOTE     Interval History :   Melanie Snyder is a 69 y.o. female with a history of recurrent metastatic breast cancer.  She has begun Abraxane  due to progression on prior medications.  She has tolerated it well with mild dose reduction though she required neutropenia support.  Treatment gives her bone aches.    Pain Assessment:  Minimal    Past Medical/Surgical History:  Past Medical History:   Diagnosis Date    Breast CA     RIGHT CHEMO/RADIATION    Breast cancer 01/11/2022    Cardiac arrhythmia     Hyperlipidemia     Hypothyroidism     Malignant neoplasm of right female breast     Osteopenia          Past Surgical History:   Procedure Laterality Date    CESAREAN SECTION N/A     HX BREAST BIOPSY Right      6 months of chemo and radiation after that    HX BREAST LUMPECTOMY Right     CHEMO/RADIATION    HX BREAST REDUCTION Left            Family History:   Family Medical History:       Problem Relation (Age of Onset)    Cancer Mother, Maternal Grandfather    Coronary Artery Disease Paternal Aunt, Paternal Uncle    No Known Problems Father, Sister, Brother, Maternal Grandmother, Paternal Grandmother, Paternal Grandfather, Daughter, Son, Maternal Aunt, Maternal Uncle, Other              Social History:   Social History     Socioeconomic History    Marital status: Married     Spouse name: Not on file    Number of children: Not on file    Years of education: Not on file    Highest education level: Not on file   Occupational History    Not on file   Tobacco Use    Smoking status: Never    Smokeless tobacco: Never   Vaping Use    Vaping status: Never Used   Substance and Sexual Activity    Alcohol  use: Never    Drug use: Never  Sexual activity: Not Currently   Other Topics Concern    Not on file   Social History Narrative    Not on file     Social Determinants of Health     Financial Resource Strain: Not on file   Transportation Needs: Not on file   Social Connections: Not on file   Intimate Partner Violence: Not on file   Housing Stability: Not on file       ALLERGIES:   Allergies[1]     MEDICATIONS:   Current Outpatient Medications   Medication Instructions    calcium carbonate/vitamin D3 (CALTRATE-600 PLUS VITAMIN D3 ORAL) Take 1 Tablet by mouth Daily    cetirizine (ZYRTEC) 10 mg, DAILY PRN    diphenhydrAMINE  HCl (BENADRYL ) 12.5 mg Oral Tablet, Chewable     famotidine  (PEPCID ) 20 mg, Oral, EVERY EVENING    hydroCHLOROthiazide  (MICROZIDE ) 12.5 mg, Oral, Daily    HYDROcodone -acetaminophen  (NORCO) 5-325 mg Oral Tablet 1 Tablet, Oral, EVERY 6 HOURS PRN    levothyroxine (SYNTHROID) 112 mcg Oral Tablet Take 1 Tablet (112 mcg total) by mouth Every morning    lidocaine -prilocaine (EMLA) 2.5-2.5 % Cream APPLY CREAM TOPICALLY TO  AFFECTED AREA TWICE DAILY AS NEEDED    Magic Mouthwash 10 mL, Swish & Spit, EVERY 2 HOURS PRN, Contains lidocaine  viscous 2%, diphenhydramine  12.5 mg/5 ml, Maalox. Mix 1:1:1    meloxicam (MOBIC) 15 mg, Daily    ondansetron  (ZOFRAN  ODT) 8 mg, Sublingual, EVERY 8 HOURS PRN    pravastatin (PRAVACHOL) 40 mg, Daily    promethazine  (PHENERGAN ) 25 mg Oral Tablet TAKE 1 TABLET BY MOUTH EVERY 6 HOURS FOR NAUSEA    promethazine -dextromethorphan (PHENERGAN -DM) 6.25-15 mg/5 mL Oral Syrup 10 mL, Oral, 4 TIMES DAILY PRN        REVIEW OF SYSTEMS  Pertinent review of systems as discussed in Interval History.      Objective:   There were no vitals filed for this visit.          PHYSICAL EXAMINATION  Physical Exam  Constitutional:       Appearance: Normal appearance.   HENT:      Mouth/Throat:      Mouth: Mucous membranes are moist.   Eyes:      Extraocular Movements: Extraocular movements intact.      Pupils: Pupils are equal, round, and reactive to light.   Pulmonary:      Effort: Pulmonary effort is normal.   Musculoskeletal:         General: Normal range of motion.   Skin:     General: Skin is warm and dry.   Neurological:      General: No focal deficit present.      Mental Status: She is alert and oriented to person, place, and time.   Psychiatric:         Mood and Affect: Mood normal.         Behavior: Behavior normal.          LABS/IMAGING: All relevant labs and imaging were reviewed as per HPI.      Fairy Patten, MD 08/27/2024, 11:10    rr:[id number]@            [1]   Allergies  Allergen Reactions    Amoxicillin      Sore throat

## 2024-08-29 ENCOUNTER — Other Ambulatory Visit (INDEPENDENT_AMBULATORY_CARE_PROVIDER_SITE_OTHER): Payer: Self-pay | Admitting: HEMATOLOGY-ONCOLOGY

## 2024-08-29 DIAGNOSIS — C50919 Malignant neoplasm of unspecified site of unspecified female breast: Secondary | ICD-10-CM

## 2024-09-02 ENCOUNTER — Encounter (INDEPENDENT_AMBULATORY_CARE_PROVIDER_SITE_OTHER): Payer: Self-pay | Admitting: HEMATOLOGY-ONCOLOGY

## 2024-09-02 ENCOUNTER — Ambulatory Visit (INDEPENDENT_AMBULATORY_CARE_PROVIDER_SITE_OTHER): Payer: Self-pay | Admitting: HEMATOLOGY-ONCOLOGY

## 2024-09-02 ENCOUNTER — Ambulatory Visit (INDEPENDENT_AMBULATORY_CARE_PROVIDER_SITE_OTHER)
Admission: RE | Admit: 2024-09-02 | Discharge: 2024-09-02 | Disposition: A | Payer: Self-pay | Source: Ambulatory Visit | Attending: HEMATOLOGY-ONCOLOGY

## 2024-09-02 ENCOUNTER — Ambulatory Visit
Admission: RE | Admit: 2024-09-02 | Discharge: 2024-09-02 | Disposition: A | Discharge status: Home / Self Care | Payer: Self-pay | Source: Ambulatory Visit | Attending: HEMATOLOGY-ONCOLOGY | Admitting: HEMATOLOGY-ONCOLOGY

## 2024-09-02 ENCOUNTER — Other Ambulatory Visit: Payer: Self-pay

## 2024-09-02 ENCOUNTER — Encounter (HOSPITAL_COMMUNITY): Payer: Self-pay | Admitting: HEMATOLOGY-ONCOLOGY

## 2024-09-02 VITALS — BP 143/86 | HR 92 | Temp 97.4°F | Ht 63.0 in | Wt 216.9 lb

## 2024-09-02 VITALS — BP 139/73 | HR 90 | Temp 96.9°F | Resp 17

## 2024-09-02 DIAGNOSIS — Z170 Estrogen receptor positive status [ER+]: Secondary | ICD-10-CM

## 2024-09-02 DIAGNOSIS — R110 Nausea: Secondary | ICD-10-CM | POA: Insufficient documentation

## 2024-09-02 DIAGNOSIS — Z1732 Human epidermal growth factor receptor 2 negative status: Secondary | ICD-10-CM | POA: Insufficient documentation

## 2024-09-02 DIAGNOSIS — Z79633 Long term (current) use of mitotic inhibitor: Secondary | ICD-10-CM | POA: Insufficient documentation

## 2024-09-02 DIAGNOSIS — Z5111 Encounter for antineoplastic chemotherapy: Secondary | ICD-10-CM | POA: Insufficient documentation

## 2024-09-02 DIAGNOSIS — C50919 Malignant neoplasm of unspecified site of unspecified female breast: Secondary | ICD-10-CM

## 2024-09-02 DIAGNOSIS — C50911 Malignant neoplasm of unspecified site of right female breast: Secondary | ICD-10-CM | POA: Insufficient documentation

## 2024-09-02 DIAGNOSIS — Z79899 Other long term (current) drug therapy: Secondary | ICD-10-CM | POA: Insufficient documentation

## 2024-09-02 DIAGNOSIS — K1379 Other lesions of oral mucosa: Secondary | ICD-10-CM | POA: Insufficient documentation

## 2024-09-02 DIAGNOSIS — G629 Polyneuropathy, unspecified: Secondary | ICD-10-CM | POA: Insufficient documentation

## 2024-09-02 DIAGNOSIS — C7951 Secondary malignant neoplasm of bone: Secondary | ICD-10-CM | POA: Insufficient documentation

## 2024-09-02 LAB — CBC WITH DIFF
BASOPHIL #: 0 x10ˆ3/uL (ref 0.00–0.10)
BASOPHIL %: 1 % (ref 0–1)
EOSINOPHIL #: 0.1 x10ˆ3/uL (ref 0.00–0.50)
EOSINOPHIL %: 2 % (ref 1–7)
HCT: 31.9 % (ref 31.2–41.9)
HGB: 11 g/dL (ref 10.9–14.3)
LYMPHOCYTE #: 0.6 x10ˆ3/uL — ABNORMAL LOW (ref 1.10–3.10)
LYMPHOCYTE %: 19 % (ref 16–46)
MCH: 36.1 pg — ABNORMAL HIGH (ref 24.7–32.8)
MCHC: 34.4 g/dL (ref 32.3–35.6)
MCV: 104.8 fL — ABNORMAL HIGH (ref 75.5–95.3)
MONOCYTE #: 0.5 x10ˆ3/uL (ref 0.20–0.90)
MONOCYTE %: 17 % — ABNORMAL HIGH (ref 4–11)
MPV: 9.4 fL (ref 7.9–10.8)
NEUTROPHIL #: 1.9 x10ˆ3/uL (ref 1.90–8.20)
NEUTROPHIL %: 62 % (ref 43–77)
PLATELETS: 162 x10ˆ3/uL (ref 140–440)
RBC: 3.05 x10ˆ6/uL — ABNORMAL LOW (ref 3.63–4.92)
RDW: 16.7 % (ref 12.3–17.7)
WBC: 3.1 x10ˆ3/uL — ABNORMAL LOW (ref 3.8–11.8)

## 2024-09-02 LAB — COMPREHENSIVE METABOLIC PANEL, NON-FASTING
ALBUMIN/GLOBULIN RATIO: 2 — ABNORMAL HIGH (ref 0.8–1.4)
ALBUMIN: 4.1 g/dL (ref 3.5–5.7)
ALKALINE PHOSPHATASE: 81 U/L (ref 34–104)
ALT (SGPT): 15 U/L (ref 7–52)
ANION GAP: 5 mmol/L (ref 4–13)
AST (SGOT): 34 U/L (ref 13–39)
BILIRUBIN TOTAL: 0.6 mg/dL (ref 0.3–1.0)
BUN/CREA RATIO: 15 (ref 6–22)
BUN: 9 mg/dL (ref 7–25)
CALCIUM, CORRECTED: 8.5 mg/dL — ABNORMAL LOW (ref 8.9–10.8)
CALCIUM: 8.6 mg/dL (ref 8.6–10.3)
CHLORIDE: 108 mmol/L — ABNORMAL HIGH (ref 98–107)
CO2 TOTAL: 26 mmol/L (ref 21–31)
CREATININE: 0.62 mg/dL (ref 0.60–1.30)
ESTIMATED GFR: 98 mL/min/1.73mˆ2 (ref >59)
GLOBULIN: 2.1 (ref 2.0–3.5)
GLUCOSE: 96 mg/dL (ref 74–109)
OSMOLALITY, CALCULATED: 276 mosm/kg (ref 270–290)
POTASSIUM: 4.6 mmol/L (ref 3.5–5.1)
PROTEIN TOTAL: 6.2 g/dL — ABNORMAL LOW (ref 6.4–8.9)
SODIUM: 139 mmol/L (ref 136–145)

## 2024-09-02 MED ORDER — SODIUM CHLORIDE 0.9% FLUSH BAG - 250 ML: INTRAVENOUS | Status: DC | PRN

## 2024-09-02 MED ORDER — SODIUM CHLORIDE 0.9 % IV BOLUS: 500.0000 mL | INJECTION | Freq: Once | Status: DC | PRN

## 2024-09-02 MED ORDER — FAMOTIDINE (PF) 20 MG/2 ML INTRAVENOUS SOLUTION: 20.0000 mg | Freq: Once | INTRAVENOUS | Status: DC | PRN

## 2024-09-02 MED ORDER — ONDANSETRON HCL 8 MG TABLET
16.0000 mg | ORAL_TABLET | Freq: Once | ORAL | Status: AC
  Administered 2024-09-02: 16 mg via ORAL
  Filled 2024-09-02: qty 2

## 2024-09-02 MED ORDER — EPINEPHRINE 1 MG/ML (1 ML) INJECTION SOLUTION: 0.3000 mg | Freq: Once | INTRAMUSCULAR | Status: DC | PRN

## 2024-09-02 MED ORDER — DEXTROSE 5% IN WATER (D5W) FLUSH BAG - 250 ML: INTRAVENOUS | Status: DC | PRN

## 2024-09-02 MED ORDER — PACLITAXEL-PROTEIN BOUND IVPB
90.0000 mg/m2 | INJECTION | Freq: Once | INTRAVENOUS | Status: AC
  Administered 2024-09-02: 180 mg via INTRAVENOUS
  Administered 2024-09-02: 0 mg via INTRAVENOUS
  Filled 2024-09-02: qty 36

## 2024-09-02 MED ORDER — DIPHENHYDRAMINE 50 MG/ML INJECTION SOLUTION: 50.0000 mg | Freq: Once | INTRAMUSCULAR | Status: DC | PRN

## 2024-09-02 MED ORDER — PROCHLORPERAZINE EDISYLATE 10 MG/2 ML (5 MG/ML) INJECTION SOLUTION: 10.0000 mg | Freq: Once | INTRAMUSCULAR | Status: DC | PRN

## 2024-09-02 MED ORDER — MEPERIDINE (PF) 25 MG/ML INJECTION SOLUTION: 12.5000 mg | Freq: Once | INTRAMUSCULAR | Status: DC | PRN

## 2024-09-02 MED ORDER — PROCHLORPERAZINE MALEATE 10 MG TABLET: 10.0000 mg | ORAL_TABLET | Freq: Once | ORAL | Status: DC | PRN

## 2024-09-02 MED ORDER — HYDROCORTISONE SOD SUCCINATE 100 MG/2 ML VIAL WRAPPER: 100.0000 mg | Freq: Once | INTRAMUSCULAR | Status: DC | PRN

## 2024-09-02 MED ORDER — ALBUTEROL SULFATE HFA 90 MCG/ACTUATION AEROSOL INHALER - RN: 2.0000 | Freq: Once | RESPIRATORY_TRACT | Status: DC | PRN

## 2024-09-02 MED ORDER — DIPHENHYDRAMINE 50 MG/ML INJECTION SOLUTION: 25.0000 mg | Freq: Once | INTRAMUSCULAR | Status: DC | PRN

## 2024-09-02 MED ORDER — ALBUTEROL SULFATE 2.5 MG/3 ML (0.083 %) SOLUTION FOR NEBULIZATION: 2.5000 mg | INHALATION_SOLUTION | Freq: Once | RESPIRATORY_TRACT | Status: DC | PRN

## 2024-09-02 NOTE — Progress Notes (Signed)
 Department of Hematology/Oncology  Progress Note   Name: Melanie Snyder  FMW:[id number]  Date of Birth: 1956-03-21  Encounter Date: 09/02/2024    REFERRING PROVIDER:  Steen Lenis, MD  [address]  [place],  [place] [address]    REASON FOR OFFICE VISIT:  Breast Cancer     HISTORY OF PRESENT ILLNESS:  Melanie Snyder is a 69 y.o. female who presents today for assistance with managing metastatic breast cancer.    The patient has a history of infiltrating ductal carcinoma of the right breast.  She was stage III at the time of initial diagnosis.  T3 N2 M0, grade 2.  ER positive and HER2 Neu negative.  She underwent adjuvant therapy with Adriamycin and Cytoxan followed by paclitaxel .  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      She had adjuvant radiation therapy, which completed in January of 2021.  She has been on treatment with adjuvant letrozole  since January of 2021.  Late last year she was found to have circulating tumor DNA present.  This was followed by an imaging study which showed a left humerus metastatic deposit as well as a possible lesion in the lumbar spine.    01/24/2024: The patient is here for follow up of metastatic breast cancer.  She states that she tried taking the Aromasin , but ended up having issues with the burning sensation around her growing and waist area.  She states that it was terrible and has discontinued that medication.  She has been taking the Kisqali  without any problems.    02/21/2024: The patient is here for follow up of metastatic breast cancer.  She is to begin treatment with Faslodex  and Kisqali  today.    03/20/2024: The patient is here for follow up of metastatic breast cancer.  She has been on treatment with Faslodex  and Kisqali .  She states that she is tolerating treatment well, and her most recent Signatera test shows impressive improvement.    05/20/2024: The patient is here for follow up of metastatic  breast cancer.  She is doing well lately.  She did have a relatively recent imaging study which had shown a possible lesion in the liver, although after changing treatment it appears that the lesion is no longer visible.  She has seen Radiation Oncology and they are monitoring the situation as well.    06/20/2024: the patient is here for follow-up of metastatic breast cancer. She is been on treatment with Faslodex  and Kisqali . She recently had an MRI of the abdomen which showed new hepatic metastases which were not present on the PET/CT scan that was performed a couple months ago.    07/01/2024: the patient is here for follow-up of metastatic breast cancer. She is motivated to initiate the new treatment today.    08/05/2024:  The patient is here for follow up of metastatic breast cancer.  She is currently on Abraxane .  She is complaining of some issues of neuropathy and nausea.    09/02/2024: The patient is here for follow up of breast cancer.  She has metastatic disease and has been on treatment with Abraxane .  She states that the dose reduction that was instituted last time did increase the tolerability of the regimen significantly.    ROS:   Pertinent review of systems as discussed in HPI    HISTORY:  Past Medical History:   Diagnosis Date    Breast CA     RIGHT  CHEMO/RADIATION    Breast cancer 01/11/2022    Cardiac arrhythmia     Hyperlipidemia     Hypothyroidism     Malignant neoplasm of right female breast     Osteopenia          Past Surgical History:   Procedure Laterality Date    CESAREAN SECTION N/A     HX BREAST BIOPSY Right     6 months of chemo and radiation after that    HX BREAST LUMPECTOMY Right     CHEMO/RADIATION    HX BREAST REDUCTION Left          Social History     Socioeconomic History    Marital status: Married     Spouse name: Not on file    Number of children: Not on file    Years of education: Not on file    Highest education level: Not on file   Occupational History    Not on file   Tobacco  Use    Smoking status: Never    Smokeless tobacco: Never   Vaping Use    Vaping status: Never Used   Substance and Sexual Activity    Alcohol  use: Never    Drug use: Never    Sexual activity: Not Currently   Other Topics Concern    Not on file   Social History Narrative    Not on file     Social Determinants of Health     Financial Resource Strain: Not on file   Transportation Needs: Not on file   Social Connections: Not on file   Intimate Partner Violence: Not on file   Housing Stability: Not on file     Family Medical History:       Problem Relation (Age of Onset)    Cancer Mother, Maternal Grandfather    Coronary Artery Disease Paternal Aunt, Paternal Uncle    No Known Problems Father, Sister, Brother, Maternal Grandmother, Paternal Grandmother, Paternal Grandfather, Daughter, Son, Maternal Aunt, Maternal Uncle, Other            Current Outpatient Medications   Medication Sig    calcium carbonate/vitamin D3 (CALTRATE-600 PLUS VITAMIN D3 ORAL) Take 1 Tablet by mouth Daily    cetirizine (ZYRTEC) 10 mg Oral Tablet Take 1 Tablet (10 mg total) by mouth Once per day as needed    diphenhydrAMINE  HCl (BENADRYL ) 12.5 mg Oral Tablet, Chewable     famotidine  (PEPCID ) 20 mg Oral Tablet Take 1 Tablet (20 mg total) by mouth Every evening    hydroCHLOROthiazide  (MICROZIDE ) 12.5 mg Oral Capsule Take 1 Capsule (12.5 mg total) by mouth Daily    HYDROcodone -acetaminophen  (NORCO) 5-325 mg Oral Tablet Take 1 Tablet by mouth Every 6 hours as needed for Pain for up to 30 days    levothyroxine (SYNTHROID) 112 mcg Oral Tablet Take 1 Tablet (112 mcg total) by mouth Every morning    lidocaine -prilocaine (EMLA) 2.5-2.5 % Cream APPLY CREAM TOPICALLY TO AFFECTED AREA TWICE DAILY AS NEEDED    Magic Mouthwash Swish and spit 10 mL Every 2 hours as needed for Sore throat Contains lidocaine  viscous 2%, diphenhydramine  12.5 mg/5 ml, Maalox. Mix 1:1:1    meloxicam (MOBIC) 15 mg Oral Tablet Take 1 Tablet (15 mg total) by mouth Daily    ondansetron   (ZOFRAN  ODT) 8 mg Oral Tablet, Rapid Dissolve Place 1 Tablet (8 mg total) under the tongue Every 8 hours as needed for Nausea/Vomiting    pravastatin (PRAVACHOL) 40 mg Oral  Tablet Take 1 Tablet (40 mg total) by mouth Daily    promethazine  (PHENERGAN ) 25 mg Oral Tablet TAKE 1 TABLET BY MOUTH EVERY 6 HOURS FOR NAUSEA    promethazine -dextromethorphan (PHENERGAN -DM) 6.25-15 mg/5 mL Oral Syrup Take 10 mL by mouth Four times a day as needed for Cough     Allergies   Allergen Reactions    Amoxicillin      Sore throat          PHYSICAL EXAM:  Most Recent Vitals    Flowsheet Row Office Visit from 12/28/2023 in Hematology/Oncology, [facility]   Temperature 36.8 C (98.2 F) filed at... 12/28/2023 0847   Heart Rate 105 filed at... 12/28/2023 0847   Respiratory Rate --   BP (Non-Invasive) 148/85 filed at... 12/28/2023 0847   SpO2 99 % filed at... 12/28/2023 0847   Height 1.6 m (5' 3) filed at... 12/28/2023 0847   Weight 96.8 kg (213 lb 6.4 oz) filed at... 12/28/2023 0847   BMI (Calculated) 37.88 filed at... 12/28/2023 0847   BSA (Calculated) 2.07 filed at... 12/28/2023 0847      ECOG Status: (0) Fully active, able to carry on all predisease performance without restriction   Physical Exam    DIAGNOSTIC DATA:  No results found for this or any previous visit (from the past 82479 hours).    LABS:   CBC  Diff   Lab Results   Component Value Date/Time    WBC 3.1 (L) 09/02/2024 11:27 AM    HGB 11.0 09/02/2024 11:27 AM    HCT 31.9 09/02/2024 11:27 AM    PLTCNT 162 09/02/2024 11:27 AM    RBC 3.05 (L) 09/02/2024 11:27 AM    MCV 104.8 (H) 09/02/2024 11:27 AM    MCHC 34.4 09/02/2024 11:27 AM    MCH 36.1 (H) 09/02/2024 11:27 AM    RDW 16.7 09/02/2024 11:27 AM    MPV 9.4 09/02/2024 11:27 AM    Lab Results   Component Value Date/Time    PMNS 62 09/02/2024 11:27 AM    LYMPHOCYTES 19 09/02/2024 11:27 AM    EOSINOPHIL 2 09/02/2024 11:27 AM    MONOCYTES 17 (H) 09/02/2024 11:27 AM    BASOPHILS 1 09/02/2024 11:27 AM    BASOPHILS 0.00  09/02/2024 11:27 AM    PMNABS 1.90 09/02/2024 11:27 AM    LYMPHSABS 0.60 (L) 09/02/2024 11:27 AM    EOSABS 0.10 09/02/2024 11:27 AM    MONOSABS 0.50 09/02/2024 11:27 AM    BASABS 0.04 08/09/2024 10:55 AM            Comprehensive Metabolic Profile    Lab Results   Component Value Date    SODIUM 139 09/02/2024    POTASSIUM 4.6 09/02/2024    CHLORIDE 108 (H) 09/02/2024    CO2 26 09/02/2024    ANIONGAP 5 09/02/2024    BUN 9 09/02/2024    CREATININE 0.62 09/02/2024    ALBUMIN 4.1 09/02/2024    CALCIUM 8.6 09/02/2024    GLUCOSENF 96 09/02/2024    ALKPHOS 81 09/02/2024    ALT 15 09/02/2024    AST 34 09/02/2024    TOTBILIRUBIN 0.6 09/02/2024    TOTALPROTEIN 6.2 (L) 09/02/2024          BASIC METABOLIC PANEL  Lab Results   Component Value Date    SODIUM 139 09/02/2024    POTASSIUM 4.6 09/02/2024    CHLORIDE 108 (H) 09/02/2024    CO2 26 09/02/2024    ANIONGAP 5 09/02/2024  BUN 9 09/02/2024    CREATININE 0.62 09/02/2024    BUNCRRATIO 15 09/02/2024    GFR 98 09/02/2024    CALCIUM 8.6 09/02/2024    GLUCOSENF 96 09/02/2024           ASSESSMENT:  Problem List Items Addressed This Visit          Oncology    Breast cancer - Primary        ICD-10-CM    1. Breast cancer  C50.919          TREATMENT HISTORY:  Letrozole   Exemestane /palbociclib (did not tolerate exemestane )  Fulvestrant /palbociclib (with plan to add Inavoisib in the near future)    PLAN:   1. All relevant medical records were reviewed including available pertinent provider notes, procedure notes, imaging, laboratory, and pathology.   2. All pertinent labs and/or imaging were reviewed with the patient.   3. 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  ER positive, HER2 Neu negative.  She has developed a recurrence manifesting as letrozole  failure.  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  She was recently changed to Abraxane , and we do not yet have a tumor marker showing improvement.   The one from today will be helpful to know whether the current regimen is efficacious.  We will therefore continue treatment and follow up with the lab when it becomes available.  Because of the PIK3CA mutation, she might be a candidate for apelisib as a maintenance therapy after obtaining maximal benefit from abraxane .   4. Nausea: We will try promethazine .    5. Neuropathy:  This is relatively minor at this time and we will monitor it expectantly.  6. Mouth sores: Magic mouthwash.    Nathanel LITTIE Holts was given the chance to ask questions, and these were answered to their satisfaction. The patient is welcome to call with any questions or concerns in the meantime.     On the day of the encounter, a total of 35 minutes was spent on this patient encounter including review of historical information, examination, documentation and post-visit activities.   Return in about 4 weeks (around 09/30/2024).     Lynwood Pipes, MD  09/02/2024 , 17:54    The patient's insurance company bears full legal and financial responsibility resulting from any deviations that they cause to my recommended treatment plan.    CC:  Etta Caldron [place], PA-C  [address]  [place] [address]    Steen Lenis, MD  [address]  [place],  [place] [address]    This note was partially generated using MModal Fluency Direct system, and there may be some incorrect words, spellings, and punctuation that were not noted in checking the note before saving.

## 2024-09-02 NOTE — Nursing Note (Signed)
 Patient states that the chemo reduction helped with nausea. All questions answered. Only complaint is aches and pain after prolia  injection. Treatment today. Waiting on tumor marker.

## 2024-09-02 NOTE — Nurses Notes (Signed)
 1149-Patient to unit for chemo treatment.  Patient reports fatigue following treatments but has no other complaints.  Lung sounds are clear and normal heart sounds present.  Vital signs are stable.Cherene Benders, RN     Patient Assessment/Symptom Management Patient Has No MD Appointment Today   Key: (+) Symptom present           (-)  Symptom not present If Symptom is Positive(+) a Nursing Note is required   Edema -   Nausea -   Vomiting -   Inability to eat/drink -   Mouth Sores -   Diarrhea -   Constipation (? Last BM) -   Fatigue that interferes with ADL's +   Numbness/Tingling -change -   Other -   Fever/Signs & Symptoms of infection -   Nurse Initials KB         1200-PAC accessed and blood return obtained.Cherene Benders, RN  1214-Zofran  scanned and given per order.Cherene Benders, RN  1239-ABRAXANE  infusion started.Cherene Benders, RN  1309-ABRAXANE  infusion completed.  Patient tolerated well.Cherene Benders, RN  1405-PAC flushed and deaccessed.  Blood return confirmed prior to Sylvan Benders, RN  1408-Patient checking out now.  Vitals stable.Cherene Benders, RN

## 2024-09-04 LAB — CA 27,29: CA 27.29: 120 U/mL — ABNORMAL HIGH (ref <38)

## 2024-09-09 ENCOUNTER — Ambulatory Visit
Admission: RE | Admit: 2024-09-09 | Discharge: 2024-09-09 | Disposition: A | Discharge status: Home / Self Care | Payer: Self-pay | Source: Ambulatory Visit | Attending: HEMATOLOGY-ONCOLOGY | Admitting: HEMATOLOGY-ONCOLOGY

## 2024-09-09 ENCOUNTER — Encounter (HOSPITAL_COMMUNITY): Payer: Self-pay

## 2024-09-09 ENCOUNTER — Other Ambulatory Visit: Payer: Self-pay

## 2024-09-09 VITALS — BP 160/76 | HR 90 | Temp 96.1°F | Resp 18

## 2024-09-09 DIAGNOSIS — Z5111 Encounter for antineoplastic chemotherapy: Secondary | ICD-10-CM | POA: Insufficient documentation

## 2024-09-09 DIAGNOSIS — C50919 Malignant neoplasm of unspecified site of unspecified female breast: Secondary | ICD-10-CM | POA: Insufficient documentation

## 2024-09-09 LAB — CBC WITH DIFF
BASOPHIL #: 0 10*3/uL (ref 0.00–0.10)
BASOPHIL %: 1 % (ref 0–1)
EOSINOPHIL #: 0 10*3/uL (ref 0.00–0.50)
EOSINOPHIL %: 2 % (ref 1–7)
HCT: 28 % — ABNORMAL LOW (ref 31.2–41.9)
HGB: 9.8 g/dL — ABNORMAL LOW (ref 10.9–14.3)
LYMPHOCYTE #: 0.5 10*3/uL — ABNORMAL LOW (ref 1.10–3.10)
LYMPHOCYTE %: 22 % (ref 16–46)
MCH: 35.8 pg — ABNORMAL HIGH (ref 24.7–32.8)
MCHC: 35.2 g/dL (ref 32.3–35.6)
MCV: 101.8 fL — ABNORMAL HIGH (ref 75.5–95.3)
MONOCYTE #: 0.1 10*3/uL — ABNORMAL LOW (ref 0.20–0.90)
MONOCYTE %: 7 % (ref 4–11)
MPV: 9.1 fL (ref 7.9–10.8)
NEUTROPHIL #: 1.4 10*3/uL — ABNORMAL LOW (ref 1.90–8.20)
NEUTROPHIL %: 68 % (ref 43–77)
PLATELETS: 125 10*3/uL — ABNORMAL LOW (ref 140–440)
RBC: 2.75 10*6/uL — ABNORMAL LOW (ref 3.63–4.92)
RDW: 16.3 % (ref 12.3–17.7)
WBC: 2.1 10*3/uL — ABNORMAL LOW (ref 3.8–11.8)

## 2024-09-09 MED ORDER — SODIUM CHLORIDE 0.9 % IV BOLUS: 500.0000 mL | INJECTION | Freq: Once | Status: DC | PRN

## 2024-09-09 MED ORDER — ALBUTEROL SULFATE HFA 90 MCG/ACTUATION AEROSOL INHALER - RN: 2.0000 | Freq: Once | RESPIRATORY_TRACT | Status: DC | PRN

## 2024-09-09 MED ORDER — PROCHLORPERAZINE MALEATE 10 MG TABLET: 10.0000 mg | ORAL_TABLET | Freq: Once | ORAL | Status: DC | PRN

## 2024-09-09 MED ORDER — MEPERIDINE (PF) 25 MG/ML INJECTION SOLUTION: 12.5000 mg | Freq: Once | INTRAMUSCULAR | Status: DC | PRN

## 2024-09-09 MED ORDER — ONDANSETRON HCL 8 MG TABLET
16.0000 mg | ORAL_TABLET | Freq: Once | ORAL | Status: AC
  Administered 2024-09-09: 16 mg via ORAL
  Filled 2024-09-09: qty 2

## 2024-09-09 MED ORDER — DEXTROSE 5% IN WATER (D5W) FLUSH BAG - 250 ML: INTRAVENOUS | Status: DC | PRN

## 2024-09-09 MED ORDER — ALBUTEROL SULFATE 2.5 MG/3 ML (0.083 %) SOLUTION FOR NEBULIZATION: 2.5000 mg | INHALATION_SOLUTION | Freq: Once | RESPIRATORY_TRACT | Status: DC | PRN

## 2024-09-09 MED ORDER — DIPHENHYDRAMINE 50 MG/ML INJECTION SOLUTION: 25.0000 mg | Freq: Once | INTRAMUSCULAR | Status: DC | PRN

## 2024-09-09 MED ORDER — SODIUM CHLORIDE 0.9% FLUSH BAG - 250 ML: INTRAVENOUS | Status: DC | PRN

## 2024-09-09 MED ORDER — DIPHENHYDRAMINE 50 MG/ML INJECTION SOLUTION: 50.0000 mg | Freq: Once | INTRAMUSCULAR | Status: DC | PRN

## 2024-09-09 MED ORDER — PACLITAXEL-PROTEIN BOUND IVPB
90.0000 mg/m2 | INJECTION | Freq: Once | INTRAVENOUS | Status: AC
  Administered 2024-09-09: 180 mg via INTRAVENOUS
  Administered 2024-09-09: 0 mg via INTRAVENOUS
  Filled 2024-09-09: qty 36

## 2024-09-09 MED ORDER — PROCHLORPERAZINE EDISYLATE 10 MG/2 ML (5 MG/ML) INJECTION SOLUTION: 10.0000 mg | Freq: Once | INTRAMUSCULAR | Status: DC | PRN

## 2024-09-09 MED ORDER — FAMOTIDINE (PF) 20 MG/2 ML INTRAVENOUS SOLUTION: 20.0000 mg | Freq: Once | INTRAVENOUS | Status: DC | PRN

## 2024-09-09 MED ORDER — HYDROCORTISONE SOD SUCCINATE 100 MG/2 ML VIAL WRAPPER: 100.0000 mg | Freq: Once | INTRAMUSCULAR | Status: DC | PRN

## 2024-09-09 MED ORDER — EPINEPHRINE 1 MG/ML (1 ML) INJECTION SOLUTION: 0.3000 mg | Freq: Once | INTRAMUSCULAR | Status: DC | PRN

## 2024-09-09 NOTE — Nurses Notes (Signed)
 1149 - Pt arrived ambulatory to OP ONC for treatment, assessment complete, VS/S. Jonette Kitty, RN  1201 - [address] accessed, blood return present, flushed without difficulty, transparent bordered dressing applied, labs drawn and waiting for results. Jonette Kitty, RN  [phone] -  Labs resulted and pt meets requirements to have treatment, 16 mg Zofran  PO given as pre med. Jonette Kitty, RN  [phone] - Abraxane  180 mg infusion started at this time, will continue to monitor. Jonette Kitty, RN  [phone] - Abraxane  infusion complete, VS/S, line flushing at this time. Jonette Kitty, RN  [phone] - Line flushed without difficulty, blood return present, flushed 20 cc NS, port de accessed, pressure dressing applied. Jonette Kitty, RN  [phone] - Pt left unit ambulatory without complaints. Jonette Kitty, RN

## 2024-09-12 ENCOUNTER — Other Ambulatory Visit (INDEPENDENT_AMBULATORY_CARE_PROVIDER_SITE_OTHER): Payer: Self-pay | Admitting: HEMATOLOGY-ONCOLOGY

## 2024-09-13 ENCOUNTER — Ambulatory Visit (HOSPITAL_COMMUNITY)

## 2024-09-13 ENCOUNTER — Ambulatory Visit
Admission: RE | Admit: 2024-09-13 | Discharge: 2024-09-13 | Disposition: A | Payer: Self-pay | Source: Ambulatory Visit | Attending: HEMATOLOGY-ONCOLOGY

## 2024-09-13 ENCOUNTER — Other Ambulatory Visit: Payer: Self-pay

## 2024-09-13 VITALS — BP 111/62 | HR 105 | Temp 98.3°F | Resp 17

## 2024-09-13 DIAGNOSIS — D701 Agranulocytosis secondary to cancer chemotherapy: Secondary | ICD-10-CM

## 2024-09-13 LAB — CBC WITH DIFF
HCT: 29.1 % — ABNORMAL LOW (ref 31.2–41.9)
HGB: 10.1 g/dL — ABNORMAL LOW (ref 10.9–14.3)
MCH: 35.7 pg — ABNORMAL HIGH (ref 24.7–32.8)
MCHC: 34.6 g/dL (ref 32.3–35.6)
MCV: 103.2 fL — ABNORMAL HIGH (ref 75.5–95.3)
MPV: 10 fL (ref 7.9–10.8)
PLATELETS: 147 10*3/uL (ref 140–440)
RBC: 2.82 10*6/uL — ABNORMAL LOW (ref 3.63–4.92)
RDW: 16.1 % (ref 12.3–17.7)
WBC: 1.8 10*3/uL — CL (ref 3.8–11.8)

## 2024-09-13 LAB — MANUAL DIFFERENTIAL
EOSINOPHIL %: 8 % — ABNORMAL HIGH (ref 0–7)
EOSINOPHIL ABSOLUTE: 0.14 10*3/uL (ref 0.00–0.80)
EOSINOPHILS MANUAL: 8
LYMPHOCYTE %: 32 % (ref 25–45)
LYMPHOCYTE ABSOLUTE: 0.58 10*3/uL — ABNORMAL LOW (ref 1.10–5.00)
LYMPHOCYTES MANUAL: 32
MONOCYTE %: 1 % (ref 0–12)
MONOCYTE ABSOLUTE: 0.02 10*3/uL (ref 0.00–0.30)
MONOCYTES MANUAL: 1
NEUTROPHIL %: 59 % (ref 40–76)
NEUTROPHIL ABSOLUTE: 1.06 10*3/uL — ABNORMAL LOW (ref 1.80–8.40)
NEUTROPHILS MANUAL: 59
PLATELET MORPHOLOGY COMMENT: NORMAL
RBC MORPHOLOGY COMMENT: NORMAL
SCHISTOCYTES: ABSENT
TOTAL CELLS COUNTED [#] IN BLOOD: 100
WBC: 1.8 10*3/uL

## 2024-09-13 MED ORDER — FILGRASTIM-SNDZ 480 MCG/0.8 ML INJECTION SYRINGE
5.0000 ug/kg | INJECTION | Freq: Once | INTRAMUSCULAR | Status: AC
  Administered 2024-09-13: 480 ug via SUBCUTANEOUS
  Filled 2024-09-13: qty 0.8

## 2024-09-13 NOTE — Nurses Notes (Addendum)
 1530-Patient here for labs and possible Zarxio  injection.  Lab work collected.  Vitals are stable.Cherene Benders, RN  1622-ZARXIO  injection given subq to right upper arm.Cherene Benders, RN  1625-Patient checking out.  Patient informed of appointments for Saturday and Sunday.Cherene Benders, RN

## 2024-09-14 ENCOUNTER — Ambulatory Visit: Admission: RE | Admit: 2024-09-14 | Discharge: 2024-09-14 | Payer: Self-pay | Attending: HEMATOLOGY-ONCOLOGY

## 2024-09-14 VITALS — BP 128/68 | HR 96 | Temp 98.8°F | Resp 17

## 2024-09-14 DIAGNOSIS — D701 Agranulocytosis secondary to cancer chemotherapy: Secondary | ICD-10-CM

## 2024-09-14 MED ORDER — FILGRASTIM-SNDZ 480 MCG/0.8 ML INJECTION SYRINGE
5.0000 ug/kg | INJECTION | Freq: Once | INTRAMUSCULAR | Status: AC
  Administered 2024-09-14: 480 ug via SUBCUTANEOUS
  Filled 2024-09-14: qty 0.8

## 2024-09-14 NOTE — Nurses Notes (Signed)
 Patient arrived via ambulatory for her injection.  VSS Gave in the left arm.  Tolerated well.

## 2024-09-15 ENCOUNTER — Other Ambulatory Visit: Payer: Self-pay

## 2024-09-15 ENCOUNTER — Encounter (HOSPITAL_COMMUNITY): Payer: Self-pay | Admitting: HEMATOLOGY-ONCOLOGY

## 2024-09-15 ENCOUNTER — Ambulatory Visit: Admission: RE | Admit: 2024-09-15 | Discharge: 2024-09-15 | Payer: Self-pay | Attending: HEMATOLOGY-ONCOLOGY

## 2024-09-15 VITALS — BP 123/69 | HR 92 | Temp 97.9°F | Resp 18

## 2024-09-15 DIAGNOSIS — T451X5A Adverse effect of antineoplastic and immunosuppressive drugs, initial encounter: Secondary | ICD-10-CM

## 2024-09-15 MED ORDER — FILGRASTIM-SNDZ 480 MCG/0.8 ML INJECTION SYRINGE
5.0000 ug/kg | INJECTION | Freq: Once | INTRAMUSCULAR | Status: AC
  Administered 2024-09-15: 480 ug via SUBCUTANEOUS
  Filled 2024-09-15: qty 0.8

## 2024-09-15 NOTE — Nurses Notes (Signed)
 Patient arrived ambulatory for her injection.  Given in her left arm.  Tolerated well.

## 2024-09-16 ENCOUNTER — Encounter (HOSPITAL_COMMUNITY): Payer: Self-pay

## 2024-09-16 ENCOUNTER — Encounter (HOSPITAL_COMMUNITY): Payer: Self-pay | Admitting: HEMATOLOGY-ONCOLOGY

## 2024-09-16 ENCOUNTER — Ambulatory Visit: Admission: RE | Admit: 2024-09-16 | Discharge: 2024-09-16 | Payer: Self-pay | Attending: HEMATOLOGY-ONCOLOGY

## 2024-09-16 VITALS — BP 127/80 | HR 95 | Temp 99.6°F | Resp 15 | Ht 63.0 in | Wt 222.2 lb

## 2024-09-16 DIAGNOSIS — C50919 Malignant neoplasm of unspecified site of unspecified female breast: Secondary | ICD-10-CM

## 2024-09-16 LAB — MANUAL DIFFERENTIAL
EOSINOPHIL %: 3 % (ref 0–7)
EOSINOPHIL ABSOLUTE: 0.22 10*3/uL (ref 0.00–0.80)
EOSINOPHILS MANUAL: 3
LYMPHOCYTE %: 12 % — ABNORMAL LOW (ref 25–45)
LYMPHOCYTE ABSOLUTE: 0.88 10*3/uL — ABNORMAL LOW (ref 1.10–5.00)
LYMPHOCYTES MANUAL: 12
MONOCYTE %: 7 % (ref 0–12)
MONOCYTE ABSOLUTE: 0.51 10*3/uL — ABNORMAL HIGH (ref 0.00–0.30)
MONOCYTES MANUAL: 7
NEUTROPHIL %: 78 % — ABNORMAL HIGH (ref 40–76)
NEUTROPHIL ABSOLUTE: 5.69 10*3/uL (ref 1.80–8.40)
NEUTROPHILS MANUAL: 78
PLATELET MORPHOLOGY COMMENT: NORMAL
SCHISTOCYTES: ABSENT
TOTAL CELLS COUNTED [#] IN BLOOD: 100
WBC: 7.3 10*3/uL

## 2024-09-16 LAB — CBC WITH DIFF
HCT: 27.4 % — ABNORMAL LOW (ref 31.2–41.9)
HGB: 9.6 g/dL — ABNORMAL LOW (ref 10.9–14.3)
MCH: 36 pg — ABNORMAL HIGH (ref 24.7–32.8)
MCHC: 35 g/dL (ref 32.3–35.6)
MCV: 102.7 fL — ABNORMAL HIGH (ref 75.5–95.3)
MPV: 9.4 fL (ref 7.9–10.8)
PLATELETS: 166 10*3/uL (ref 140–440)
RBC: 2.67 10*6/uL — ABNORMAL LOW (ref 3.63–4.92)
RDW: 16.5 % (ref 12.3–17.7)
WBC: 7.3 10*3/uL (ref 3.8–11.8)

## 2024-09-16 MED ORDER — DEXTROSE 5% IN WATER (D5W) FLUSH BAG - 250 ML: INTRAVENOUS | Status: DC | PRN

## 2024-09-16 MED ORDER — DIPHENHYDRAMINE 50 MG/ML INJECTION SOLUTION: 25.0000 mg | Freq: Once | INTRAMUSCULAR | Status: DC | PRN

## 2024-09-16 MED ORDER — SODIUM CHLORIDE 0.9 % IV BOLUS: 500.0000 mL | INJECTION | Freq: Once | Status: DC | PRN

## 2024-09-16 MED ORDER — PACLITAXEL-PROTEIN BOUND IVPB
90.0000 mg/m2 | INJECTION | Freq: Once | INTRAVENOUS | Status: AC
  Administered 2024-09-16: 0 mg via INTRAVENOUS
  Administered 2024-09-16: 180 mg via INTRAVENOUS
  Filled 2024-09-16: qty 36

## 2024-09-16 MED ORDER — PROCHLORPERAZINE EDISYLATE 10 MG/2 ML (5 MG/ML) INJECTION SOLUTION: 10.0000 mg | Freq: Once | INTRAMUSCULAR | Status: DC | PRN

## 2024-09-16 MED ORDER — FAMOTIDINE (PF) 20 MG/2 ML INTRAVENOUS SOLUTION: 20.0000 mg | Freq: Once | INTRAVENOUS | Status: DC | PRN

## 2024-09-16 MED ORDER — HYDROCORTISONE SOD SUCCINATE 100 MG/2 ML VIAL WRAPPER: 100.0000 mg | Freq: Once | INTRAMUSCULAR | Status: DC | PRN

## 2024-09-16 MED ORDER — ALBUTEROL SULFATE 2.5 MG/3 ML (0.083 %) SOLUTION FOR NEBULIZATION: 2.5000 mg | INHALATION_SOLUTION | Freq: Once | RESPIRATORY_TRACT | Status: DC | PRN

## 2024-09-16 MED ORDER — ALBUTEROL SULFATE HFA 90 MCG/ACTUATION AEROSOL INHALER - RN: 2.0000 | Freq: Once | RESPIRATORY_TRACT | Status: DC | PRN

## 2024-09-16 MED ORDER — DIPHENHYDRAMINE 50 MG/ML INJECTION SOLUTION: 50.0000 mg | Freq: Once | INTRAMUSCULAR | Status: DC | PRN

## 2024-09-16 MED ORDER — SODIUM CHLORIDE 0.9% FLUSH BAG - 250 ML: INTRAVENOUS | Status: DC | PRN

## 2024-09-16 MED ORDER — EPINEPHRINE 1 MG/ML (1 ML) INJECTION SOLUTION: 0.3000 mg | Freq: Once | INTRAMUSCULAR | Status: DC | PRN

## 2024-09-16 MED ORDER — ONDANSETRON HCL 8 MG TABLET
16.0000 mg | ORAL_TABLET | Freq: Once | ORAL | Status: AC
  Administered 2024-09-16: 16 mg via ORAL
  Filled 2024-09-16: qty 2

## 2024-09-16 MED ORDER — MEPERIDINE (PF) 25 MG/ML INJECTION SOLUTION: 12.5000 mg | Freq: Once | INTRAMUSCULAR | Status: DC | PRN

## 2024-09-16 MED ORDER — PROCHLORPERAZINE MALEATE 10 MG TABLET: 10.0000 mg | ORAL_TABLET | Freq: Once | ORAL | Status: DC | PRN

## 2024-09-17 ENCOUNTER — Encounter (HOSPITAL_COMMUNITY): Payer: Self-pay | Admitting: HEMATOLOGY-ONCOLOGY

## 2024-09-30 ENCOUNTER — Ambulatory Visit (HOSPITAL_COMMUNITY): Payer: Self-pay

## 2024-09-30 ENCOUNTER — Ambulatory Visit (INDEPENDENT_AMBULATORY_CARE_PROVIDER_SITE_OTHER): Payer: Self-pay

## 2024-09-30 ENCOUNTER — Ambulatory Visit (INDEPENDENT_AMBULATORY_CARE_PROVIDER_SITE_OTHER): Payer: Self-pay | Admitting: HEMATOLOGY-ONCOLOGY

## 2024-10-01 ENCOUNTER — Ambulatory Visit: Attending: RADIATION ONCOLOGY | Admitting: RADIATION ONCOLOGY

## 2024-10-07 ENCOUNTER — Ambulatory Visit

## 2024-11-14 ENCOUNTER — Ambulatory Visit (HOSPITAL_COMMUNITY): Payer: Self-pay
